# Patient Record
Sex: Female | Born: 1940 | Race: Asian | Hispanic: No | Smoking: Never smoker
Health system: Southern US, Community
[De-identification: ages and names within clinical notes are randomized; demographics above are authoritative.]

## PROBLEM LIST (undated history)

## (undated) DIAGNOSIS — C50919 Malignant neoplasm of unspecified site of unspecified female breast: Secondary | ICD-10-CM

## (undated) DIAGNOSIS — I10 Essential (primary) hypertension: Secondary | ICD-10-CM

## (undated) DIAGNOSIS — C801 Malignant (primary) neoplasm, unspecified: Secondary | ICD-10-CM

## (undated) DIAGNOSIS — E119 Type 2 diabetes mellitus without complications: Secondary | ICD-10-CM

## (undated) DIAGNOSIS — H269 Unspecified cataract: Secondary | ICD-10-CM

## (undated) DIAGNOSIS — E785 Hyperlipidemia, unspecified: Secondary | ICD-10-CM

## (undated) DIAGNOSIS — E079 Disorder of thyroid, unspecified: Secondary | ICD-10-CM

## (undated) HISTORY — PX: EYE SURGERY: SHX253

## (undated) HISTORY — DX: Type 2 diabetes mellitus without complications: E11.9

## (undated) HISTORY — DX: Disorder of thyroid, unspecified: E07.9

## (undated) HISTORY — PX: BREAST SURGERY: SHX581

## (undated) HISTORY — DX: Essential (primary) hypertension: I10

## (undated) HISTORY — DX: Malignant neoplasm of unspecified site of unspecified female breast: C50.919

## (undated) HISTORY — DX: Hyperlipidemia, unspecified: E78.5

## (undated) HISTORY — DX: Malignant (primary) neoplasm, unspecified: C80.1

## (undated) HISTORY — DX: Unspecified cataract: H26.9

---

## 2017-06-27 ENCOUNTER — Ambulatory Visit: Payer: Self-pay | Admitting: Adult Health Nurse Practitioner

## 2017-06-27 DIAGNOSIS — Z0000 Encounter for general adult medical examination without abnormal findings: Secondary | ICD-10-CM

## 2017-06-27 DIAGNOSIS — E1169 Type 2 diabetes mellitus with other specified complication: Secondary | ICD-10-CM | POA: Insufficient documentation

## 2017-06-27 DIAGNOSIS — I10 Essential (primary) hypertension: Secondary | ICD-10-CM

## 2017-06-27 DIAGNOSIS — E119 Type 2 diabetes mellitus without complications: Secondary | ICD-10-CM | POA: Insufficient documentation

## 2017-06-27 DIAGNOSIS — Z853 Personal history of malignant neoplasm of breast: Secondary | ICD-10-CM

## 2017-06-27 DIAGNOSIS — K219 Gastro-esophageal reflux disease without esophagitis: Secondary | ICD-10-CM

## 2017-06-27 DIAGNOSIS — I152 Hypertension secondary to endocrine disorders: Secondary | ICD-10-CM | POA: Insufficient documentation

## 2017-06-27 DIAGNOSIS — E785 Hyperlipidemia, unspecified: Secondary | ICD-10-CM

## 2017-06-27 DIAGNOSIS — Z515 Encounter for palliative care: Secondary | ICD-10-CM | POA: Insufficient documentation

## 2017-06-27 MED ORDER — METFORMIN HCL 1000 MG PO TABS: 500.0000 mg | ORAL_TABLET | Freq: Two times a day (BID) | ORAL | 3 refills | Status: DC

## 2017-06-27 MED ORDER — PROPRANOLOL HCL 20 MG PO TABS: 20.0000 mg | ORAL_TABLET | Freq: Two times a day (BID) | ORAL | 3 refills | Status: DC

## 2017-06-27 MED ORDER — ATORVASTATIN CALCIUM 20 MG PO TABS: 20.0000 mg | ORAL_TABLET | Freq: Every day | ORAL | 3 refills | Status: DC

## 2017-06-27 MED ORDER — AMLODIPINE BESYLATE 5 MG PO TABS: 5.0000 mg | ORAL_TABLET | Freq: Every day | ORAL | 3 refills | Status: DC

## 2017-06-27 MED ORDER — RANITIDINE HCL 150 MG PO TABS: 150.0000 mg | ORAL_TABLET | Freq: Every day | ORAL | 2 refills | Status: DC

## 2017-06-27 NOTE — Progress Notes (Signed)
  Patient: Fabiola Mudgett Female    DOB: [birth year]   76 y.o.   MRN: [id number] Visit Date: 06/27/2017  Today's Provider: Staci Acosta, NP   No chief complaint on file.  Subjective:    HPI  Here to establish care.  No current complaints.  Taking medications as directed,   Denies dizziness, HA, CP or N/V/D.     No Known Allergies This SmartLink is deprecated. Use AVSMEDLIST instead to display the medication list for a patient.  Review of Systems  All other systems reviewed and are negative.   Social History   Tobacco Use  . Smoking status: Never Smoker  . Smokeless tobacco: Never Used  Substance Use Topics  . Alcohol use: No    Frequency: Never   Objective:   BP 130/65 (BP Location: Left Arm, Patient Position: Sitting)   Pulse 79   Temp 98.1 F (36.7 C) (Oral)   Wt 105 lb 4.8 oz (47.8 kg)   Physical Exam  Constitutional: She appears well-developed and well-nourished.  HENT:  Head: Normocephalic and atraumatic.  Neck: Normal range of motion. Neck supple.  Cardiovascular: Normal rate, regular rhythm, normal heart sounds and intact distal pulses.  Pulmonary/Chest: Effort normal and breath sounds normal.  Abdominal: Soft. Bowel sounds are normal.  Skin: Skin is warm and dry.  Vitals reviewed.       Assessment & Plan:     DM:  A1c today.  Encourage diabetic diet and exercise.  Continue current medication regimen.   HTN:  Controlled. .  Goal BP <140/90.  Continue current medication regimen.  Encourage low salt diet and exercise.   HLD:  Lipid panel today.  Continue current regimen.  Encourage low cholesterol, low fat diet and exercise.   GERD:  Continue current regimen.  Avoid triggers.   FU in 4 weeks for lab review and to review medications changes.   Pt was on clinidipime 10mg  BID (per family report this is a CCB)- unknown medication, replaced with Norvasc 5 QD which is also a CCB.   Propanolol was being dosed at 40mg  daily-  switched to 20mg  BID.        Referral to [facility] for Zoloft.   Staci Acosta, NP   [facility]

## 2017-06-28 LAB — COMPREHENSIVE METABOLIC PANEL
A/G RATIO: 1.4 (ref 1.2–2.2)
ALK PHOS: 45 IU/L (ref 39–117)
ALT: 21 IU/L (ref 0–32)
AST: 23 IU/L (ref 0–40)
Albumin: 4.1 g/dL (ref 3.5–4.8)
BUN/Creatinine Ratio: 15 (ref 12–28)
BUN: 12 mg/dL (ref 8–27)
CHLORIDE: 99 mmol/L (ref 96–106)
CO2: 25 mmol/L (ref 20–29)
Calcium: 9.8 mg/dL (ref 8.7–10.3)
Creatinine, Ser: 0.79 mg/dL (ref 0.57–1.00)
GFR calc Af Amer: 84 mL/min/{1.73_m2} (ref >59)
GFR, EST NON AFRICAN AMERICAN: 72 mL/min/{1.73_m2} (ref >59)
GLOBULIN, TOTAL: 3 g/dL (ref 1.5–4.5)
Glucose: 112 mg/dL — ABNORMAL HIGH (ref 65–99)
POTASSIUM: 5.5 mmol/L — AB (ref 3.5–5.2)
SODIUM: 135 mmol/L (ref 134–144)
Total Protein: 7.1 g/dL (ref 6.0–8.5)

## 2017-06-28 LAB — CBC
HEMATOCRIT: 37.4 % (ref 34.0–46.6)
Hemoglobin: 12.1 g/dL (ref 11.1–15.9)
MCH: 28.5 pg (ref 26.6–33.0)
MCHC: 32.4 g/dL (ref 31.5–35.7)
MCV: 88 fL (ref 79–97)
Platelets: 318 10*3/uL (ref 150–379)
RBC: 4.24 x10E6/uL (ref 3.77–5.28)
RDW: 13.3 % (ref 12.3–15.4)
WBC: 10.4 10*3/uL (ref 3.4–10.8)

## 2017-06-28 LAB — LIPID PANEL
CHOL/HDL RATIO: 3 ratio (ref 0.0–4.4)
CHOLESTEROL TOTAL: 133 mg/dL (ref 100–199)
HDL: 45 mg/dL (ref >39)
LDL Calculated: 56 mg/dL (ref 0–99)
TRIGLYCERIDES: 158 mg/dL — AB (ref 0–149)
VLDL Cholesterol Cal: 32 mg/dL (ref 5–40)

## 2017-06-28 LAB — TSH: TSH: 5.09 u[IU]/mL — AB (ref 0.450–4.500)

## 2017-06-28 LAB — HEMOGLOBIN A1C
Est. average glucose Bld gHb Est-mCnc: 123 mg/dL
Hgb A1c MFr Bld: 5.9 % — ABNORMAL HIGH (ref 4.8–5.6)

## 2017-07-25 ENCOUNTER — Ambulatory Visit: Payer: Medicaid Other | Admitting: Family Medicine

## 2017-07-25 VITALS — BP 126/64 | HR 81 | Temp 98.2°F | Wt 104.2 lb

## 2017-07-25 DIAGNOSIS — E039 Hypothyroidism, unspecified: Secondary | ICD-10-CM

## 2017-07-25 DIAGNOSIS — E1169 Type 2 diabetes mellitus with other specified complication: Secondary | ICD-10-CM

## 2017-07-25 DIAGNOSIS — Z09 Encounter for follow-up examination after completed treatment for conditions other than malignant neoplasm: Secondary | ICD-10-CM

## 2017-07-25 DIAGNOSIS — I10 Essential (primary) hypertension: Secondary | ICD-10-CM

## 2017-07-25 DIAGNOSIS — E785 Hyperlipidemia, unspecified: Secondary | ICD-10-CM

## 2017-07-25 DIAGNOSIS — E119 Type 2 diabetes mellitus without complications: Secondary | ICD-10-CM

## 2017-07-25 MED ORDER — LEVOTHYROXINE SODIUM 25 MCG PO TABS: ORAL_TABLET | ORAL | 1 refills | Status: DC

## 2017-07-25 NOTE — Progress Notes (Signed)
  Patient: Erin Wagner Female    DOB: 1941-02-28   77 y.o.   MRN: [id number] Visit Date: 07/25/2017  Today's Provider: Bertha   Chief Complaint  Patient presents with  . Follow-up    blood test results   Subjective:    HPI   Lab results. Patient has no complaints at this time.     No Known Allergies Previous Medications   AMLODIPINE (NORVASC) 5 MG TABLET    Take 1 tablet (5 mg total) by mouth daily.   ATORVASTATIN (LIPITOR) 20 MG TABLET    Take 1 tablet (20 mg total) by mouth daily.   METFORMIN (GLUCOPHAGE) 1000 MG TABLET    Take 0.5 tablets (500 mg total) by mouth 2 (two) times daily with a meal.   PROPRANOLOL (INDERAL) 20 MG TABLET    Take 1 tablet (20 mg total) by mouth 2 (two) times daily.   RANITIDINE (ZANTAC) 150 MG TABLET    Take 1 tablet (150 mg total) by mouth at bedtime.   SERTRALINE (ZOLOFT) 25 MG TABLET    Take 1 tablet (25 mg total) by mouth daily.    Review of Systems  All other systems reviewed and are negative.   Social History   Tobacco Use  . Smoking status: Never Smoker  . Smokeless tobacco: Never Used  Substance Use Topics  . Alcohol use: No    Frequency: Never   Objective:   BP 126/64 (BP Location: Left Arm, Patient Position: Sitting, Cuff Size: Normal)   Pulse 81   Temp 98.2 F (36.8 C)   Wt 104 lb 3.2 oz (47.3 kg)   Physical Exam  Constitutional: She is oriented to person, place, and time. She appears well-developed and well-nourished.  HENT:  Head: Normocephalic and atraumatic.  Right Ear: External ear normal.  Left Ear: External ear normal.  Mouth/Throat: Oropharynx is clear and moist.  Eyes: Conjunctivae and EOM are normal. Pupils are equal, round, and reactive to light.  Neck: Normal range of motion. Neck supple.  Cardiovascular: Normal rate, regular rhythm, normal heart sounds and intact distal pulses.  Pulmonary/Chest: Effort normal and breath sounds normal.  Abdominal: Soft. Bowel sounds are normal.   Musculoskeletal: Normal range of motion.  Neurological: She is alert and oriented to person, place, and time. She has normal reflexes.  Skin: Skin is warm and dry.  Psychiatric: She has a normal mood and affect. Her behavior is normal. Judgment and thought content normal.       Assessment & Plan:   1. Essential hypertension BP is 126/64 today. Continue BP meds as directed.   2. Hypothyroidism, unspecified type Rx for Synthroid 12.4 mcg daily to pharmacy. Monitor in 1 month.  - TSH  3. Hyperlipidemia associated with type 2 diabetes mellitus (HCC) Stable. Continue Lipitor as directed. Monitor.   4. Type 2 diabetes mellitus without complication, without long-term current use of insulin (HCC) Continue Glucophage as directed.   5. Follow up Follow up in 3 months.     [facility]

## 2017-08-27 ENCOUNTER — Other Ambulatory Visit: Payer: Medicaid Other

## 2017-08-27 DIAGNOSIS — E119 Type 2 diabetes mellitus without complications: Secondary | ICD-10-CM

## 2017-08-28 LAB — TSH: TSH: 3.95 u[IU]/mL (ref 0.450–4.500)

## 2017-09-03 ENCOUNTER — Ambulatory Visit: Payer: Medicaid Other | Admitting: Urology

## 2017-09-03 ENCOUNTER — Ambulatory Visit: Payer: Medicaid Other | Admitting: Family Medicine

## 2017-09-03 VITALS — BP 126/62 | HR 79 | Temp 98.0°F | Wt 106.3 lb

## 2017-09-03 DIAGNOSIS — E119 Type 2 diabetes mellitus without complications: Secondary | ICD-10-CM

## 2017-09-03 LAB — GLUCOSE, POCT (MANUAL RESULT ENTRY): POC GLUCOSE: 94 mg/dL (ref 70–99)

## 2017-09-03 MED ORDER — PROPRANOLOL HCL 20 MG PO TABS: 20.0000 mg | ORAL_TABLET | Freq: Two times a day (BID) | ORAL | 3 refills | Status: DC

## 2017-09-03 MED ORDER — LEVOTHYROXINE SODIUM 25 MCG PO TABS: ORAL_TABLET | ORAL | 1 refills | Status: DC

## 2017-09-03 MED ORDER — ATORVASTATIN CALCIUM 20 MG PO TABS: 20.0000 mg | ORAL_TABLET | Freq: Every day | ORAL | 3 refills | Status: DC

## 2017-09-03 MED ORDER — AMLODIPINE BESYLATE 5 MG PO TABS: 5.0000 mg | ORAL_TABLET | Freq: Every day | ORAL | 3 refills | Status: DC

## 2017-09-03 MED ORDER — RANITIDINE HCL 150 MG PO TABS: 150.0000 mg | ORAL_TABLET | Freq: Every day | ORAL | 2 refills | Status: DC

## 2017-09-03 MED ORDER — METFORMIN HCL 1000 MG PO TABS: 500.0000 mg | ORAL_TABLET | Freq: Two times a day (BID) | ORAL | 3 refills | Status: DC

## 2017-09-03 MED ORDER — CETIRIZINE HCL 10 MG PO TABS: 10.0000 mg | ORAL_TABLET | Freq: Every day | ORAL | 1 refills | Status: DC

## 2017-09-03 NOTE — Progress Notes (Signed)
   Subjective:    Patient ID: Alanya Vukelich, female    DOB: 08/22/1940, 77 y.o.   MRN: [id number]  HPI   Carlyn Lemke is a 77 yo female here for 1 mo f/u for lab results. Pt is here with family member who is interpreting.  Pt presents with complaint of cold with cough. Denies fevers. Family member reports it's probably allergy related b/c her previous allergy med was from [place].  Pt reports reflux is fine.   Pt needs med refill.  Pt did not go to [facility] for depression symptoms in Dec 2018. Family member says Pt was depressed from being away from home but she is doing better and does not need Zoloft rx  Patient Active Problem List   Diagnosis Date Noted  . Health care maintenance 06/27/2017  . History of breast cancer 06/27/2017  . Diabetes mellitus without complication ([place]) 62/56/3893  . Hyperlipidemia associated with type 2 diabetes mellitus ([place]) 06/27/2017  . GERD (gastroesophageal reflux disease) 06/27/2017  . Hypertension 06/27/2017   Allergies as of 09/03/2017   No Known Allergies     Medication List        Accurate as of 09/03/17  7:30 PM. Always use your most recent med list.          amLODipine 5 MG tablet Commonly known as:  NORVASC Take 1 tablet (5 mg total) by mouth daily.   atorvastatin 20 MG tablet Commonly known as:  LIPITOR Take 1 tablet (20 mg total) by mouth daily.   levothyroxine 25 MCG tablet Commonly known as:  SYNTHROID, LEVOTHROID Take 1/2 tablet daily   metFORMIN 1000 MG tablet Commonly known as:  GLUCOPHAGE Take 0.5 tablets (500 mg total) by mouth 2 (two) times daily with a meal.   propranolol 20 MG tablet Commonly known as:  INDERAL Take 1 tablet (20 mg total) by mouth 2 (two) times daily.   ranitidine 150 MG tablet Commonly known as:  ZANTAC Take 1 tablet (150 mg total) by mouth at bedtime.   ZOLOFT 25 MG tablet Generic drug:  sertraline Take 1 tablet (25 mg total) by mouth daily.        Review of Systems Reviewed  labs. TSH is at target.  Pt is taking all meds as rx.  Diabetes is controlled.     Objective:   Physical Exam  Constitutional: She is oriented to person, place, and time. She appears well-developed and well-nourished.  Cardiovascular: Normal rate, regular rhythm and normal heart sounds.  Pulmonary/Chest: Effort normal and breath sounds normal.  Neurological: She is alert and oriented to person, place, and time.  Vitals reviewed.   BP 126/62   Pulse 79   Temp 98 F (36.7 C)   Wt 106 lb 4.8 oz (48.2 kg)   Lab Results  Component Value Date   POCGLU 94 09/03/2017        Assessment & Plan:   Refilled meds. Ordered Zyrtec 10mg .  F/u in 1 mo with labs: A1c,

## 2017-09-05 ENCOUNTER — Other Ambulatory Visit: Payer: Medicaid Other

## 2017-09-09 ENCOUNTER — Ambulatory Visit (INDEPENDENT_AMBULATORY_CARE_PROVIDER_SITE_OTHER): Payer: Medicaid Other | Admitting: Family Medicine

## 2017-09-09 ENCOUNTER — Encounter: Payer: Self-pay | Admitting: Family Medicine

## 2017-09-09 VITALS — BP 130/60 | HR 76 | Temp 97.9°F | Resp 16 | Ht <= 58 in | Wt 104.8 lb

## 2017-09-09 DIAGNOSIS — Z8669 Personal history of other diseases of the nervous system and sense organs: Secondary | ICD-10-CM

## 2017-09-09 DIAGNOSIS — M25422 Effusion, left elbow: Secondary | ICD-10-CM

## 2017-09-09 DIAGNOSIS — E119 Type 2 diabetes mellitus without complications: Secondary | ICD-10-CM

## 2017-09-09 DIAGNOSIS — E785 Hyperlipidemia, unspecified: Secondary | ICD-10-CM

## 2017-09-09 DIAGNOSIS — K219 Gastro-esophageal reflux disease without esophagitis: Secondary | ICD-10-CM | POA: Diagnosis not present

## 2017-09-09 DIAGNOSIS — Z853 Personal history of malignant neoplasm of breast: Secondary | ICD-10-CM | POA: Diagnosis not present

## 2017-09-09 DIAGNOSIS — I10 Essential (primary) hypertension: Secondary | ICD-10-CM | POA: Diagnosis not present

## 2017-09-09 DIAGNOSIS — F329 Major depressive disorder, single episode, unspecified: Secondary | ICD-10-CM

## 2017-09-09 DIAGNOSIS — E1169 Type 2 diabetes mellitus with other specified complication: Secondary | ICD-10-CM | POA: Diagnosis not present

## 2017-09-09 DIAGNOSIS — F32A Depression, unspecified: Secondary | ICD-10-CM | POA: Insufficient documentation

## 2017-09-09 NOTE — Assessment & Plan Note (Signed)
Well-controlled Continue current medications Recent CMP reviewed Follow-up in 3 months

## 2017-09-09 NOTE — Assessment & Plan Note (Signed)
Well-controlled Last lipid panel reviewed and LDL less than 70 Continue atorvastatin 20 mg daily

## 2017-09-09 NOTE — Assessment & Plan Note (Signed)
Does have some tenderness over medial could represent medial epicondylitis, but swelling is in an unusual pattern for this She does have a mobile mass in her medial anterior elbow. Check ultrasound to evaluate further Pending results, consider further imaging or referral Advised patient and her daughter that it is okay to take NSAIDs or Tylenol as needed for pain and ice regularly

## 2017-09-09 NOTE — Assessment & Plan Note (Signed)
Well-controlled, last A1c 5.9 Continue metformin Foot exam and urine microalbumin at next visit Referral to ophthalmology for eye exam We will update vaccines at next visit Follow-up in 3 months

## 2017-09-09 NOTE — Assessment & Plan Note (Signed)
Well-controlled currently Continue ranitidine

## 2017-09-09 NOTE — Assessment & Plan Note (Signed)
Doing well PHQ 9 score of 0 Continue low-dose Zoloft

## 2017-09-09 NOTE — Progress Notes (Signed)
Patient: Erin Wagner, Female    DOB: 25-Jun-1941, 77 y.o.   MRN: [id number] Visit Date: 09/09/2017  Today's Provider: Lavon Paganini, MD   I, Erin Wagner, CMA, am acting as scribe for Erin Paganini, MD.  Chief Complaint  Patient presents with  . Establish Care   Subjective:    Erin Wagner is a 77 y.o. female who presents today for health maintenance and to establish care. She feels fairly well. She is experiencing swelling of her left antecubital space. She reports exercising none. She reports she is sleeping fairly well.  Erin Wagner was previously seen by [facility] for her primary care. Her PMH includes type 2 DM, HTN, hyperlipidemia, GERD, hypothyroidism.  Reviewed recent labs from [facility], which reveals normal Cr, LFTs, CBC, TSH, cholesterol.  Her preferred language is [place]. She declines phone interpreter, and daughter is present for interpretation. Daughter helps provide some of the history. ----------------------------------------------------------------- Treated for breast cancer in [place]. 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  Graddaughter is a physician and sent in summary of treatment (see photo below) which included chemo, radiation, and mastectomy.  She is currently on Tamoxifen daily.      Moved from [place] one year ago.  Swelling in L medial elbow: Started 1 wk ago. Sometimes painful but not always.  Nothing makes it worse or better.  Doesn't restrict motion or strength. Hasnt tried medications/ice. No fevers, redness, other lesions, growth.  T2DM: Last A1c 5.9. Taking Metformin 500mg  BID. Denies symptoms of hypoglycemia, polyuria, polydipsia, numbness extremities, foot ulcers/trauma. Denies side effects of metformin.  Does not have an ophthalmologist.  Not checking CBGs at home.  HTN: - Medications: amlodipine 5mg  daily, propranolol 20mg  BID - Compliance:  good - Checking BP at home: no - Denies any SOB, CP, vision changes, LE edema, medication SEs, or symptoms of hypotension - Diet: low sodium  Hypothyroidism: Taking Synthroid 12.51mcg daily.  Last TSH wnl.  Denies hair loss, dry skin, constipaiton/diarrhea, heat/cold intolerance.  HLD: Taking Atorvastatin 20mg  daily without complications. Last LDL <70.  GERD: Reportedly well controlled on Ranitidine  Depression: Denies any anhedonia or depression. Taking Zoloft without issue. Has been widowed for many years.    Review of Systems  Constitutional: Negative.   HENT: Negative.   Eyes: Negative.   Respiratory: Negative.   Cardiovascular: Negative.   Gastrointestinal: Negative.   Endocrine: Negative.   Genitourinary: Negative.   Musculoskeletal: Negative.   Skin: Negative.   Allergic/Immunologic: Negative.   Neurological: Negative.   Hematological: Negative.   Psychiatric/Behavioral: Negative.     Social History      She  reports that  has never smoked. she has never used smokeless tobacco. She reports that she does not drink alcohol or use drugs.       Social History   Socioeconomic History  . Marital status: Widowed    Spouse name: None  . Number of children: 4  . Years of education: None  . Highest education level: None  Social Needs  . Financial resource strain: None  . Food insecurity - worry: None  . Food insecurity - inability: None  . Transportation needs - medical: None  . Transportation needs - non-medical: None  Occupational History    Employer: retired    Comment: homemaker  Tobacco Use  . Smoking status: Never Smoker  . Smokeless tobacco: Never Used  Substance and Sexual Activity  .  Alcohol use: No    Frequency: Never  . Drug use: No  . Sexual activity: None  Other Topics Concern  . None  Social History Narrative  . None    Past Medical History:  Diagnosis Date  . Cancer ([place])    breast  . Cataract   . Diabetes mellitus without complication  ([place])   . Hyperlipidemia   . Hypertension   . Thyroid disease      Patient Active Problem List   Diagnosis Date Noted  . Swelling of left elbow 09/09/2017  . History of cataract 09/09/2017  . Depression 09/09/2017  . Health care maintenance 06/27/2017  . History of breast cancer 06/27/2017  . Diabetes mellitus without complication ([place]) 40/98/1191  . Hyperlipidemia associated with type 2 diabetes mellitus ([place]) 06/27/2017  . GERD (gastroesophageal reflux disease) 06/27/2017  . Hypertension 06/27/2017    Past Surgical History:  Procedure Laterality Date  . BREAST SURGERY    . EYE SURGERY      Family History        Family Status  Relation Name Status  . Mother  Deceased  . Father  Deceased  . Sister  Alive  . Brother  Alive  . Daughter  Alive  . Son  Alive  . Sister  Alive  . Daughter  Alive  . Daughter  Alive        Her family history includes Healthy in her brother, daughter, daughter, daughter, sister, and sister.      No Known Allergies   Current Outpatient Medications:  .  amLODipine (NORVASC) 5 MG tablet, Take 1 tablet (5 mg total) by mouth daily., Disp: 30 tablet, Rfl: 3 .  atorvastatin (LIPITOR) 20 MG tablet, Take 1 tablet (20 mg total) by mouth daily., Disp: 30 tablet, Rfl: 3 .  levothyroxine (SYNTHROID, LEVOTHROID) 25 MCG tablet, Take 1/2 tablet daily, Disp: 15 tablet, Rfl: 1 .  metFORMIN (GLUCOPHAGE) 1000 MG tablet, Take 0.5 tablets (500 mg total) by mouth 2 (two) times daily with a meal., Disp: 60 tablet, Rfl: 3 .  propranolol (INDERAL) 20 MG tablet, Take 1 tablet (20 mg total) by mouth 2 (two) times daily., Disp: 30 tablet, Rfl: 3 .  ranitidine (ZANTAC) 150 MG tablet, Take 1 tablet (150 mg total) by mouth at bedtime., Disp: 30 tablet, Rfl: 2 .  sertraline (ZOLOFT) 25 MG tablet, Take 25 mg by mouth daily., Disp: , Rfl:  .  tamoxifen (NOLVADEX) 20 MG tablet, Take 20 mg by mouth daily., Disp: , Rfl:  .  cetirizine (ZYRTEC) 10 MG tablet, Take 1 tablet (10  mg total) by mouth daily. (Patient not taking: Reported on 09/09/2017), Disp: 30 tablet, Rfl: 1   Patient Care Team: Virginia Crews, MD as PCP - General (Family Medicine)      Objective:   Vitals: BP 130/60 (BP Location: Left Arm, Patient Position: Sitting, Cuff Size: Normal)   Pulse 76   Temp 97.9 F (36.6 C) (Oral)   Resp 16   Ht 4\' 6"  (1.372 m)   Wt 104 lb 12.8 oz (47.5 kg)   SpO2 97%   BMI 25.27 kg/m    Vitals:   09/09/17 0907  BP: 130/60  Pulse: 76  Resp: 16  Temp: 97.9 F (36.6 C)  TempSrc: Oral  SpO2: 97%  Weight: 104 lb 12.8 oz (47.5 kg)  Height: 4\' 6"  (1.372 m)     Physical Exam  Constitutional: She is oriented to person, place, and time. She appears  well-developed and well-nourished. No distress.  HENT:  Head: Normocephalic and atraumatic.  Right Ear: External ear normal.  Left Ear: External ear normal.  Nose: Nose normal.  Mouth/Throat: Oropharynx is clear and moist.  Eyes: Conjunctivae and EOM are normal. Pupils are equal, round, and reactive to light. No scleral icterus.  Neck: Neck supple. No thyromegaly present.  Cardiovascular: Normal rate, regular rhythm, normal heart sounds and intact distal pulses.  No murmur heard. Pulmonary/Chest: Breath sounds normal. No respiratory distress. She has no wheezes. She has no rales.  Abdominal: Soft. Bowel sounds are normal. She exhibits no distension. There is no tenderness. There is no rebound and no guarding.  Musculoskeletal: She exhibits no edema.  Left elbow: Range of motion and strength intact.  Sensation intact to light touch.  3 cm round raised, mobile lesion over medial anterior left elbow.  Some tenderness to palpation over medial epicondyles, but minimal.  No tenderness to palpation over mass.  Lymphadenopathy:    She has no cervical adenopathy.  Neurological: She is alert and oriented to person, place, and time.  Skin: Skin is warm and dry. No rash noted.  Psychiatric: She has a normal mood and  affect. Her behavior is normal.  Vitals reviewed.    Depression Screen PHQ 2/9 Scores 09/09/2017  PHQ - 2 Score 0      Assessment & Plan:    Problem List Items Addressed This Visit      Cardiovascular and Mediastinum   Hypertension - Primary    Well-controlled Continue current medications Recent CMP reviewed Follow-up in 3 months        Digestive   GERD (gastroesophageal reflux disease)    Well-controlled currently Continue ranitidine        Endocrine   Diabetes mellitus without complication ([place])    Well-controlled, last A1c 5.9 Continue metformin Foot exam and urine microalbumin at next visit Referral to ophthalmology for eye exam We will update vaccines at next visit Follow-up in 3 months      Relevant Orders   Ambulatory referral to Ophthalmology   Hyperlipidemia associated with type 2 diabetes mellitus ([place])    Well-controlled Last lipid panel reviewed and LDL less than 70 Continue atorvastatin 20 mg daily        Other   History of breast cancer    From family's information, it seems that patient was treated in [place] with bilateral mastectomies and chemo and radiation She is on tamoxifen and presumably in remission, but has not seen an oncologist since she moved to the [place] about 1 year ago We will refer to oncology for further management and follow-up As she is on tamoxifen, she likely needs a bone density scan      Relevant Orders   Ambulatory referral to Oncology   Swelling of left elbow    Does have some tenderness over medial could represent medial epicondylitis, but swelling is in an unusual pattern for this She does have a mobile mass in her medial anterior elbow. Check ultrasound to evaluate further Pending results, consider further imaging or referral Advised patient and her daughter that it is okay to take NSAIDs or Tylenol as needed for pain and ice regularly      Relevant Orders   US SOFT TISSUE UPPER EXTREMITY LIMITED LEFT  (NON-VASCULAR)   History of cataract   Relevant Orders   Ambulatory referral to Ophthalmology   Depression    Doing well PHQ 9 score of 0 Continue low-dose Zoloft  Relevant Medications   sertraline (ZOLOFT) 25 MG tablet       Return in about 3 months (around 12/10/2017) for diabetes, HTN, HLD f/u.   The entirety of the information documented in the History of Present Illness, Review of Systems and Physical Exam were personally obtained by me. Portions of this information were initially documented by Raquel Sarna Ratchford, CMA and reviewed by me for thoroughness and accuracy.    Virginia Crews, MD, MPH [facility] 09/09/2017 10:48 AM

## 2017-09-09 NOTE — Assessment & Plan Note (Signed)
From family's information, it seems that patient was treated in [place] with bilateral mastectomies and chemo and radiation She is on tamoxifen and presumably in remission, but has not seen an oncologist since she moved to the [place] about 1 year ago We will refer to oncology for further management and follow-up As she is on tamoxifen, she likely needs a bone density scan

## 2017-09-12 ENCOUNTER — Other Ambulatory Visit: Payer: Medicaid Other

## 2017-09-12 ENCOUNTER — Ambulatory Visit
Admission: RE | Admit: 2017-09-12 | Discharge: 2017-09-12 | Disposition: A | Discharge status: Home / Self Care | Payer: Medicaid Other | Source: Ambulatory Visit | Attending: Family Medicine | Admitting: Family Medicine

## 2017-09-12 DIAGNOSIS — M25422 Effusion, left elbow: Secondary | ICD-10-CM | POA: Diagnosis present

## 2017-09-12 DIAGNOSIS — M7989 Other specified soft tissue disorders: Secondary | ICD-10-CM | POA: Insufficient documentation

## 2017-09-12 DIAGNOSIS — Z9013 Acquired absence of bilateral breasts and nipples: Secondary | ICD-10-CM | POA: Diagnosis not present

## 2017-09-19 ENCOUNTER — Ambulatory Visit: Payer: Medicaid Other

## 2017-09-20 ENCOUNTER — Ambulatory Visit: Payer: Self-pay | Admitting: Oncology

## 2017-09-24 ENCOUNTER — Inpatient Hospital Stay: Payer: Medicaid Other | Attending: Oncology | Admitting: Oncology

## 2017-09-24 ENCOUNTER — Encounter: Payer: Self-pay | Admitting: Oncology

## 2017-09-24 VITALS — BP 122/67 | HR 79 | Temp 97.3°F | Resp 18 | Ht <= 58 in | Wt 105.9 lb

## 2017-09-24 DIAGNOSIS — E785 Hyperlipidemia, unspecified: Secondary | ICD-10-CM | POA: Diagnosis not present

## 2017-09-24 DIAGNOSIS — K219 Gastro-esophageal reflux disease without esophagitis: Secondary | ICD-10-CM

## 2017-09-24 DIAGNOSIS — Z780 Asymptomatic menopausal state: Secondary | ICD-10-CM

## 2017-09-24 DIAGNOSIS — E119 Type 2 diabetes mellitus without complications: Secondary | ICD-10-CM

## 2017-09-24 DIAGNOSIS — Z79899 Other long term (current) drug therapy: Secondary | ICD-10-CM | POA: Diagnosis not present

## 2017-09-24 DIAGNOSIS — Z9012 Acquired absence of left breast and nipple: Secondary | ICD-10-CM | POA: Insufficient documentation

## 2017-09-24 DIAGNOSIS — Z853 Personal history of malignant neoplasm of breast: Secondary | ICD-10-CM | POA: Diagnosis present

## 2017-09-24 DIAGNOSIS — Z7984 Long term (current) use of oral hypoglycemic drugs: Secondary | ICD-10-CM | POA: Insufficient documentation

## 2017-09-24 DIAGNOSIS — R609 Edema, unspecified: Secondary | ICD-10-CM | POA: Diagnosis not present

## 2017-09-24 DIAGNOSIS — C50911 Malignant neoplasm of unspecified site of right female breast: Secondary | ICD-10-CM

## 2017-09-24 DIAGNOSIS — I10 Essential (primary) hypertension: Secondary | ICD-10-CM | POA: Diagnosis not present

## 2017-09-24 DIAGNOSIS — F329 Major depressive disorder, single episode, unspecified: Secondary | ICD-10-CM | POA: Diagnosis not present

## 2017-09-24 DIAGNOSIS — Z9221 Personal history of antineoplastic chemotherapy: Secondary | ICD-10-CM | POA: Insufficient documentation

## 2017-09-24 NOTE — Progress Notes (Signed)
Hematology/Oncology Consult note [facility] Telephone:([phone] [phone]  Patient Care Team: Virginia Crews, MD as PCP - General (Family Medicine)   Name of the patient: Erin Wagner  [id number]  1941-04-09    Reason for referral- h/o b/l breast cancer treated in [place]   Referring physician- Dr. Brita Romp  Date of visit: 09/24/17   History of presenting illness- Patient is a 77 year old [place]  speaking female from [place] with a past medical history significant for hypertension hyperlipidemia, diabetes and bilateral breast cancer.  She is here with son and daughter in law. She has refused formal language interpretor and would like her family to interpret for her. Her family is comfortable speaking in Hindi with me and translating for her.  Her oncology history is as follows:  1.  She was diagnosed with bilateral breast cancer in July 2017 in [place].  She initially received neoadjuvant chemotherapy with 3 cycles of epirubicin and cyclophosphamide.  This was followed by bilateral modified radical mastectomy.  I do not have the actual pathology report from [place].  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  I am unable to decipher the written handwriting well.  Appears to have 1 out of 13 lymph nodes positive with PNE positive, no LV I or perineural invasion.  Left breast mass showed 6 cm mixed infiltrating ductal and lobular histology.  Again handwritten notes mention 3 out of 16 lymph nodes positive.  In some other page on the outside report, the right breast cancer has been mentioned as T2 N0 in the left breast cancer has been mentioned as T4N0.  2.  Following surgery she had 1 cycle of epirubicin and cyclophosphamide and 2 cycles of doxorubicin.  She then had IMRT to her bilateral breasts over 5 weeks.  She was then asked to take  tamoxifen for 6 months only.  There is no mention about ER PR and HER-2 status anywhere in the outside reports. Patient was asked to take tamoxifen for 6 months following radiation and then stop it. She is currently not taking any hormonal therapy  3. 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 Currently patients has relocated to [place] and plans to stay in [place]. She reports doing well. She has occasional pain in her left axilla. She denies other complaints. Her appetite good. No unintentional weight loss. She is independent of her ADL's.   ECOG PS- 1  Pain scale- 0   Review of systems- Review of Systems  Constitutional: Negative for chills, fever, malaise/fatigue and weight loss.  HENT: Negative for congestion, ear discharge and nosebleeds.   Eyes: Negative for blurred vision.  Respiratory: Negative for cough, hemoptysis, sputum production, shortness of breath and wheezing.   Cardiovascular: Negative for chest pain, palpitations, orthopnea and claudication.  Gastrointestinal: Negative for abdominal pain, blood in stool, constipation, diarrhea, heartburn, melena, nausea and vomiting.  Genitourinary: Negative for dysuria, flank pain, frequency, hematuria and urgency.  Musculoskeletal: Negative for back pain, joint pain and myalgias.  Skin: Negative for rash.  Neurological: Negative for dizziness, tingling, focal weakness, seizures, weakness and headaches.  Endo/Heme/Allergies: Does not bruise/bleed easily.  Psychiatric/Behavioral: Negative for depression and suicidal ideas. The patient does not have insomnia.     No Known Allergies  Patient  Active Problem List   Diagnosis Date Noted  . Swelling of left elbow 09/09/2017  . History of cataract 09/09/2017  . Depression 09/09/2017  . Health care maintenance 06/27/2017  . History of breast cancer  06/27/2017  . Diabetes mellitus without complication ([place]) 18/84/1660  . Hyperlipidemia associated with type 2 diabetes mellitus ([place]) 06/27/2017  . GERD (gastroesophageal reflux disease) 06/27/2017  . Hypertension 06/27/2017     Past Medical History:  Diagnosis Date  . Cancer ([place])    breast  . Cataract   . Diabetes mellitus without complication ([place])   . Hyperlipidemia   . Hypertension   . Thyroid disease      Past Surgical History:  Procedure Laterality Date  . BREAST SURGERY    . EYE SURGERY      Social History   Socioeconomic History  . Marital status: Widowed    Spouse name: Not on file  . Number of children: 4  . Years of education: Not on file  . Highest education level: Not on file  Social Needs  . Financial resource strain: Not on file  . Food insecurity - worry: Not on file  . Food insecurity - inability: Not on file  . Transportation needs - medical: Not on file  . Transportation needs - non-medical: Not on file  Occupational History    Employer: retired    Comment: homemaker  Tobacco Use  . Smoking status: Never Smoker  . Smokeless tobacco: Never Used  Substance and Sexual Activity  . Alcohol use: No    Frequency: Never  . Drug use: No  . Sexual activity: Not on file  Other Topics Concern  . Not on file  Social History Narrative  . Not on file     Family History  Problem Relation Age of Onset  . Healthy Sister   . Healthy Brother   . Healthy Daughter   . Healthy Sister   . Healthy Daughter   . Healthy Daughter      Current Outpatient Medications:  .  amLODipine (NORVASC) 5 MG tablet, Take 1 tablet (5 mg total) by mouth daily., Disp: 30 tablet, Rfl: 3 .  atorvastatin (LIPITOR) 20 MG tablet, Take 1 tablet (20 mg total) by mouth daily., Disp: 30 tablet, Rfl: 3 .  cetirizine (ZYRTEC) 10 MG tablet, Take 1 tablet (10 mg total) by mouth daily., Disp: 30 tablet, Rfl: 1 .  levothyroxine (SYNTHROID, LEVOTHROID) 25 MCG tablet, Take 1/2 tablet  daily, Disp: 15 tablet, Rfl: 1 .  metFORMIN (GLUCOPHAGE) 1000 MG tablet, Take 0.5 tablets (500 mg total) by mouth 2 (two) times daily with a meal., Disp: 60 tablet, Rfl: 3 .  propranolol (INDERAL) 20 MG tablet, Take 1 tablet (20 mg total) by mouth 2 (two) times daily., Disp: 30 tablet, Rfl: 3 .  ranitidine (ZANTAC) 150 MG tablet, Take 1 tablet (150 mg total) by mouth at bedtime. (Patient not taking: Reported on 09/24/2017), Disp: 30 tablet, Rfl: 2 .  sertraline (ZOLOFT) 25 MG tablet, Take 25 mg by mouth daily., Disp: , Rfl:  .  tamoxifen (NOLVADEX) 20 MG tablet, Take 20 mg by mouth daily., Disp: , Rfl:    Physical exam:  Vitals:   09/24/17 1123  BP: 122/67  Pulse: 79  Resp: 18  Temp: (!) 97.3 F (36.3 C)  TempSrc: Tympanic  Weight: 105 lb 14.4 oz (48 kg)  Height: _0  (1.372 m)   Physical Exam  Constitutional: She is oriented to person, place, and time  and well-developed, well-nourished, and in no distress.  HENT:  Head: Normocephalic and atraumatic.  Eyes: EOM are normal. Pupils are equal, round, and reactive to light.  Neck: Normal range of motion.  Cardiovascular: Normal rate, regular rhythm and normal heart sounds.  Pulmonary/Chest: Effort normal and breath sounds normal.  Abdominal: Soft. Bowel sounds are normal.  Neurological: She is alert and oriented to person, place, and time.  Skin: Skin is warm and dry.   Breast exam is performed in seated and lying down position. Patient is status post bilateral mastectomy without reconstruction. No evidence of any chest wall recurrence. No evidence of bilateral axillary adenopathy      CMP Latest Ref Rng & Units 06/27/2017  Glucose 65 - 99 mg/dL 112(H)  BUN 8 - 27 mg/dL 12  Creatinine 0.57 - 1.00 mg/dL 0.79  Sodium 134 - 144 mmol/L 135  Potassium 3.5 - 5.2 mmol/L 5.5(H)  Chloride 96 - 106 mmol/L 99  CO2 20 - 29 mmol/L 25  Calcium 8.7 - 10.3 mg/dL 9.8  Total Protein 6.0 - 8.5 g/dL 7.1  Total Bilirubin 0.0 - 1.2 mg/dL <0.2    Alkaline Phos 39 - 117 IU/L 45  AST 0 - 40 IU/L 23  ALT 0 - 32 IU/L 21   CBC Latest Ref Rng & Units 06/27/2017  WBC 3.4 - 10.8 x10E3/uL 10.4  Hemoglobin 11.1 - 15.9 g/dL 12.1  Hematocrit 34.0 - 46.6 % 37.4  Platelets 150 - 379 x10E3/uL 318    No images are attached to the encounter.  US Soft Tissue Upper Extremity Limited Left (non-vascular)  Result Date: 09/12/2017 CLINICAL DATA:  Medial swelling adjacent to the elbow x1 month. History of double mastectomy within the past year with lymph node dissection. EXAM: ULTRASOUND LEFT UPPER EXTREMITY LIMITED TECHNIQUE: Ultrasound examination of the upper extremity soft tissues was performed in the area of clinical concern. COMPARISON:  None FINDINGS: Imaging over the affected area along the medial aspect of the left elbow was performed. No discrete mass or fluid collections are identified however there does appear to be mild interstitial edema noted in the area in question. IMPRESSION: Mild soft tissue edema query mild lymphedema given history of bilateral mastectomy and lymph node dissection. No discrete soft tissue mass or focal fluid collections are identified. Electronically Signed   By: Ashley Royalty M.D.   On: 09/12/2017 17:31    Assessment and plan- Patient is a 77 y.o. female with bilateral breast cancer (stage is unclear and ER PR and her 2 neu status is also unclear) s/p neoadjuvant chemotherapy followed bilateral mastectomy without reconstruction, adjuvant chemotherapy, RT and 6 months of tamoxifen  I have asked the family to get in touch with the hospital in [place] and get me the actual pathology report which mentions TN staging at the time of surgery. Also I need to see ER PR and her 2 neu status.  If they are unable to get the pathology report, given the mention of lobular carcinoma in the right breast, possible mixed histology in the left breast and lack of her 2 based therapy- I will need to assume that this is ER PR positive her 2  negative given that she received tamoxifen for 6 months. She had bilateral breast cancer with possible LN involvement. She is therefore at a high risk of recurrence without continued hormone therapy for 5-10 years if she is truly hormone positive. The benefits of hormone therapy outweigh the risks of not continuing it in the face  of unknown ER PR status  Given that she is postmenopausal I would favor 5 -10 years of adjuvant hormone therapy with aromatase inhibitor. I discussed the risks and benefits of Arimidex including all but not limited to fatigue, hypercholesterolemia, hot flashes, arthralgias and worsening bone health.  Patient will also need to be on calcium 1200 mg along with vitamin D 800 international units.  Also discussed risks and benefits of tamoxifen including all but not limited to risk of endometrial cancer, DVT and hot flashes  I will obtain a baseline bone density scan and based on her bone health, I will decide if she should go on tamoxifen or arimidex.  No role for mammogram after b/l mastectomy  I will see her back in 4 months with cbc/ cmp  but sooner in 6 weeks if I start endocrine therapy.   Total face to face encounter time for this patient visit was 45 min. >50% of the time was  spent in counseling and coordination of care.      Thank you for this kind referral and the opportunity to participate in the care of this patient   Visit Diagnosis 1. Malignant neoplasm of right female breast, unspecified estrogen receptor status, unspecified site of breast ([place])   2. Post-menopausal     Dr. Randa Evens, MD, MPH [place] [facility] Pager- [phone] 09/24/2017

## 2017-09-25 ENCOUNTER — Telehealth: Payer: Self-pay | Admitting: Oncology

## 2017-09-25 NOTE — Telephone Encounter (Signed)
Spoke with patient's son regarding Imaging study to be done on the next availale date which is in [place]. He is ok with it being scheduled there. Time/date for 10/02/17 @ 2:20 p.m. With an arrival time of 2 pm, conf with patient and son. Address given to patient and son also.

## 2017-09-26 ENCOUNTER — Encounter: Payer: Self-pay | Admitting: Family Medicine

## 2017-09-26 ENCOUNTER — Encounter: Payer: Self-pay | Admitting: Oncology

## 2017-10-02 ENCOUNTER — Inpatient Hospital Stay: Admission: RE | Admit: 2017-10-02 | Payer: Medicaid Other | Source: Ambulatory Visit

## 2017-10-09 LAB — HM DIABETES EYE EXAM

## 2017-10-10 ENCOUNTER — Encounter: Payer: Self-pay | Admitting: Family Medicine

## 2017-10-11 MED ORDER — LEVOTHYROXINE SODIUM 25 MCG PO TABS
ORAL_TABLET | ORAL | 3 refills | Status: DC
Start: 2017-10-11 — End: 2017-12-13

## 2017-10-11 MED ORDER — RANITIDINE HCL 150 MG PO TABS: 150.0000 mg | ORAL_TABLET | Freq: Every day | ORAL | 3 refills | Status: DC

## 2017-10-11 MED ORDER — SERTRALINE HCL 25 MG PO TABS: 25.0000 mg | ORAL_TABLET | Freq: Every day | ORAL | 3 refills | Status: DC

## 2017-10-11 MED ORDER — ATORVASTATIN CALCIUM 20 MG PO TABS: 20.0000 mg | ORAL_TABLET | Freq: Every day | ORAL | 3 refills | Status: DC

## 2017-10-11 MED ORDER — PROPRANOLOL HCL 20 MG PO TABS: 20.0000 mg | ORAL_TABLET | Freq: Two times a day (BID) | ORAL | 3 refills | Status: DC

## 2017-10-11 MED ORDER — AMLODIPINE BESYLATE 5 MG PO TABS: 5.0000 mg | ORAL_TABLET | Freq: Every day | ORAL | 3 refills | Status: DC

## 2017-10-11 MED ORDER — METFORMIN HCL 1000 MG PO TABS: 500.0000 mg | ORAL_TABLET | Freq: Two times a day (BID) | ORAL | 3 refills | Status: DC

## 2017-10-11 MED ORDER — CETIRIZINE HCL 10 MG PO TABS: 10.0000 mg | ORAL_TABLET | Freq: Every day | ORAL | 3 refills | Status: DC

## 2017-10-16 ENCOUNTER — Encounter: Payer: Self-pay | Admitting: Family Medicine

## 2017-10-17 MED ORDER — ATORVASTATIN CALCIUM 20 MG PO TABS: 20.0000 mg | ORAL_TABLET | Freq: Every day | ORAL | 3 refills | Status: DC

## 2017-10-17 MED ORDER — PROPRANOLOL HCL 20 MG PO TABS: 20.0000 mg | ORAL_TABLET | Freq: Two times a day (BID) | ORAL | 3 refills | Status: DC

## 2017-10-17 MED ORDER — METFORMIN HCL 1000 MG PO TABS: 500.0000 mg | ORAL_TABLET | Freq: Two times a day (BID) | ORAL | 3 refills | Status: DC

## 2017-10-17 MED ORDER — AMLODIPINE BESYLATE 5 MG PO TABS: 5.0000 mg | ORAL_TABLET | Freq: Every day | ORAL | 3 refills | Status: DC

## 2017-10-31 ENCOUNTER — Ambulatory Visit
Admission: RE | Admit: 2017-10-31 | Discharge: 2017-10-31 | Disposition: A | Discharge status: Home / Self Care | Payer: Medicaid Other | Source: Ambulatory Visit | Attending: Oncology | Admitting: Oncology

## 2017-10-31 DIAGNOSIS — C50911 Malignant neoplasm of unspecified site of right female breast: Secondary | ICD-10-CM | POA: Diagnosis present

## 2017-10-31 DIAGNOSIS — Z780 Asymptomatic menopausal state: Secondary | ICD-10-CM | POA: Insufficient documentation

## 2017-10-31 DIAGNOSIS — M81 Age-related 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 fracture: Secondary | ICD-10-CM | POA: Insufficient documentation

## 2017-12-11 ENCOUNTER — Ambulatory Visit: Payer: Self-pay | Admitting: Family Medicine

## 2017-12-13 ENCOUNTER — Encounter: Payer: Self-pay | Admitting: Family Medicine

## 2017-12-13 ENCOUNTER — Ambulatory Visit (INDEPENDENT_AMBULATORY_CARE_PROVIDER_SITE_OTHER): Payer: Medicaid Other | Admitting: Family Medicine

## 2017-12-13 VITALS — BP 122/50 | HR 81 | Temp 97.7°F | Resp 16 | Wt 105.0 lb

## 2017-12-13 DIAGNOSIS — E119 Type 2 diabetes mellitus without complications: Secondary | ICD-10-CM | POA: Diagnosis not present

## 2017-12-13 DIAGNOSIS — I890 Lymphedema, not elsewhere classified: Secondary | ICD-10-CM | POA: Insufficient documentation

## 2017-12-13 DIAGNOSIS — I10 Essential (primary) hypertension: Secondary | ICD-10-CM

## 2017-12-13 LAB — POCT GLYCOSYLATED HEMOGLOBIN (HGB A1C)
Est. average glucose Bld gHb Est-mCnc: 120
HEMOGLOBIN A1C: 5.8 % — AB (ref 4.0–5.6)

## 2017-12-13 LAB — POCT UA - MICROALBUMIN: Microalbumin Ur, POC: 0 mg/L

## 2017-12-13 MED ORDER — METFORMIN HCL 1000 MG PO TABS: 500.0000 mg | ORAL_TABLET | Freq: Two times a day (BID) | ORAL | 3 refills | Status: DC

## 2017-12-13 MED ORDER — RANITIDINE HCL 150 MG PO TABS: 150.0000 mg | ORAL_TABLET | Freq: Every day | ORAL | 3 refills | Status: DC

## 2017-12-13 MED ORDER — AMLODIPINE BESYLATE 5 MG PO TABS: 5.0000 mg | ORAL_TABLET | Freq: Every day | ORAL | 3 refills | Status: DC

## 2017-12-13 MED ORDER — LEVOTHYROXINE SODIUM 25 MCG PO TABS: ORAL_TABLET | ORAL | 3 refills | Status: DC

## 2017-12-13 MED ORDER — ATORVASTATIN CALCIUM 20 MG PO TABS: 20.0000 mg | ORAL_TABLET | Freq: Every day | ORAL | 3 refills | Status: DC

## 2017-12-13 MED ORDER — PROPRANOLOL HCL 20 MG PO TABS: 20.0000 mg | ORAL_TABLET | Freq: Two times a day (BID) | ORAL | 3 refills | Status: DC

## 2017-12-13 MED ORDER — SERTRALINE HCL 25 MG PO TABS: 25.0000 mg | ORAL_TABLET | Freq: Every day | ORAL | 3 refills | Status: DC

## 2017-12-13 NOTE — Progress Notes (Signed)
Patient: Erin Wagner Female    DOB: 08-Aug-1940   77 y.o.   MRN: [id number] Visit Date: 12/13/2017  Today's Provider: Lavon Paganini, MD   I, Martha Clan, CMA, am acting as scribe for Lavon Paganini, MD.  Chief Complaint  Patient presents with  . Diabetes  . Hypertension   Subjective:    HPI      Diabetes Mellitus Type II, Follow-up:   Lab Results  Component Value Date   HGBA1C 5.9 (H) 06/27/2017    Last seen for diabetes 6 months ago.  Management since then includes none. She reports excellent compliance with treatment. She is not having side effects. Current symptoms include none and have been stable. Home blood sugar records: unknown  Episodes of hypoglycemia? no   Current Insulin Regimen: no - on no medications Most Recent Eye Exam: 10/09/17 Weight trend: stable Prior visit with dietician: no Current diet: in general, a "healthy" diet   Current exercise: no regular exercise  Pertinent Labs:    Component Value Date/Time   CHOL 133 06/27/2017 1859   TRIG 158 (H) 06/27/2017 1859   HDL 45 06/27/2017 1859   LDLCALC 56 06/27/2017 1859   CREATININE 0.79 06/27/2017 1859    Wt Readings from Last 3 Encounters:  09/24/17 105 lb 14.4 oz (48 kg)  09/09/17 104 lb 12.8 oz (47.5 kg)  09/03/17 106 lb 4.8 oz (48.2 kg)    ------------------------------------------------------------------------  Hypertension, follow-up:  BP Readings from Last 3 Encounters:  09/24/17 122/67  09/09/17 130/60  09/03/17 126/62    She was last seen for hypertension 3 months ago.  BP at that visit was 130/60. Management since that visit includes continuing medications. She reports excellent compliance with treatment. She is not having side effects. She is adherent to low salt diet.   Outside blood pressures are unknwon. She is experiencing none.  Patient denies chest pain, chest pressure/discomfort, claudication, dyspnea, exertional chest pressure/discomfort,  fatigue, irregular heart beat, lower extremity edema, near-syncope, orthopnea, palpitations, paroxysmal nocturnal dyspnea, syncope and tachypnea.   Cardiovascular risk factors include advanced age (older than 47 for men, 33 for women), diabetes mellitus, dyslipidemia and hypertension.  Use of agents associated with hypertension: thyroid hormones.    ------------------------------------------------------------------------ Patient also shows me swelling of L arm at end of visit.  She is s/p bilateral mastectomies.  She states that this arm has been swelling since that time.  We did get an upper extremity ultrasound of this area at her last visit about 3 months ago.  It has not changed since that time.  She has never used a compression sleeve.  No Known Allergies   Current Outpatient Medications:  .  amLODipine (NORVASC) 5 MG tablet, Take 1 tablet (5 mg total) by mouth daily., Disp: 90 tablet, Rfl: 3 .  atorvastatin (LIPITOR) 20 MG tablet, Take 1 tablet (20 mg total) by mouth daily., Disp: 90 tablet, Rfl: 3 .  cetirizine (ZYRTEC) 10 MG tablet, Take 1 tablet (10 mg total) by mouth daily., Disp: 90 tablet, Rfl: 3 .  levothyroxine (SYNTHROID, LEVOTHROID) 25 MCG tablet, Take 1/2 tablet daily, Disp: 45 tablet, Rfl: 3 .  metFORMIN (GLUCOPHAGE) 1000 MG tablet, Take 0.5 tablets (500 mg total) by mouth 2 (two) times daily with a meal., Disp: 90 tablet, Rfl: 3 .  propranolol (INDERAL) 20 MG tablet, Take 1 tablet (20 mg total) by mouth 2 (two) times daily., Disp: 180 tablet, Rfl: 3 .  ranitidine (ZANTAC) 150 MG tablet, Take  1 tablet (150 mg total) by mouth at bedtime., Disp: 90 tablet, Rfl: 3 .  sertraline (ZOLOFT) 25 MG tablet, Take 1 tablet (25 mg total) by mouth daily., Disp: 90 tablet, Rfl: 3 .  tamoxifen (NOLVADEX) 20 MG tablet, Take 20 mg by mouth daily., Disp: , Rfl:   Review of Systems  Constitutional: Negative.   HENT: Negative.   Eyes: Negative.   Respiratory: Negative.   Cardiovascular:  Negative.   Gastrointestinal: Negative.   Genitourinary: Negative.   Musculoskeletal: Negative.   Neurological: Negative.   Psychiatric/Behavioral: Negative.     Social History   Tobacco Use  . Smoking status: Never Smoker  . Smokeless tobacco: Never Used  Substance Use Topics  . Alcohol use: No    Frequency: Never   Objective:   There were no vitals taken for this visit. There were no vitals filed for this visit.   Physical Exam  Constitutional: She is oriented to person, place, and time. She appears well-developed and well-nourished. No distress.  HENT:  Head: Normocephalic and atraumatic.  Eyes: Conjunctivae are normal. Right eye exhibits no discharge. Left eye exhibits no discharge. No scleral icterus.  Neck: Neck supple. No thyromegaly present.  Cardiovascular: Normal rate, regular rhythm, normal heart sounds and intact distal pulses.  No murmur heard. Pulmonary/Chest: Effort normal and breath sounds normal. No respiratory distress. She has no wheezes. She has no rales.  Musculoskeletal:  Edema of L UE  Lymphadenopathy:    She has no cervical adenopathy.  Neurological: She is alert and oriented to person, place, and time.  Skin: Skin is warm and dry. Capillary refill takes less than 2 seconds. No rash noted.  Psychiatric: She has a normal mood and affect. Her behavior is normal.  Vitals reviewed.   Diabetic Foot Exam - Simple   Simple Foot Form Diabetic Foot exam was performed with the following findings:  Yes 12/13/2017  4:40 PM  Visual Inspection No deformities, no ulcerations, no other skin breakdown bilaterally:  Yes Sensation Testing Intact to touch and monofilament testing bilaterally:  Yes Pulse Check Posterior Tibialis and Dorsalis pulse intact bilaterally:  Yes Comments         Results for orders placed or performed in visit on 12/13/17  POCT UA - Microalbumin  Result Value Ref Range   Microalbumin Ur, POC 0 mg/L  POCT glycosylated hemoglobin (Hb  A1C)  Result Value Ref Range   Hemoglobin A1C 5.8 (A) 4.0 - 5.6 %   Est. average glucose Bld gHb Est-mCnc 120     Assessment & Plan:     Problem List Items Addressed This Visit      Cardiovascular and Mediastinum   Hypertension    Well-controlled Continue current medications Recent CMP reviewed Patient is expected to get lab work for oncology next month and suspect they will do a CMP, so I will review that at that time Follow-up in 6 months      Relevant Medications   propranolol (INDERAL) 20 MG tablet   atorvastatin (LIPITOR) 20 MG tablet   amLODipine (NORVASC) 5 MG tablet     Endocrine   Diabetes mellitus without complication (HCC) - Primary    Well-controlled A1c improved from 5.9-5.8 Continue metformin at current dose Foot exam completed today Urine microalbumin negative-repeat in 1 year Up-to-date on eye exam Patient declines vaccinations Follow-up in 6 months      Relevant Medications   metFORMIN (GLUCOPHAGE) 1000 MG tablet   atorvastatin (LIPITOR) 20 MG tablet  Other Relevant Orders   POCT UA - Microalbumin (Completed)   POCT glycosylated hemoglobin (Hb A1C) (Completed)     Other   Lymphedema    Suspect that patient's left upper extremity swelling is lymphedema related to previous mastectomies Given prescription for upper extremity compression sleeve that she can take to medical supply store to be fitted for Previous upper extremity ultrasound without significant findings Return precautions discussed          Return in about 6 months (around 06/14/2018) for chronic disease f/u.   The entirety of the information documented in the History of Present Illness, Review of Systems and Physical Exam were personally obtained by me. Portions of this information were initially documented by Raquel Sarna Ratchford, CMA and reviewed by me for thoroughness and accuracy.    Virginia Crews, MD, MPH [facility] 12/13/2017 4:43 PM

## 2017-12-13 NOTE — Assessment & Plan Note (Signed)
Suspect that patient's left upper extremity swelling is lymphedema related to previous mastectomies Given prescription for upper extremity compression sleeve that she can take to medical supply store to be fitted for Previous upper extremity ultrasound without significant findings Return precautions discussed

## 2017-12-13 NOTE — Assessment & Plan Note (Signed)
Well-controlled A1c improved from 5.9-5.8 Continue metformin at current dose Foot exam completed today Urine microalbumin negative-repeat in 1 year Up-to-date on eye exam Patient declines vaccinations Follow-up in 6 months

## 2017-12-13 NOTE — Assessment & Plan Note (Signed)
Well-controlled Continue current medications Recent CMP reviewed Patient is expected to get lab work for oncology next month and suspect they will do a CMP, so I will review that at that time Follow-up in 6 months

## 2017-12-13 NOTE — Patient Instructions (Signed)
You can get the compression sleeve at [facility] in [place]  Lymphedema Lymphedema is swelling that is caused by the abnormal collection of lymph under the skin. Lymph is fluid from the tissues in your body that travels in the lymphatic system. This system is part of the immune system and includes lymph nodes and lymph vessels. The lymph vessels collect and carry the excess fluid, fats, proteins, and wastes from the tissues of the body to the bloodstream. This system also works to clean and remove bacteria and waste products from the body. Lymphedema occurs when the lymphatic system is blocked. When the lymph vessels or lymph nodes are blocked or damaged, lymph does not drain properly, causing an abnormal buildup of lymph. This leads to swelling in the arms or legs. Lymphedema cannot be cured by medicines, but various methods can be used to help reduce the swelling. What are the causes? There are two types of lymphedema. Primary lymphedema is caused by the absence or abnormality of the lymph vessel at birth. Secondary lymphedema is more common. It occurs when the lymph vessel is damaged or blocked. Common causes of lymph vessel blockage include:  Skin infection, such as cellulitis.  Infection by parasites (filariasis).  Injury.  Cancer.  Radiation therapy.  Formation of scar tissue.  Surgery.  What are the signs or symptoms? Symptoms of this condition include:  Swelling of the arm or leg.  A heavy or tight feeling in the arm or leg.  Swelling of the feet, toes, or fingers. Shoes or rings may fit more tightly than before.  Redness of the skin over the affected area.  Limited movement of the affected limb.  Sensitivity to touch or discomfort in the affected limb.  How is this diagnosed? This condition may be diagnosed with:  A physical exam.  Medical history.  Bioimpedance spectroscopy. In this test, painless electrical currents are used to measure fluid  levels in your body.  Imaging tests, such as: ? Lymphoscintigraphy. In this test, a low dose of a radioactive substance is injected to trace the flow of lymph through the lymph vessels. ? MRI. ? CT scan. ? Duplex ultrasound. This test uses sound waves to produce images of the vessels and the blood flow on a screen. ? Lymphangiography. In this test, a contrast dye is injected into the lymph vessel to help show blockages.  How is this treated? Treatment for this condition may depend on the cause. Treatment may include:  Exercise. Certain exercises can help fluid move out of the affected limb.  Massage. Gentle massage of the affected limb can help move the fluid out of the area.  Compression. Various methods may be used to apply pressure to the affected limb in order to reduce the swelling. ? Wearing compression stockings or sleeves on the affected limb. ? Bandaging the affected limb. ? Using an external pump that is attached to a sleeve that alternates between applying pressure and releasing pressure.  Surgery. This is usually only done for severe cases. For example, surgery may be done if you have trouble moving the limb or if the swelling does not get better with other treatments.  If an underlying condition is causing the lymphedema, treatment for that condition is needed. For example, antibiotic medicines may be used to treat an infection. Follow these instructions at home: Activities  Exercise regularly as directed by your health care provider.  Do not sit with your legs crossed.  When possible, keep the affected limb raised (  elevated) above the level of your heart.  Avoid carrying things with an arm that is affected by lymphedema.  Remember that the affected area is more likely to become injured or infected.  Take these steps to help prevent infection: ? Keep the affected area clean and dry. ? Protect your skin from cuts. For example, you should use gloves while cooking or  gardening. Do not walk barefoot. If you shave the affected area, use an Copy. General instructions  Take medicines only as directed by your health care provider.  Eat a healthy diet that includes a lot of fruits and vegetables.  Do not wear tight clothes, shoes, or jewelry.  Do not use heating pads over the affected area.  Avoid having blood pressure checked on the affected limb.  Keep all follow-up visits as directed by your health care provider. This is important. Contact a health care provider if:  You continue to have swelling in your limb.  You have a fever.  You have a cut that does not heal.  You have redness or pain in the affected area.  You have new swelling in your limb that comes on suddenly.  You develop purplish spots or sores (lesions) on your limb. Get help right away if:  You have a skin rash.  You have chills or sweats.  You have shortness of breath. This information is not intended to replace advice given to you by your health care provider. Make sure you discuss any questions you have with your health care provider. Document Released: 04/22/2007 Document Revised: 03/01/2016 Document Reviewed: 06/02/2014 Elsevier Interactive Patient Education  [employer].

## 2018-01-23 ENCOUNTER — Other Ambulatory Visit: Payer: Self-pay

## 2018-01-23 DIAGNOSIS — C50911 Malignant neoplasm of unspecified site of right female breast: Secondary | ICD-10-CM

## 2018-01-24 ENCOUNTER — Encounter: Payer: Self-pay | Admitting: Oncology

## 2018-01-24 ENCOUNTER — Inpatient Hospital Stay: Payer: Medicaid Other | Attending: Oncology | Admitting: Oncology

## 2018-01-24 ENCOUNTER — Inpatient Hospital Stay: Payer: Medicaid Other

## 2018-01-24 VITALS — BP 106/69 | HR 80 | Temp 97.1°F | Resp 16 | Ht <= 58 in | Wt 106.1 lb

## 2018-01-24 DIAGNOSIS — Z79810 Long term (current) use of selective estrogen receptor modulators (SERMs): Secondary | ICD-10-CM | POA: Insufficient documentation

## 2018-01-24 DIAGNOSIS — E079 Disorder of thyroid, unspecified: Secondary | ICD-10-CM | POA: Insufficient documentation

## 2018-01-24 DIAGNOSIS — E119 Type 2 diabetes mellitus without complications: Secondary | ICD-10-CM | POA: Diagnosis not present

## 2018-01-24 DIAGNOSIS — C50912 Malignant neoplasm of unspecified site of left female breast: Secondary | ICD-10-CM | POA: Insufficient documentation

## 2018-01-24 DIAGNOSIS — Z9221 Personal history of antineoplastic chemotherapy: Secondary | ICD-10-CM | POA: Insufficient documentation

## 2018-01-24 DIAGNOSIS — I10 Essential (primary) hypertension: Secondary | ICD-10-CM | POA: Diagnosis not present

## 2018-01-24 DIAGNOSIS — Z79899 Other long term (current) drug therapy: Secondary | ICD-10-CM

## 2018-01-24 DIAGNOSIS — E785 Hyperlipidemia, unspecified: Secondary | ICD-10-CM | POA: Diagnosis not present

## 2018-01-24 DIAGNOSIS — M81 Age-related 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 fracture: Secondary | ICD-10-CM | POA: Diagnosis not present

## 2018-01-24 DIAGNOSIS — Z170 Estrogen receptor positive status [ER+]: Secondary | ICD-10-CM | POA: Diagnosis not present

## 2018-01-24 DIAGNOSIS — Z9013 Acquired absence of bilateral breasts and nipples: Secondary | ICD-10-CM | POA: Insufficient documentation

## 2018-01-24 DIAGNOSIS — C50911 Malignant neoplasm of unspecified site of right female breast: Secondary | ICD-10-CM

## 2018-01-24 LAB — CBC WITH DIFFERENTIAL/PLATELET
Basophils Absolute: 0.1 10*3/uL (ref 0–0.1)
Basophils Relative: 1 %
EOS ABS: 0.6 10*3/uL (ref 0–0.7)
EOS PCT: 6 %
HCT: 34.5 % — ABNORMAL LOW (ref 35.0–47.0)
HEMOGLOBIN: 11.8 g/dL — AB (ref 12.0–16.0)
LYMPHS ABS: 2 10*3/uL (ref 1.0–3.6)
Lymphocytes Relative: 20 %
MCH: 29.5 pg (ref 26.0–34.0)
MCHC: 34.3 g/dL (ref 32.0–36.0)
MCV: 85.9 fL (ref 80.0–100.0)
MONO ABS: 1 10*3/uL — AB (ref 0.2–0.9)
MONOS PCT: 10 %
Neutro Abs: 6.3 10*3/uL (ref 1.4–6.5)
Neutrophils Relative %: 63 %
PLATELETS: 296 10*3/uL (ref 150–440)
RBC: 4.02 MIL/uL (ref 3.80–5.20)
RDW: 13.8 % (ref 11.5–14.5)
WBC: 9.9 10*3/uL (ref 3.6–11.0)

## 2018-01-24 NOTE — Progress Notes (Signed)
Left arm swollen and family says this is not new.

## 2018-01-27 ENCOUNTER — Encounter: Payer: Self-pay | Admitting: Oncology

## 2018-01-27 NOTE — Progress Notes (Signed)
Hematology/Oncology Consult note [facility]  Telephone:([phone] [phone]  Patient Care Team: Virginia Crews, MD as PCP - General (Family Medicine)   Name of the patient: Erin Wagner  [id number]  [birth year]   Date of visit: 01/27/18  Diagnosis- h/o b/l breast cancer treated in [place]  Chief complaint/ Reason for visit- routine f/u of breast cancer  Heme/Onc history: Patient is a 77 year old [place] speaking female from [place] with a past medical history significant for hypertension hyperlipidemia, diabetes and bilateral breast cancer.  She is here with son and daughter in law. She has refused formal language interpretor and would like her family to interpret for her. Her family is comfortable speaking in Hindi with me and translating for her.  Her oncology history is as follows:  1.  She was diagnosed with bilateral breast cancer in July 2017 in [place].  She initially received neoadjuvant chemotherapy with 3 cycles of epirubicin and cyclophosphamide.  This was followed by bilateral modified radical mastectomy.  I do not have the actual pathology report from [place].  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  I am unable to decipher the written handwriting well.  Appears to have 1 out of 13 lymph nodes positive with PNE positive, no LV I or perineural invasion.  Left breast mass showed 6 cm mixed infiltrating ductal and lobular histology.  Again handwritten notes mention 3 out of 16 lymph nodes positive.  In some other page on the outside report, the right breast cancer has been mentioned as T2 N0 in the left breast cancer has been mentioned as T4N0.  2.  Following surgery she had 1 cycle of epirubicin and cyclophosphamide and 2 cycles of doxorubicin.  She then had IMRT to her bilateral breasts over 5 weeks.  She was then asked to  take tamoxifen for 6 months only.  There is no mention about ER PR and HER-2 status anywhere in the outside reports. Patient was asked to take tamoxifen for 6 months following radiation and then stop it. She is currently not taking any hormonal therapy  3. 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 Currently patients has relocated to [place] and plans to stay in [place]. She reports doing well. She has occasional pain in her left axilla. She denies other complaints. Her appetite good. No unintentional weight loss. She is independent of her ADL's.   Interval history- Patient is here with her family. She speaks limited hindi which I can converse as well. Overall she is doing well. She is independent of her ADL's and iADL's. She does not report any pain or swelling in her extremities. Appetite is good. She denies any unintentional weight loss  ECOG PS- 1 Pain scale- 0 Opioid associated constipation- no  Review of systems- Review of Systems  Constitutional: Negative for chills, fever, malaise/fatigue and weight loss.  HENT: Negative for congestion, ear discharge and nosebleeds.   Eyes: Negative for blurred vision.  Respiratory: Negative for cough, hemoptysis, sputum production, shortness of breath and wheezing.   Cardiovascular: Negative for chest pain, palpitations, orthopnea and claudication.  Gastrointestinal: Negative for abdominal pain, blood in stool, constipation, diarrhea, heartburn, melena, nausea and vomiting.  Genitourinary: Negative for dysuria, flank pain, frequency, hematuria and urgency.  Musculoskeletal: Negative for back pain, joint pain and myalgias.  Skin: Negative for rash.  Neurological: Negative for dizziness, tingling, focal weakness, seizures, weakness and headaches.  Endo/Heme/Allergies: Does not bruise/bleed easily.    Psychiatric/Behavioral: Negative for depression and suicidal ideas. The patient does not have insomnia.       No Known Allergies   Past Medical History:  Diagnosis Date  . Cancer ([place])    breast  . Cataract   . Diabetes mellitus without complication ([place])   . Hyperlipidemia   . Hypertension   . Thyroid disease      Past Surgical History:  Procedure Laterality Date  . BREAST SURGERY    . EYE SURGERY      Social History   Socioeconomic History  . Marital status: Widowed    Spouse name: Not on file  . Number of children: 4  . Years of education: Not on file  . Highest education level: Not on file  Occupational History    Employer: retired    Comment: homemaker  Social Needs  . Financial resource strain: Not on file  . Food insecurity:    Worry: Not on file    Inability: Not on file  . Transportation needs:    Medical: Not on file    Non-medical: Not on file  Tobacco Use  . Smoking status: Never Smoker  . Smokeless tobacco: Never Used  Substance and Sexual Activity  . Alcohol use: No    Frequency: Never  . Drug use: No  . Sexual activity: Not on file  Lifestyle  . Physical activity:    Days per week: Not on file    Minutes per session: Not on file  . Stress: Not on file  Relationships  . Social connections:    Talks on phone: Not on file    Gets together: Not on file    Attends religious service: Not on file    Active member of club or organization: Not on file    Attends meetings of clubs or organizations: Not on file    Relationship status: Not on file  . Intimate partner violence:    Fear of current or ex partner: Not on file    Emotionally abused: Not on file    Physically abused: Not on file    Forced sexual activity: Not on file  Other Topics Concern  . Not on file  Social History Narrative  . Not on file    Family History  Problem Relation Age of Onset  . Healthy Sister   . Healthy Brother   . Healthy Daughter   . Healthy Sister    . Healthy Daughter   . Healthy Daughter      Current Outpatient Medications:  .  amLODipine (NORVASC) 5 MG tablet, Take 1 tablet (5 mg total) by mouth daily., Disp: 90 tablet, Rfl: 3 .  atorvastatin (LIPITOR) 20 MG tablet, Take 1 tablet (20 mg total) by mouth daily., Disp: 90 tablet, Rfl: 3 .  Calcium Carbonate-Vitamin D (CALCIUM 600+D) 600-400 MG-UNIT tablet, Take 1 tablet by mouth 2 (two) times daily., Disp: , Rfl:  .  levothyroxine (SYNTHROID, LEVOTHROID) 25 MCG tablet, Take 1/2 tablet daily, Disp: 45 tablet, Rfl: 3 .  metFORMIN (GLUCOPHAGE) 1000 MG tablet, Take 0.5 tablets (500 mg total) by mouth 2 (two) times daily with a meal., Disp: 90 tablet, Rfl: 3 .  propranolol (INDERAL) 20 MG tablet, Take 1 tablet (20 mg total) by mouth 2 (two) times daily., Disp:  180 tablet, Rfl: 3 .  ranitidine (ZANTAC) 150 MG tablet, Take 1 tablet (150 mg total) by mouth at bedtime., Disp: 90 tablet, Rfl: 3 .  sertraline (ZOLOFT) 25 MG tablet, Take 1 tablet (25 mg total) by mouth daily., Disp: 90 tablet, Rfl: 3  Physical exam:  Vitals:   01/24/18 1455  BP: 106/69  Pulse: 80  Resp: 16  Temp: (!) 97.1 F (36.2 C)  TempSrc: Oral  Weight: 106 lb 1.6 oz (48.1 kg)  Height: '4\' 6"'$  (1.372 m)   Physical Exam  Constitutional: She is oriented to person, place, and time.  Thin elderly female in no acute distress  HENT:  Head: Normocephalic and atraumatic.  Eyes: Pupils are equal, round, and reactive to light. EOM are normal.  Neck: Normal range of motion.  Cardiovascular: Normal rate, regular rhythm and normal heart sounds.  Pulmonary/Chest: Effort normal and breath sounds normal.  Abdominal: Soft. Bowel sounds are normal.  Neurological: She is alert and oriented to person, place, and time.  Skin: Skin is warm and dry.  She is status post bilateral mastectomy without reconstruction.  No evidence of chest wall recurrence.  No palpable bilateral axillary adenopathy  CMP Latest Ref Rng & Units 06/27/2017   Glucose 65 - 99 mg/dL 112(H)  BUN 8 - 27 mg/dL 12  Creatinine 0.57 - 1.00 mg/dL 0.79  Sodium 134 - 144 mmol/L 135  Potassium 3.5 - 5.2 mmol/L 5.5(H)  Chloride 96 - 106 mmol/L 99  CO2 20 - 29 mmol/L 25  Calcium 8.7 - 10.3 mg/dL 9.8  Total Protein 6.0 - 8.5 g/dL 7.1  Total Bilirubin 0.0 - 1.2 mg/dL <0.2  Alkaline Phos 39 - 117 IU/L 45  AST 0 - 40 IU/L 23  ALT 0 - 32 IU/L 21   CBC Latest Ref Rng & Units 01/24/2018  WBC 3.6 - 11.0 K/uL 9.9  Hemoglobin 12.0 - 16.0 g/dL 11.8(L)  Hematocrit 35.0 - 47.0 % 34.5(L)  Platelets 150 - 440 K/uL 296     Assessment and plan- Patient is a 77 y.o. female with bilateral breast cancer (stage is unclear and ER PR and her 2 neu status is also unclear) s/p neoadjuvant chemotherapy followed bilateral mastectomy without reconstruction, adjuvant chemotherapy, RT and 6 months of tamoxifen  At this time patient's family is unable to obtain ER PR and HER-2 status of her pathology report from [place].  Given that she got tamoxifen for 6 months, I am presuming that this was ER PR positive.  Clinically she is doing well and there is no evidence of recurrence on today's exam.  At this time I would give her the benefit of doubt and start her on tamoxifen 20 mg daily which she will continue to take for 5 to 10 years.  Discussed risks and benefits of tamoxifen with patient and her family including all but not limited to hot flashes, mood swings, risk of DVT and cataracts.  They understand and are willing to start the patient on tamoxifen.  Also discussed the results of bone density scan which revealed significant osteoporosis.  I am therefore in favor of using tamoxifen over AI.  I would recommend Fosamax 70 mg weekly for her osteoporosis at this time.  She will also take calcium 1200 mg along with vitamin D 800 international units daily   Typically staging surveillance scans are not recommended in breast cancer.  However, I would like to get some baseline scans at this time  since all her work-up has  been done in [place].  I will see her back in 6 weeks time to see how she is tolerating her tamoxifen as well as to discuss the results of her CT chest abdomen and pelvis and bone scan    Visit Diagnosis 1. Malignant neoplasm of right female breast, unspecified estrogen receptor status, unspecified site of breast ([place])   2. Malignant neoplasm of left female breast, unspecified estrogen receptor status, unspecified site of breast ([place])      Dr. Randa Evens, MD, MPH [facility] [phone] 01/27/2018 9:49 AM

## 2018-01-28 ENCOUNTER — Other Ambulatory Visit: Payer: Self-pay | Admitting: *Deleted

## 2018-01-28 MED ORDER — TAMOXIFEN CITRATE 20 MG PO TABS: 20.0000 mg | ORAL_TABLET | Freq: Every day | ORAL | 3 refills | Status: DC

## 2018-02-05 ENCOUNTER — Ambulatory Visit: Payer: Medicaid Other

## 2018-02-06 ENCOUNTER — Ambulatory Visit
Admission: RE | Admit: 2018-02-06 | Discharge: 2018-02-06 | Disposition: A | Discharge status: Home / Self Care | Payer: Medicaid Other | Source: Ambulatory Visit | Attending: Oncology | Admitting: Oncology

## 2018-02-06 DIAGNOSIS — Y842 Radiological procedure and radiotherapy as the cause of abnormal reaction of the patient, or of later complication, without mention of misadventure at the time of the procedure: Secondary | ICD-10-CM | POA: Insufficient documentation

## 2018-02-06 DIAGNOSIS — Z9013 Acquired absence of bilateral breasts and nipples: Secondary | ICD-10-CM | POA: Diagnosis not present

## 2018-02-06 DIAGNOSIS — C50912 Malignant neoplasm of unspecified site of left female breast: Secondary | ICD-10-CM | POA: Insufficient documentation

## 2018-02-06 DIAGNOSIS — C50911 Malignant neoplasm of unspecified site of right female breast: Secondary | ICD-10-CM | POA: Diagnosis present

## 2018-02-06 LAB — POCT I-STAT CREATININE: CREATININE: 0.8 mg/dL (ref 0.44–1.00)

## 2018-02-06 MED ORDER — IOPAMIDOL (ISOVUE-300) INJECTION 61%: 75.0000 mL | Freq: Once | INTRAVENOUS | Status: DC | PRN

## 2018-02-06 MED ORDER — IOHEXOL 300 MG/ML  SOLN
75.0000 mL | Freq: Once | INTRAMUSCULAR | Status: AC | PRN
  Administered 2018-02-06: 75 mL via INTRAVENOUS

## 2018-02-07 ENCOUNTER — Other Ambulatory Visit: Payer: Medicaid Other

## 2018-02-13 ENCOUNTER — Encounter: Payer: Self-pay | Admitting: Family Medicine

## 2018-02-14 MED ORDER — BENZONATATE 100 MG PO CAPS: 100.0000 mg | ORAL_CAPSULE | Freq: Two times a day (BID) | ORAL | 0 refills | Status: DC | PRN

## 2018-02-17 ENCOUNTER — Encounter
Admission: RE | Admit: 2018-02-17 | Discharge: 2018-02-17 | Disposition: A | Discharge status: Home / Self Care | Payer: Medicaid Other | Source: Ambulatory Visit | Attending: Oncology | Admitting: Oncology

## 2018-02-17 DIAGNOSIS — C50911 Malignant neoplasm of unspecified site of right female breast: Secondary | ICD-10-CM | POA: Diagnosis not present

## 2018-02-17 DIAGNOSIS — C50912 Malignant neoplasm of unspecified site of left female breast: Secondary | ICD-10-CM | POA: Insufficient documentation

## 2018-02-17 MED ORDER — TECHNETIUM TC 99M MEDRONATE IV KIT
21.0000 | PACK | Freq: Once | INTRAVENOUS | Status: AC | PRN
  Administered 2018-02-17: 21 via INTRAVENOUS

## 2018-03-07 ENCOUNTER — Inpatient Hospital Stay: Payer: Medicaid Other | Attending: Oncology | Admitting: Oncology

## 2018-03-07 ENCOUNTER — Inpatient Hospital Stay: Payer: Medicaid Other

## 2018-03-07 ENCOUNTER — Other Ambulatory Visit: Payer: Self-pay

## 2018-03-07 ENCOUNTER — Encounter: Payer: Self-pay | Admitting: Oncology

## 2018-03-07 VITALS — BP 126/71 | HR 71 | Temp 97.7°F | Resp 18 | Ht <= 58 in | Wt 106.8 lb

## 2018-03-07 DIAGNOSIS — Z9013 Acquired absence of bilateral breasts and nipples: Secondary | ICD-10-CM | POA: Insufficient documentation

## 2018-03-07 DIAGNOSIS — Z170 Estrogen receptor positive status [ER+]: Secondary | ICD-10-CM

## 2018-03-07 DIAGNOSIS — C50911 Malignant neoplasm of unspecified site of right female breast: Secondary | ICD-10-CM

## 2018-03-07 DIAGNOSIS — C50912 Malignant neoplasm of unspecified site of left female breast: Secondary | ICD-10-CM

## 2018-03-07 DIAGNOSIS — N281 Cyst of kidney, acquired: Secondary | ICD-10-CM

## 2018-03-07 DIAGNOSIS — Z9221 Personal history of antineoplastic chemotherapy: Secondary | ICD-10-CM | POA: Diagnosis not present

## 2018-03-07 DIAGNOSIS — Z79899 Other long term (current) drug therapy: Secondary | ICD-10-CM | POA: Diagnosis not present

## 2018-03-07 DIAGNOSIS — M4317 Spondylolisthesis, lumbosacral region: Secondary | ICD-10-CM | POA: Insufficient documentation

## 2018-03-07 DIAGNOSIS — I10 Essential (primary) hypertension: Secondary | ICD-10-CM | POA: Diagnosis not present

## 2018-03-07 DIAGNOSIS — M81 Age-related 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 fracture: Secondary | ICD-10-CM | POA: Diagnosis not present

## 2018-03-07 DIAGNOSIS — C060 Malignant neoplasm of cheek mucosa: Secondary | ICD-10-CM | POA: Insufficient documentation

## 2018-03-07 DIAGNOSIS — Z7984 Long term (current) use of oral hypoglycemic drugs: Secondary | ICD-10-CM | POA: Insufficient documentation

## 2018-03-07 DIAGNOSIS — E785 Hyperlipidemia, unspecified: Secondary | ICD-10-CM | POA: Insufficient documentation

## 2018-03-07 DIAGNOSIS — Z08 Encounter for follow-up examination after completed treatment for malignant neoplasm: Secondary | ICD-10-CM

## 2018-03-07 DIAGNOSIS — E119 Type 2 diabetes mellitus without complications: Secondary | ICD-10-CM | POA: Diagnosis not present

## 2018-03-07 DIAGNOSIS — Z853 Personal history of malignant neoplasm of breast: Secondary | ICD-10-CM

## 2018-03-07 DIAGNOSIS — E079 Disorder of thyroid, unspecified: Secondary | ICD-10-CM | POA: Diagnosis not present

## 2018-03-07 DIAGNOSIS — I890 Lymphedema, not elsewhere classified: Secondary | ICD-10-CM | POA: Diagnosis not present

## 2018-03-07 DIAGNOSIS — Z79810 Long term (current) use of selective estrogen receptor modulators (SERMs): Secondary | ICD-10-CM | POA: Insufficient documentation

## 2018-03-07 LAB — COMPREHENSIVE METABOLIC PANEL
ALT: 17 U/L (ref 0–44)
ANION GAP: 9 (ref 5–15)
AST: 24 U/L (ref 15–41)
Albumin: 4.1 g/dL (ref 3.5–5.0)
Alkaline Phosphatase: 42 U/L (ref 38–126)
BILIRUBIN TOTAL: 0.4 mg/dL (ref 0.3–1.2)
BUN: 15 mg/dL (ref 8–23)
CALCIUM: 9.6 mg/dL (ref 8.9–10.3)
CO2: 24 mmol/L (ref 22–32)
Chloride: 98 mmol/L (ref 98–111)
Creatinine, Ser: 0.85 mg/dL (ref 0.44–1.00)
GLUCOSE: 116 mg/dL — AB (ref 70–99)
Potassium: 4.1 mmol/L (ref 3.5–5.1)
Sodium: 131 mmol/L — ABNORMAL LOW (ref 135–145)
TOTAL PROTEIN: 7.5 g/dL (ref 6.5–8.1)

## 2018-03-07 MED ORDER — TAMOXIFEN CITRATE 20 MG PO TABS: 20.0000 mg | ORAL_TABLET | Freq: Every day | ORAL | 3 refills | Status: DC

## 2018-03-07 NOTE — Progress Notes (Signed)
Hematology/Oncology Consult note [facility]  Telephone:([phone] [phone]  Patient Care Team: Virginia Crews, MD as PCP - General (Family Medicine)   Name of the patient: Erin Wagner  [id number]  [birth year]   Date of visit: 03/07/18  Diagnosis- h/o b/l breast cancer  treated in [place] currently on tamoxifen  Chief complaint/ Reason for visit- assess tolerance to tamoxifen  Heme/Onc history: Patient is a 77 year old [place] speaking female from [place] with a past medical history significant for hypertension hyperlipidemia, diabetes and bilateral breast cancer.She is here with son and daughter in law. She has refused formal language interpretor and would like her family to interpret for her. Her family is comfortable speaking in Hindi with me and translating for her.  Her oncology history is as follows:  1.She was diagnosed with bilateral breast cancer in July 2017 in [place]. She initially received neoadjuvant chemotherapy with 3 cycles of epirubicin and cyclophosphamide. This was followed by bilateral modified radical mastectomy. I do not have the actual pathology report from [place]. 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 I am unable to decipher the written handwriting well. Appears to have 1 out of 13 lymph nodes positive with PNE positive, no LV I or perineural invasion. Left breast mass showed 6 cm mixed infiltrating ductal and lobular histology. Again handwritten notes mention 3 out of 16 lymph nodes positive. In some other page on theoutside report, the right breast cancer has been mentioned as T2 N0 in the left breast cancer has been mentioned as T4N0.  2.Following surgery she had 1 cycle of epirubicin and cyclophosphamide and 2 cycles of doxorubicin. She then had IMRT to her bilateral breasts over 5 weeks.  She was then asked to take tamoxifen for 6 months only. There is no mention about ER PR and HER-2 status anywhere in the outside reports. Patient was asked to take tamoxifen for 6 months following radiation and then stop it.   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  Patient had a bone density scan in July 2019 which showed significant osteoporosis.  Since definitive evidence of ER status was not obtainable, given history of bilateral node positive breast cancer and was to proceed with tamoxifen assuming that it was ER PR positive.  Tamoxifen 20 mg started July 2019.  Fosamax weekly started for osteoporosis.  She also had CT chest abdomen and pelvis as well as bone scan which did not reveal any evidence of malignancy.  Focally increased activity in the left second rib to the presence of subacute fracture.  Interval history- she is tolerating tamoxifen well and reports no significant side effects. She has occasional pain her RUE from lymphedema.   ECOG PS- 1 Pain scale- 0 Opioid associated constipation- no  Review of systems- Review of Systems  Constitutional: Negative for chills, fever, malaise/fatigue and weight loss.  HENT: Negative for congestion, ear discharge and nosebleeds.   Eyes: Negative for blurred vision.  Respiratory: Negative for cough, hemoptysis, sputum production, shortness of breath and wheezing.   Cardiovascular: Negative for chest pain, palpitations, orthopnea and claudication.  Gastrointestinal: Negative for abdominal pain, blood in stool, constipation, diarrhea, heartburn, melena, nausea and vomiting.  Genitourinary: Negative for dysuria, flank pain, frequency, hematuria and urgency.  Musculoskeletal: Negative for back pain, joint pain and myalgias.  Skin: Negative for rash.  Neurological: Negative for dizziness, tingling,  focal weakness, seizures, weakness and headaches.  Endo/Heme/Allergies: Does not bruise/bleed easily.  Psychiatric/Behavioral: Negative for depression and suicidal ideas. The patient does not have insomnia.        No Known Allergies   Past Medical History:  Diagnosis Date  . Cancer ([place])    breast  . Cataract   . Diabetes mellitus without complication ([place])   . Hyperlipidemia   . Hypertension   . Thyroid disease      Past Surgical History:  Procedure Laterality Date  . BREAST SURGERY    . EYE SURGERY      Social History   Socioeconomic History  . Marital status: Widowed    Spouse name: Not on file  . Number of children: 4  . Years of education: Not on file  . Highest education level: Not on file  Occupational History    Employer: retired    Comment: homemaker  Social Needs  . Financial resource strain: Not on file  . Food insecurity:    Worry: Not on file    Inability: Not on file  . Transportation needs:    Medical: Not on file    Non-medical: Not on file  Tobacco Use  . Smoking status: Never Smoker  . Smokeless tobacco: Never Used  Substance and Sexual Activity  . Alcohol use: No    Frequency: Never  . Drug use: No  . Sexual activity: Not on file  Lifestyle  . Physical activity:    Days per week: Not on file    Minutes per session: Not on file  . Stress: Not on file  Relationships  . Social connections:    Talks on phone: Not on file    Gets together: Not on file    Attends religious service: Not on file    Active member of club or organization: Not on file    Attends meetings of clubs or organizations: Not on file    Relationship status: Not on file  . Intimate partner violence:    Fear of current or ex partner: Not on file    Emotionally abused: Not on file    Physically abused: Not on file    Forced sexual activity: Not on file  Other Topics Concern  . Not on file  Social History Narrative  . Not on file    Family History  Problem  Relation Age of Onset  . Healthy Sister   . Healthy Brother   . Healthy Daughter   . Healthy Sister   . Healthy Daughter   . Healthy Daughter      Current Outpatient Medications:  .  amLODipine (NORVASC) 5 MG tablet, Take 1 tablet (5 mg total) by mouth daily., Disp: 90 tablet, Rfl: 3 .  atorvastatin (LIPITOR) 20 MG tablet, Take 1 tablet (20 mg total) by mouth daily., Disp: 90 tablet, Rfl: 3 .  benzonatate (TESSALON) 100 MG capsule, Take 1 capsule (100 mg total) by mouth 2 (two) times daily as needed for cough., Disp: 30 capsule, Rfl: 0 .  Calcium Carbonate-Vitamin D (CALCIUM 600+D) 600-400 MG-UNIT tablet, Take 1 tablet by mouth 2 (two) times daily., Disp: , Rfl:  .  levothyroxine (SYNTHROID, LEVOTHROID) 25 MCG tablet, Take 1/2 tablet daily, Disp: 45 tablet, Rfl: 3 .  metFORMIN (GLUCOPHAGE) 1000 MG tablet, Take 0.5 tablets (500 mg total) by mouth 2 (two) times daily  with a meal., Disp: 90 tablet, Rfl: 3 .  propranolol (INDERAL) 20 MG tablet, Take 1 tablet (20 mg total) by mouth 2 (two) times daily., Disp: 180 tablet, Rfl: 3 .  ranitidine (ZANTAC) 150 MG tablet, Take 1 tablet (150 mg total) by mouth at bedtime., Disp: 90 tablet, Rfl: 3 .  sertraline (ZOLOFT) 25 MG tablet, Take 1 tablet (25 mg total) by mouth daily., Disp: 90 tablet, Rfl: 3 .  tamoxifen (NOLVADEX) 20 MG tablet, Take 1 tablet (20 mg total) by mouth daily., Disp: 30 tablet, Rfl: 3  Physical exam:  Vitals:   03/07/18 1457  BP: 126/71  Pulse: 71  Resp: 18  Temp: 97.7 F (36.5 C)  TempSrc: Tympanic  SpO2: 98%  Weight: 106 lb 12.8 oz (48.4 kg)  Height: '4\' 6"'  (1.372 m)   Physical Exam  Constitutional: She is oriented to person, place, and time.  HENT:  Head: Normocephalic and atraumatic.  Eyes: Pupils are equal, round, and reactive to light. EOM are normal.  Neck: Normal range of motion.  Cardiovascular: Normal rate, regular rhythm and normal heart sounds.  Pulmonary/Chest: Effort normal and breath sounds normal.    Abdominal: Soft. Bowel sounds are normal.  Neurological: She is alert and oriented to person, place, and time.  Skin: Skin is warm and dry.     CMP Latest Ref Rng & Units 02/06/2018  Glucose 65 - 99 mg/dL -  BUN 8 - 27 mg/dL -  Creatinine 0.44 - 1.00 mg/dL 0.80  Sodium 134 - 144 mmol/L -  Potassium 3.5 - 5.2 mmol/L -  Chloride 96 - 106 mmol/L -  CO2 20 - 29 mmol/L -  Calcium 8.7 - 10.3 mg/dL -  Total Protein 6.0 - 8.5 g/dL -  Total Bilirubin 0.0 - 1.2 mg/dL -  Alkaline Phos 39 - 117 IU/L -  AST 0 - 40 IU/L -  ALT 0 - 32 IU/L -   CBC Latest Ref Rng & Units 01/24/2018  WBC 3.6 - 11.0 K/uL 9.9  Hemoglobin 12.0 - 16.0 g/dL 11.8(L)  Hematocrit 35.0 - 47.0 % 34.5(L)  Platelets 150 - 440 K/uL 296    No images are attached to the encounter.  Ct Chest W Contrast  Result Date: 02/06/2018 CLINICAL DATA:  Bilateral breast cancer, status post neoadjuvant chemotherapy and bilateral mastectomy EXAM: CT CHEST, ABDOMEN, AND PELVIS WITH CONTRAST TECHNIQUE: Multidetector CT imaging of the chest, abdomen and pelvis was performed following the standard protocol during bolus administration of intravenous contrast. CONTRAST:  11m OMNIPAQUE IOHEXOL 300 MG/ML  SOLN COMPARISON:  None. FINDINGS: CT CHEST FINDINGS Cardiovascular: Heart is normal in size.  No pericardial effusion. No evidence of thoracic aortic aneurysm. Atherosclerotic calcifications of the aortic arch. Mediastinum/Nodes: Small mediastinal lymph nodes, including a 7 mm short axis subcarinal node. No suspicious hilar or axillary lymphadenopathy. Visualized thyroid is unremarkable. Lungs/Pleura: Radiation changes in the anterior upper lobes and lateral right middle/lower lobe. Mild centrilobular and paraseptal emphysematous changes. No focal consolidation. No suspicious pulmonary nodules. No pleural effusion or pneumothorax. Musculoskeletal: Mild degenerative changes of the thoracic spine. Status post bilateral mastectomy. CT ABDOMEN PELVIS FINDINGS  Hepatobiliary: Liver is within normal limits. Gallbladder is unremarkable. No intrahepatic or extrahepatic ductal dilatation. Pancreas: Within normal limits. Spleen: Within normal limits. Adrenals/Urinary Tract: Adrenal glands are within normal limits. 2.4 cm right upper pole renal cyst (series 2/image 48). Left kidney is within normal limits. No hydronephrosis. Bladder is within normal limits. Stomach/Bowel: Stomach is within normal limits. No  evidence of bowel obstruction. Normal appendix (series 2/image 81). Vascular/Lymphatic: No evidence of abdominal aortic aneurysm. Atherosclerotic calcifications of the abdominal aorta and branch vessels. No suspicious abdominopelvic lymphadenopathy. Reproductive: Uterus is within normal limits. Bilateral ovaries are within normal limits. Other: No abdominopelvic ascites. Musculoskeletal: Grade 2 spondylolisthesis at L5-S1. Mild degenerative changes of the lumbar spine. IMPRESSION: Status post bilateral mastectomy. Radiation changes in the lungs bilaterally, upper lobe predominant. Small mediastinal lymph nodes, including a 7 mm short axis subcarinal node, within normal limits. No findings specific for recurrent or metastatic disease. Electronically Signed   By: Julian Hy M.D.   On: 02/06/2018 18:48   Nm Bone Scan Whole Body  Result Date: 02/17/2018 CLINICAL DATA:  77 year old female for staging of breast cancer. EXAM: NUCLEAR MEDICINE WHOLE BODY BONE SCAN TECHNIQUE: Whole body anterior and posterior images were obtained approximately 3 hours after intravenous injection of radiopharmaceutical. RADIOPHARMACEUTICALS:  21.0 mCi Technetium-16mMDP IV COMPARISON:  02/06/2018 chest abdomen and pelvis CT FINDINGS: Focus of increased activity within the anterior LEFT 2nd rib appears to represent a very subtle subacute fracture on 02/06/2018 CT. No other focal abnormalities noted suggestive of osseous metastatic disease. Degenerative changes in the knees and ankles  bilaterally noted. IMPRESSION: 1. Focal increased activity within the anterior LEFT 2nd rib appears to represent a very subtle subacute fracture on recent CT. Attention S area on future scans recommended. 2. No evidence of osseous metastatic disease otherwise. 3. Degenerative changes as described. Electronically Signed   By: JMargarette CanadaM.D.   On: 02/17/2018 14:22   Ct Abdomen Pelvis W Contrast  Result Date: 02/06/2018 CLINICAL DATA:  Bilateral breast cancer, status post neoadjuvant chemotherapy and bilateral mastectomy EXAM: CT CHEST, ABDOMEN, AND PELVIS WITH CONTRAST TECHNIQUE: Multidetector CT imaging of the chest, abdomen and pelvis was performed following the standard protocol during bolus administration of intravenous contrast. CONTRAST:  72mOMNIPAQUE IOHEXOL 300 MG/ML  SOLN COMPARISON:  None. FINDINGS: CT CHEST FINDINGS Cardiovascular: Heart is normal in size.  No pericardial effusion. No evidence of thoracic aortic aneurysm. Atherosclerotic calcifications of the aortic arch. Mediastinum/Nodes: Small mediastinal lymph nodes, including a 7 mm short axis subcarinal node. No suspicious hilar or axillary lymphadenopathy. Visualized thyroid is unremarkable. Lungs/Pleura: Radiation changes in the anterior upper lobes and lateral right middle/lower lobe. Mild centrilobular and paraseptal emphysematous changes. No focal consolidation. No suspicious pulmonary nodules. No pleural effusion or pneumothorax. Musculoskeletal: Mild degenerative changes of the thoracic spine. Status post bilateral mastectomy. CT ABDOMEN PELVIS FINDINGS Hepatobiliary: Liver is within normal limits. Gallbladder is unremarkable. No intrahepatic or extrahepatic ductal dilatation. Pancreas: Within normal limits. Spleen: Within normal limits. Adrenals/Urinary Tract: Adrenal glands are within normal limits. 2.4 cm right upper pole renal cyst (series 2/image 48). Left kidney is within normal limits. No hydronephrosis. Bladder is within normal  limits. Stomach/Bowel: Stomach is within normal limits. No evidence of bowel obstruction. Normal appendix (series 2/image 81). Vascular/Lymphatic: No evidence of abdominal aortic aneurysm. Atherosclerotic calcifications of the abdominal aorta and branch vessels. No suspicious abdominopelvic lymphadenopathy. Reproductive: Uterus is within normal limits. Bilateral ovaries are within normal limits. Other: No abdominopelvic ascites. Musculoskeletal: Grade 2 spondylolisthesis at L5-S1. Mild degenerative changes of the lumbar spine. IMPRESSION: Status post bilateral mastectomy. Radiation changes in the lungs bilaterally, upper lobe predominant. Small mediastinal lymph nodes, including a 7 mm short axis subcarinal node, within normal limits. No findings specific for recurrent or metastatic disease. Electronically Signed   By: SrJulian Hy.D.   On: 02/06/2018 18:48  Assessment and plan- Patient is a 77 y.o. female with bilateral breast cancer (stage is unclear and ER PR and her 2 neu status is also unclear) s/p neoadjuvant chemotherapy followed bilateral mastectomy without reconstruction, adjuvant chemotherapy, RT and 6 months of tamoxifen.  Tamoxifen restarted in July 2019  I have reviewed CT chest abdomen and pelvis images independently and discussed findings with the patient.  Currently there is no evidence of metastatic breast cancer seen  Patient will continue tamoxifen for 10 years.  I have discussed with the patient and her son who both understand him the that if she has any symptoms of leg swelling or redness she should let us know immediately as tamoxifen can increase the risk of DVT.  She will also need annual eye exams as tamoxifen can increase the risk of cataracts.  She should also let us know if she has any vaginal bleeding given the endometrial cancer risks associated with tamoxifen.  She will also continue with calcium and vitamin D at this time.    Osteoporosis: I am unable to prescribe  Fosamax for her as her creatinine clearance is between 33-35 remain the Fosamax is contraindicated.  I will look into other renally safe alternatives for her.   Visit Diagnosis 1. Encounter for follow-up surveillance of breast cancer      Dr. Randa Evens, MD, MPH [facility] [phone] 03/07/2018 3:31 PM

## 2018-03-07 NOTE — Progress Notes (Signed)
Little coughing at night

## 2018-05-19 ENCOUNTER — Encounter: Payer: Self-pay | Admitting: Oncology

## 2018-05-20 ENCOUNTER — Other Ambulatory Visit: Payer: Self-pay | Admitting: *Deleted

## 2018-05-20 ENCOUNTER — Encounter: Payer: Self-pay | Admitting: *Deleted

## 2018-05-26 ENCOUNTER — Ambulatory Visit: Payer: Self-pay | Admitting: Pharmacy Technician

## 2018-05-26 DIAGNOSIS — Z79899 Other long term (current) drug therapy: Secondary | ICD-10-CM

## 2018-05-27 NOTE — Progress Notes (Signed)
Completed [facility] application and contract.  Patient agreed to all terms of the [facility] contract.    Patient approved to receive medication assistance at [facility] as long as eligibility criteria continues to be met.   Provided patient with community resource material based on her particular needs.    Referred patient for MTM.  [facility]

## 2018-06-03 ENCOUNTER — Ambulatory Visit: Payer: Self-pay | Admitting: Oncology

## 2018-06-06 ENCOUNTER — Ambulatory Visit: Payer: Medicaid Other | Admitting: Oncology

## 2018-06-10 ENCOUNTER — Inpatient Hospital Stay: Payer: Self-pay | Attending: Oncology | Admitting: Oncology

## 2018-06-10 ENCOUNTER — Other Ambulatory Visit: Payer: Self-pay

## 2018-06-10 VITALS — BP 112/67 | HR 88 | Temp 96.6°F | Resp 18 | Wt 108.5 lb

## 2018-06-10 DIAGNOSIS — Z853 Personal history of malignant neoplasm of breast: Secondary | ICD-10-CM

## 2018-06-10 DIAGNOSIS — C778 Secondary and unspecified malignant neoplasm of lymph nodes of multiple regions: Secondary | ICD-10-CM | POA: Insufficient documentation

## 2018-06-10 DIAGNOSIS — M81 Age-related 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 fracture: Secondary | ICD-10-CM | POA: Insufficient documentation

## 2018-06-10 DIAGNOSIS — Z9221 Personal history of antineoplastic chemotherapy: Secondary | ICD-10-CM | POA: Insufficient documentation

## 2018-06-10 DIAGNOSIS — Z9013 Acquired absence of bilateral breasts and nipples: Secondary | ICD-10-CM | POA: Insufficient documentation

## 2018-06-10 DIAGNOSIS — C50911 Malignant neoplasm of unspecified site of right female breast: Secondary | ICD-10-CM | POA: Insufficient documentation

## 2018-06-10 DIAGNOSIS — E119 Type 2 diabetes mellitus without complications: Secondary | ICD-10-CM | POA: Insufficient documentation

## 2018-06-10 DIAGNOSIS — Z7984 Long term (current) use of oral hypoglycemic drugs: Secondary | ICD-10-CM | POA: Insufficient documentation

## 2018-06-10 DIAGNOSIS — E785 Hyperlipidemia, unspecified: Secondary | ICD-10-CM | POA: Insufficient documentation

## 2018-06-10 DIAGNOSIS — I10 Essential (primary) hypertension: Secondary | ICD-10-CM | POA: Insufficient documentation

## 2018-06-10 DIAGNOSIS — Z79810 Long term (current) use of selective estrogen receptor modulators (SERMs): Secondary | ICD-10-CM | POA: Insufficient documentation

## 2018-06-10 DIAGNOSIS — Z79899 Other long term (current) drug therapy: Secondary | ICD-10-CM | POA: Insufficient documentation

## 2018-06-10 DIAGNOSIS — C50912 Malignant neoplasm of unspecified site of left female breast: Secondary | ICD-10-CM | POA: Insufficient documentation

## 2018-06-10 DIAGNOSIS — Z08 Encounter for follow-up examination after completed treatment for malignant neoplasm: Secondary | ICD-10-CM

## 2018-06-10 DIAGNOSIS — Z170 Estrogen receptor positive status [ER+]: Secondary | ICD-10-CM | POA: Insufficient documentation

## 2018-06-10 DIAGNOSIS — E079 Disorder of thyroid, unspecified: Secondary | ICD-10-CM | POA: Insufficient documentation

## 2018-06-10 MED ORDER — TAMOXIFEN CITRATE 20 MG PO TABS: 20.0000 mg | ORAL_TABLET | Freq: Every day | ORAL | 3 refills | Status: DC

## 2018-06-10 NOTE — Progress Notes (Signed)
Here for follow up. Overall all is going good per pt and son.

## 2018-06-11 ENCOUNTER — Ambulatory Visit: Payer: Self-pay | Admitting: Pharmacist

## 2018-06-11 ENCOUNTER — Encounter: Payer: Self-pay | Admitting: Pharmacist

## 2018-06-11 VITALS — BP 118/62 | Ht <= 58 in | Wt 109.0 lb

## 2018-06-11 DIAGNOSIS — Z79899 Other long term (current) drug therapy: Secondary | ICD-10-CM

## 2018-06-11 NOTE — Progress Notes (Addendum)
[facility] Visit Note  Patient: Erin Wagner MRN: [id number] Date of Birth: [birth year] PCP: Virginia Crews, MD   [name] 77 y.o. female presents for an initial MTM visit today. An interpretor Radiographer, therapeutic [phone]) from [place] was used ([place] language). Patient was also accompanied by son who speaks [place]. Pt reported no signs/symptoms of being sick and no contact with anyone that has been sick. Pt has not traveled outside of the country in the past month.  BP 118/62 (BP Location: Left Arm, Patient Position: Sitting, Cuff Size: Small)   Ht 4\' 6"  (1.372 m)   Wt 109 lb (49.4 kg)   BMI 26.28 kg/m   Patient Information   Past Medical History:  Diagnosis Date  . Cancer ([place])    breast  . Cataract   . Diabetes mellitus without complication ([place])   . Hyperlipidemia   . Hypertension   . Thyroid disease       Past Surgical History:  Procedure Laterality Date  . BREAST SURGERY    . EYE SURGERY       Family History  Problem Relation Age of Onset  . Healthy Sister   . Healthy Brother   . Healthy Daughter   . Healthy Sister   . Healthy Daughter   . Healthy Daughter     New Diagnoses (since last visit):   Family Support: Good; pt's son manages medications/health conditions  Lifestyle Diet: diet was not discussed due to time limit Breakfast: Lunch: Dinner: Drinks:  Exercise: Pt reports doing stretches and walking at least 3 days a week, sometimes more.           Social History   Substance and Sexual Activity  Alcohol Use No  . Frequency: Never      Social History   Tobacco Use  Smoking Status Never Smoker  Smokeless Tobacco Never Used      Health Maintenance  Topic Date Due  . TETANUS/TDAP  06/06/1959  . PNA vac Low Risk Adult (1 of 2 - PCV13) 06/05/2005  . INFLUENZA VACCINE  02/06/2018  . HEMOGLOBIN A1C  06/14/2018  . OPHTHALMOLOGY EXAM  10/10/2018  . FOOT EXAM  12/14/2018  .  URINE MICROALBUMIN  12/14/2018  . DEXA SCAN  Completed   Outpatient Encounter Medications as of 06/11/2018  Medication Sig  . amLODipine (NORVASC) 5 MG tablet Take 1 tablet (5 mg total) by mouth daily.  Marland Kitchen atorvastatin (LIPITOR) 20 MG tablet Take 1 tablet (20 mg total) by mouth daily.  . Calcium Carbonate-Vitamin D (CALCIUM 600+D) 600-400 MG-UNIT tablet Take 1 tablet by mouth daily.   Marland Kitchen levothyroxine (SYNTHROID, LEVOTHROID) 25 MCG tablet Take 1/2 tablet daily  . metFORMIN (GLUCOPHAGE) 1000 MG tablet Take 0.5 tablets (500 mg total) by mouth 2 (two) times daily with a meal.  . Multiple Vitamin (MULTIVITAMIN) tablet Take 1 tablet by mouth daily.  . propranolol (INDERAL) 20 MG tablet Take 1 tablet (20 mg total) by mouth 2 (two) times daily. (Patient taking differently: Take 20 mg by mouth 2 (two) times daily. Pt taking once daily)  . ranitidine (ZANTAC) 150 MG tablet Take 1 tablet (150 mg total) by mouth at bedtime.  . sertraline (ZOLOFT) 25 MG tablet Take 1 tablet (25 mg total) by mouth daily.  . tamoxifen (NOLVADEX) 20 MG tablet Take 1 tablet (20 mg total) by mouth daily.  . benzonatate (TESSALON) 100 MG capsule Take 1 capsule (100 mg total) by mouth 2 (two) times daily as  needed for cough. (Patient not taking: Reported on 06/11/2018)   No facility-administered encounter medications on file as of 06/11/2018.    Health Maintenance/Date Completed  Last ED visit: never visited ED Last Visit to PCP: July 2019 Next Visit to PCP: 06-16-18 Specialist Visit: Oncologist: 06-10-18 Dental Exam: last year Eye Exam: 8 month ago Prostate Exam: na Pelvic/PAP Exam: no Mammogram: 2 months ago DEXA: na Colonoscopy: no  Flu Vaccine: no Pneumonia Vaccine: 2 years ago   Assessment and Plan: Adherence/Compliance: Pt reports not missing any doses. Pt's son handles medications. A pill organizer was given to further assist with this.   DM: metformin 500mg  Pt does not check BG at home, only when she goes to  clinic. Appears well managed based on recent labs. BG on 03-07-18: 116 A1c on 12-13-17: 5.8%  HTN: amlodipine 5mg , propranolol 20mg  Well managed. BP today was 118/62, on 06-10-18 112/67.  Depression: sertraline 25mg  Pt reports feeling well managed.   GERD: ranitidine 150mg  Pt reports feeling well managed.  Hyperlipidemia: atorvastatin 20mg  Pt well managed based on recent labs.  08-27-17: TC 133, TG 158, HDL 45, LDL 56  Hypothyroidism: levothyroxine 35mcg Well managed based on recent labs. TSH 08-27-17: 3.95  History of Breast Cancer: tamoxifen 20mg  Pt saw oncologist yesterday who manages this.  Pt and son were very friendly and understanding with using interpretor. Return to clinic in 1 year.  Hinton Dyer, PharmD [place] [facility]  Cosigned: Cleopatra Cedar. Dicky Doe, PharmD [facility] [place] Operations Coordinator [phone]

## 2018-06-12 ENCOUNTER — Encounter: Payer: Self-pay | Admitting: Oncology

## 2018-06-12 NOTE — Progress Notes (Signed)
Hematology/Oncology Consult note [facility]  Telephone:([phone] [phone]  Patient Care Team: Virginia Crews, MD as PCP - General (Family Medicine)   Name of the patient: Erin Wagner  [id number]  [birth year]   Date of visit: 06/12/18  Diagnosis- h/o b/l breast cancer  treated in [place] currently on tamoxifen  Chief complaint/ Reason for visit- routine f/u of breast cancer on tamoxifen  Heme/Onc history:  Patient is a 77 year old [place] speaking female from [place] with a past medical history significant for hypertension hyperlipidemia, diabetes and bilateral breast cancer.She is here with son and daughter in law. She has refused formal language interpretor and would like her family to interpret for her. Her family is comfortable speaking in Hindi with me and translating for her.  Her oncology history is as follows:  1.She was diagnosed with bilateral breast cancer in July 2017 in [place]. She initially received neoadjuvant chemotherapy with 3 cycles of epirubicin and cyclophosphamide. This was followed by bilateral modified radical mastectomy. I do not have the actual pathology report from [place]. 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 I am unable to decipher the written handwriting well. Appears to have 1 out of 13 lymph nodes positive with PNE positive, no LV I or perineural invasion. Left breast mass showed 6 cm mixed infiltrating ductal and lobular histology. Again handwritten notes mention 3 out of 16 lymph nodes positive. In some other page on theoutside report, the right breast cancer has been mentioned as T2 N0 in the left breast cancer has been mentioned as T4N0.  2.Following surgery she had 1 cycle of epirubicin and cyclophosphamide and 2 cycles of doxorubicin. She then had IMRT to her bilateral breasts  over 5 weeks. She was then asked to take tamoxifen for 6 months only. There is no mention about ER PR and HER-2 status anywhere in the outside reports. Patient was asked to take tamoxifen for 6 months following radiation and then stop it.   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  Patient had a bone density scan in July 2019 which showed significant osteoporosis.  Since definitive evidence of ER status was not obtainable, given history of bilateral node positive breast cancer and was to proceed with tamoxifen assuming that it was ER PR positive.  Tamoxifen 20 mg started July 2019.  Fosamax weekly started for osteoporosis.  She also had CT chest abdomen and pelvis as well as bone scan which did not reveal any evidence of malignancy.  Focally increased activity in the left second rib to the presence of subacute fracture.   Interval history- she is here with her son. She speaks limited hindi which I can speak and understand. She reports doing well. She is toleratign tamoxifen without any side effects. She still has occasional pain in her left arm from prior surgery. Denies other complaints  ECOG PS- 1 Pain scale- 0 Opioid associated constipation- np  Review of systems- Review of Systems  Constitutional: Negative for chills, fever, malaise/fatigue and weight loss.  HENT: Negative for congestion, ear discharge and nosebleeds.   Eyes: Negative for blurred vision.  Respiratory: Negative for cough, hemoptysis, sputum production, shortness of breath and wheezing.   Cardiovascular: Negative for chest pain, palpitations, orthopnea and  claudication.  Gastrointestinal: Negative for abdominal pain, blood in stool, constipation, diarrhea, heartburn, melena, nausea and vomiting.  Genitourinary: Negative for dysuria, flank pain, frequency, hematuria and urgency.    Musculoskeletal: Negative for back pain, joint pain and myalgias.  Skin: Negative for rash.  Neurological: Negative for dizziness, tingling, focal weakness, seizures, weakness and headaches.  Endo/Heme/Allergies: Does not bruise/bleed easily.  Psychiatric/Behavioral: Negative for depression and suicidal ideas. The patient does not have insomnia.       No Known Allergies   Past Medical History:  Diagnosis Date  . Cancer ([place])    breast  . Cataract   . Diabetes mellitus without complication ([place])   . Hyperlipidemia   . Hypertension   . Thyroid disease      Past Surgical History:  Procedure Laterality Date  . BREAST SURGERY    . EYE SURGERY      Social History   Socioeconomic History  . Marital status: Widowed    Spouse name: Not on file  . Number of children: 4  . Years of education: Not on file  . Highest education level: Not on file  Occupational History    Employer: retired    Comment: homemaker  Social Needs  . Financial resource strain: Not on file  . Food insecurity:    Worry: Not on file    Inability: Not on file  . Transportation needs:    Medical: Not on file    Non-medical: Not on file  Tobacco Use  . Smoking status: Never Smoker  . Smokeless tobacco: Never Used  Substance and Sexual Activity  . Alcohol use: No    Frequency: Never  . Drug use: No  . Sexual activity: Not on file  Lifestyle  . Physical activity:    Days per week: 3 days    Minutes per session: 20 min  . Stress: Not on file  Relationships  . Social connections:    Talks on phone: Not on file    Gets together: Not on file    Attends religious service: Not on file    Active member of club or organization: Not on file    Attends meetings of clubs or organizations: Not on file    Relationship status: Not on file  . Intimate partner violence:    Fear of current or ex partner: Not on file    Emotionally abused: Not on file    Physically abused: Not on file    Forced sexual  activity: Not on file  Other Topics Concern  . Not on file  Social History Narrative  . Not on file    Family History  Problem Relation Age of Onset  . Healthy Sister   . Healthy Brother   . Healthy Daughter   . Healthy Sister   . Healthy Daughter   . Healthy Daughter      Current Outpatient Medications:  .  amLODipine (NORVASC) 5 MG tablet, Take 1 tablet (5 mg total) by mouth daily., Disp: 90 tablet, Rfl: 3 .  atorvastatin (LIPITOR) 20 MG tablet, Take 1 tablet (20 mg total) by mouth daily., Disp: 90 tablet, Rfl: 3 .  benzonatate (TESSALON) 100 MG capsule, Take 1 capsule (100 mg total) by mouth 2 (two) times daily as needed for cough. (Patient not taking: Reported on 06/11/2018), Disp: 30 capsule, Rfl: 0 .  Calcium Carbonate-Vitamin D (CALCIUM 600+D) 600-400 MG-UNIT tablet, Take 1 tablet by mouth daily. , Disp: , Rfl:  .  levothyroxine (SYNTHROID, LEVOTHROID)  25 MCG tablet, Take 1/2 tablet daily, Disp: 45 tablet, Rfl: 3 .  metFORMIN (GLUCOPHAGE) 1000 MG tablet, Take 0.5 tablets (500 mg total) by mouth 2 (two) times daily with a meal., Disp: 90 tablet, Rfl: 3 .  propranolol (INDERAL) 20 MG tablet, Take 1 tablet (20 mg total) by mouth 2 (two) times daily. (Patient taking differently: Take 20 mg by mouth 2 (two) times daily. Pt taking once daily), Disp: 180 tablet, Rfl: 3 .  ranitidine (ZANTAC) 150 MG tablet, Take 1 tablet (150 mg total) by mouth at bedtime., Disp: 90 tablet, Rfl: 3 .  sertraline (ZOLOFT) 25 MG tablet, Take 1 tablet (25 mg total) by mouth daily., Disp: 90 tablet, Rfl: 3 .  tamoxifen (NOLVADEX) 20 MG tablet, Take 1 tablet (20 mg total) by mouth daily., Disp: 90 tablet, Rfl: 3 .  Multiple Vitamin (MULTIVITAMIN) tablet, Take 1 tablet by mouth daily., Disp: , Rfl:   Physical exam:  Vitals:   06/10/18 1443  BP: 112/67  Pulse: 88  Resp: 18  Temp: (!) 96.6 F (35.9 C)  TempSrc: Tympanic  Weight: 108 lb 8 oz (49.2 kg)   Physical Exam  Constitutional: She is oriented to  person, place, and time. She appears well-developed and well-nourished.  HENT:  Head: Normocephalic and atraumatic.  Eyes: Pupils are equal, round, and reactive to light. EOM are normal.  Neck: Normal range of motion.  Cardiovascular: Normal rate, regular rhythm and normal heart sounds.  Pulmonary/Chest: Effort normal and breath sounds normal.  Abdominal: Soft. Bowel sounds are normal.  Neurological: She is alert and oriented to person, place, and time.  Skin: Skin is warm and dry.  Patient is status post bilateral mastectomy without reconstruction.  No evidence of palpable bilateral axillary adenopathy or chest wall recurrence  CMP Latest Ref Rng & Units 03/07/2018  Glucose 70 - 99 mg/dL 116(H)  BUN 8 - 23 mg/dL 15  Creatinine 0.44 - 1.00 mg/dL 0.85  Sodium 135 - 145 mmol/L 131(L)  Potassium 3.5 - 5.1 mmol/L 4.1  Chloride 98 - 111 mmol/L 98  CO2 22 - 32 mmol/L 24  Calcium 8.9 - 10.3 mg/dL 9.6  Total Protein 6.5 - 8.1 g/dL 7.5  Total Bilirubin 0.3 - 1.2 mg/dL 0.4  Alkaline Phos 38 - 126 U/L 42  AST 15 - 41 U/L 24  ALT 0 - 44 U/L 17   CBC Latest Ref Rng & Units 01/24/2018  WBC 3.6 - 11.0 K/uL 9.9  Hemoglobin 12.0 - 16.0 g/dL 11.8(L)  Hematocrit 35.0 - 47.0 % 34.5(L)  Platelets 150 - 440 K/uL 296     Assessment and plan- Patient is a 77 y.o. female with bilateral breast cancer (stage is unclear and ER PR and her 2 neu status is also unclear) s/p neoadjuvant chemotherapy followed bilateral mastectomy without reconstruction, adjuvant chemotherapy, RT and 6 months of tamoxifen in 2017.  Tamoxifen restarted in July 2019  Clinically she is doing well and there is no evidence of recurrence on today's exam.  Her appetite is good and she denies any unintentional weight loss.  She is tolerating tamoxifen well without any side effects.  Tamoxifen was chosen over AI due to significant osteoporosis.  She is unable to take oral bisphosphonates as her creatinine clearance was between 33-35.  I  will plan on getting a repeat bone density scan next year.  I will see her back in 6 months no labs   Visit Diagnosis 1. Encounter for follow-up surveillance of breast cancer  Dr. Randa Evens, MD, MPH [facility] [phone] 06/12/2018 8:37 AM

## 2018-06-16 ENCOUNTER — Ambulatory Visit (INDEPENDENT_AMBULATORY_CARE_PROVIDER_SITE_OTHER): Payer: Self-pay | Admitting: Family Medicine

## 2018-06-16 ENCOUNTER — Encounter: Payer: Self-pay | Admitting: Family Medicine

## 2018-06-16 VITALS — BP 123/70 | HR 77 | Temp 97.9°F | Wt 108.8 lb

## 2018-06-16 DIAGNOSIS — E119 Type 2 diabetes mellitus without complications: Secondary | ICD-10-CM

## 2018-06-16 DIAGNOSIS — E1169 Type 2 diabetes mellitus with other specified complication: Secondary | ICD-10-CM

## 2018-06-16 DIAGNOSIS — F329 Major depressive disorder, single episode, unspecified: Secondary | ICD-10-CM

## 2018-06-16 DIAGNOSIS — F32A Depression, unspecified: Secondary | ICD-10-CM

## 2018-06-16 DIAGNOSIS — E039 Hypothyroidism, unspecified: Secondary | ICD-10-CM

## 2018-06-16 DIAGNOSIS — K219 Gastro-esophageal reflux disease without esophagitis: Secondary | ICD-10-CM

## 2018-06-16 DIAGNOSIS — I10 Essential (primary) hypertension: Secondary | ICD-10-CM

## 2018-06-16 DIAGNOSIS — E785 Hyperlipidemia, unspecified: Secondary | ICD-10-CM

## 2018-06-16 LAB — POCT GLYCOSYLATED HEMOGLOBIN (HGB A1C): Hemoglobin A1C: 6.1 % — AB (ref 4.0–5.6)

## 2018-06-16 MED ORDER — LEVOTHYROXINE SODIUM 25 MCG PO TABS: ORAL_TABLET | ORAL | 3 refills | Status: DC

## 2018-06-16 MED ORDER — ATORVASTATIN CALCIUM 20 MG PO TABS: 20.0000 mg | ORAL_TABLET | Freq: Every day | ORAL | 3 refills | Status: DC

## 2018-06-16 MED ORDER — METFORMIN HCL 1000 MG PO TABS: 500.0000 mg | ORAL_TABLET | Freq: Two times a day (BID) | ORAL | 3 refills | Status: DC

## 2018-06-16 MED ORDER — FAMOTIDINE 20 MG PO TABS: 20.0000 mg | ORAL_TABLET | Freq: Two times a day (BID) | ORAL | 3 refills | Status: DC

## 2018-06-16 MED ORDER — AMLODIPINE BESYLATE 5 MG PO TABS: 5.0000 mg | ORAL_TABLET | Freq: Every day | ORAL | 3 refills | Status: DC

## 2018-06-16 MED ORDER — PROPRANOLOL HCL 20 MG PO TABS: 20.0000 mg | ORAL_TABLET | Freq: Two times a day (BID) | ORAL | 3 refills | Status: DC

## 2018-06-16 MED ORDER — SERTRALINE HCL 25 MG PO TABS: 25.0000 mg | ORAL_TABLET | Freq: Every day | ORAL | 3 refills | Status: DC

## 2018-06-16 NOTE — Progress Notes (Signed)
Patient: Erin Wagner Female    DOB: 10-23-1940   77 y.o.   MRN: [id number] Visit Date: 06/16/2018  Today's Provider: Lavon Paganini, MD   Chief Complaint  Patient presents with  . Diabetes  . Hypertension   Subjective:    I, Tiburcio Pea, CMA, am acting as a Education administrator for Lavon Paganini, MD.   History is provided by the patient's son, who also interprets for her.  She is [place] speaking.  They declined interpreter services  HPI  Diabetes Mellitus Type II, Follow-up:    Lab Results  Component Value Date   HGBA1C 6.1 (A) 06/16/2018   Last seen for diabetes 6 months ago.  Management since then includes continue Metformin. She reports excellent compliance with treatment. She is not having side effects. Current symptoms include none Home blood sugar records: not being checked at home.  Episodes of hypoglycemia? no              Current Insulin Regimen: None Most Recent Eye Exam: 10/09/17 Weight trend: stable Prior visit with dietician: no Current diet: in general, a "healthy" diet   Current exercise: no regular exercise, just walking some  Pertinent Labs: Lab Results  Component Value Date   CHOL 133 06/27/2017   HDL 45 06/27/2017   LDLCALC 56 06/27/2017   TRIG 158 (H) 06/27/2017   CHOLHDL 3.0 06/27/2017       Wt Readings from Last 3 Encounters:  09/24/17 105 lb 14.4 oz (48 kg)  09/09/17 104 lb 12.8 oz (47.5 kg)  09/03/17 106 lb 4.8 oz (48.2 kg)    ------------------------------------------------------------------------  Hypertension, follow-up:  BP Readings from Last 3 Encounters:  06/16/18 123/70  06/11/18 118/62  06/10/18 112/67    She was last seen for hypertension 6 months ago.  BP at that visit was 122/50. Management since that visit includes continuing medications. She reports excellent compliance with treatment. She is not having side effects. She is adherent to low salt diet.   Outside blood pressures are not being  checked. She is experiencing none.  Patient denies chest pain, chest pressure/discomfort, claudication, dyspnea, exertional chest pressure/discomfort, fatigue, irregular heart beat, lower extremity edema, near-syncope, orthopnea, palpitations, paroxysmal nocturnal dyspnea, syncope and tachypnea.   Cardiovascular risk factors include advanced age (older than 13 for men, 78 for women), diabetes mellitus, dyslipidemia and hypertension.  Use of agents associated with hypertension: thyroid hormones.                Hypothyrodiism: Patient is taking half of a 25 mcg Synthroid tablet daily with good compliance.  Denies any skin/hair changes, weight loss/weight gain, constipation, diarrhea, heat/cold intolerance  Depression: Feels like this is well controlled on her small dose of Zoloft.  She is tolerating this well without any side effects  GERD: Patient has been taking ranitidine with good compliance.  They did not know about the recall.  They are willing to switch to Pepcid ------------------------------------------------------------------------  No Known Allergies   Current Outpatient Medications:  .  amLODipine (NORVASC) 5 MG tablet, Take 1 tablet (5 mg total) by mouth daily., Disp: 90 tablet, Rfl: 3 .  atorvastatin (LIPITOR) 20 MG tablet, Take 1 tablet (20 mg total) by mouth daily., Disp: 90 tablet, Rfl: 3 .  Calcium Carbonate-Vitamin D (CALCIUM 600+D) 600-400 MG-UNIT tablet, Take 1 tablet by mouth daily. , Disp: , Rfl:  .  levothyroxine (SYNTHROID, LEVOTHROID) 25 MCG tablet, Take 1/2 tablet daily, Disp: 45 tablet, Rfl: 3 .  metFORMIN (  GLUCOPHAGE) 1000 MG tablet, Take 0.5 tablets (500 mg total) by mouth 2 (two) times daily with a meal., Disp: 90 tablet, Rfl: 3 .  Multiple Vitamin (MULTIVITAMIN) tablet, Take 1 tablet by mouth daily., Disp: , Rfl:  .  propranolol (INDERAL) 20 MG tablet, Take 1 tablet (20 mg total) by mouth 2 (two) times daily. (Patient taking differently: Take 20 mg by mouth 2  (two) times daily. Pt taking once daily), Disp: 180 tablet, Rfl: 3 .  ranitidine (ZANTAC) 150 MG tablet, Take 1 tablet (150 mg total) by mouth at bedtime., Disp: 90 tablet, Rfl: 3 .  sertraline (ZOLOFT) 25 MG tablet, Take 1 tablet (25 mg total) by mouth daily., Disp: 90 tablet, Rfl: 3 .  tamoxifen (NOLVADEX) 20 MG tablet, Take 1 tablet (20 mg total) by mouth daily., Disp: 90 tablet, Rfl: 3  Review of Systems  Constitutional: Negative.   Respiratory: Negative.   Cardiovascular: Negative.   Endocrine: Negative.   Musculoskeletal: Negative.     Social History   Tobacco Use  . Smoking status: Never Smoker  . Smokeless tobacco: Never Used  Substance Use Topics  . Alcohol use: No    Frequency: Never   Objective:   BP 123/70 (BP Location: Left Arm, Patient Position: Sitting, Cuff Size: Normal)   Pulse 77   Temp 97.9 F (36.6 C) (Oral)   Wt 108 lb 12.8 oz (49.4 kg)   SpO2 99%   BMI 26.23 kg/m  Vitals:   06/16/18 1548  BP: 123/70  Pulse: 77  Temp: 97.9 F (36.6 C)  TempSrc: Oral  SpO2: 99%  Weight: 108 lb 12.8 oz (49.4 kg)     Physical Exam  Constitutional: She is oriented to person, place, and time. She appears well-developed and well-nourished. No distress.  HENT:  Head: Normocephalic and atraumatic.  Mouth/Throat: Oropharynx is clear and moist.  Eyes: Conjunctivae are normal. No scleral icterus.  Neck: Neck supple. No thyromegaly present.  Cardiovascular: Normal rate, regular rhythm, normal heart sounds and intact distal pulses.  No murmur heard. Pulmonary/Chest: Effort normal and breath sounds normal. No respiratory distress. She has no wheezes.  Abdominal: Soft. She exhibits no distension. There is no tenderness.  Musculoskeletal: She exhibits no edema.  Lymphadenopathy:    She has no cervical adenopathy.  Neurological: She is alert and oriented to person, place, and time.  Skin: Skin is warm and dry. Capillary refill takes less than 2 seconds. No rash noted.    Psychiatric: She has a normal mood and affect. Her behavior is normal.  Vitals reviewed.   Results for orders placed or performed in visit on 06/16/18  POCT HgB A1C  Result Value Ref Range   Hemoglobin A1C 6.1 (A) 4.0 - 5.6 %   HbA1c POC (<> result, manual entry)     HbA1c, POC (prediabetic range)     HbA1c, POC (controlled diabetic range)         Assessment & Plan:   Problem List Items Addressed This Visit      Cardiovascular and Mediastinum   Hypertension    Well-controlled Continue current medications Recheck metabolic panel-they will wait to get lab work until there are charity care is available as she is currently uninsured Follow-up in 6 months      Relevant Medications   amLODipine (NORVASC) 5 MG tablet   atorvastatin (LIPITOR) 20 MG tablet   propranolol (INDERAL) 20 MG tablet   Other Relevant Orders   Comprehensive metabolic panel  Digestive   GERD (gastroesophageal reflux disease)    Well-controlled currently Due to recall, we will switch from ranitidine to famotidine      Relevant Medications   famotidine (PEPCID) 20 MG tablet     Endocrine   Diabetes mellitus without complication ([employer]) - Primary    Well-controlled with A1c of 6.1 Denies any hypoglycemia Continue metformin at current dose Up-to-date on screenings and declines vaccinations Follow-up in 6 months      Relevant Medications   atorvastatin (LIPITOR) 20 MG tablet   metFORMIN (GLUCOPHAGE) 1000 MG tablet   Other Relevant Orders   POCT HgB A1C (Completed)   Hyperlipidemia associated with type 2 diabetes mellitus ([facility])    Previously well controlled Discussed goal LDL less than 70 Continue atorvastatin 20 mg daily Recheck fasting lipid panel      Relevant Medications   atorvastatin (LIPITOR) 20 MG tablet   metFORMIN (GLUCOPHAGE) 1000 MG tablet   Other Relevant Orders   Lipid panel   Comprehensive metabolic panel     Other   Depression    Well-controlled without any  symptoms Continue low-dose Zoloft She is doing well, could consider stopping this in the future      Relevant Medications   sertraline (ZOLOFT) 25 MG tablet    Other Visit Diagnoses    Hypothyroidism, unspecified type       Relevant Medications   levothyroxine (SYNTHROID, LEVOTHROID) 25 MCG tablet   propranolol (INDERAL) 20 MG tablet   Other Relevant Orders   TSH       Return in about 6 months (around 12/16/2018) for chronic disease f/u.   The entirety of the information documented in the History of Present Illness, Review of Systems and Physical Exam were personally obtained by me. Portions of this information were initially documented by Tiburcio Pea, CMA and reviewed by me for thoroughness and accuracy.    Burech Mcfarland, Dionne Bucy, MD, MPH [facility]

## 2018-06-18 NOTE — Assessment & Plan Note (Signed)
Well-controlled without any symptoms Continue low-dose Zoloft She is doing well, could consider stopping this in the future

## 2018-06-18 NOTE — Assessment & Plan Note (Signed)
Well-controlled Continue current medications Recheck metabolic panel-they will wait to get lab work until there are charity care is available as she is currently uninsured Follow-up in 6 months

## 2018-06-18 NOTE — Assessment & Plan Note (Signed)
Well-controlled with A1c of 6.1 Denies any hypoglycemia Continue metformin at current dose Up-to-date on screenings and declines vaccinations Follow-up in 6 months

## 2018-06-18 NOTE — Assessment & Plan Note (Signed)
Well-controlled currently Due to recall, we will switch from ranitidine to famotidine

## 2018-06-18 NOTE — Assessment & Plan Note (Signed)
Previously well controlled Discussed goal LDL less than 70 Continue atorvastatin 20 mg daily Recheck fasting lipid panel

## 2018-07-10 ENCOUNTER — Encounter: Payer: Self-pay | Admitting: Family Medicine

## 2018-07-14 MED ORDER — FAMOTIDINE 20 MG PO TABS: 20.0000 mg | ORAL_TABLET | Freq: Two times a day (BID) | ORAL | 3 refills | Status: DC

## 2018-07-15 ENCOUNTER — Telehealth: Payer: Self-pay

## 2018-07-15 DIAGNOSIS — E875 Hyperkalemia: Secondary | ICD-10-CM

## 2018-07-15 LAB — COMPREHENSIVE METABOLIC PANEL
ALT: 15 IU/L (ref 0–32)
AST: 22 IU/L (ref 0–40)
Albumin/Globulin Ratio: 1.4 (ref 1.2–2.2)
Albumin: 4 g/dL (ref 3.5–4.8)
Alkaline Phosphatase: 38 IU/L — ABNORMAL LOW (ref 39–117)
BUN/Creatinine Ratio: 12 (ref 12–28)
BUN: 12 mg/dL (ref 8–27)
Bilirubin Total: 0.3 mg/dL (ref 0.0–1.2)
CO2: 22 mmol/L (ref 20–29)
Calcium: 9.9 mg/dL (ref 8.7–10.3)
Chloride: 97 mmol/L (ref 96–106)
Creatinine, Ser: 0.98 mg/dL (ref 0.57–1.00)
GFR calc Af Amer: 64 mL/min/{1.73_m2} (ref >59)
GFR calc non Af Amer: 55 mL/min/{1.73_m2} — ABNORMAL LOW (ref >59)
Globulin, Total: 2.8 g/dL (ref 1.5–4.5)
Glucose: 119 mg/dL — ABNORMAL HIGH (ref 65–99)
Potassium: 5.4 mmol/L — ABNORMAL HIGH (ref 3.5–5.2)
Sodium: 134 mmol/L (ref 134–144)
Total Protein: 6.8 g/dL (ref 6.0–8.5)

## 2018-07-15 LAB — LIPID PANEL
CHOL/HDL RATIO: 2.9 ratio (ref 0.0–4.4)
Cholesterol, Total: 133 mg/dL (ref 100–199)
HDL: 46 mg/dL (ref >39)
LDL Calculated: 47 mg/dL (ref 0–99)
Triglycerides: 200 mg/dL — ABNORMAL HIGH (ref 0–149)
VLDL Cholesterol Cal: 40 mg/dL (ref 5–40)

## 2018-07-15 LAB — TSH: TSH: 3.52 u[IU]/mL (ref 0.450–4.500)

## 2018-07-15 NOTE — Telephone Encounter (Signed)
-----   Message from Virginia Crews, MD sent at 07/15/2018  8:35 AM EST ----- Normal labs, except slightly elevated blood sugar, slightly elevated potassium.  Ensure that patient is not taking any potassium supplements and see if she can come back in ~1 wk to recheck

## 2018-07-15 NOTE — Telephone Encounter (Signed)
LMTCB

## 2018-07-16 NOTE — Telephone Encounter (Signed)
Pt's son called back needing results from labs for mother.  Please call son - he speaks [place].  Thanks, [employer]

## 2018-07-17 NOTE — Telephone Encounter (Signed)
LMTCB

## 2018-07-17 NOTE — Telephone Encounter (Signed)
Pt's son returned missed call. Please call back.  Thanks, [employer]

## 2018-07-18 NOTE — Telephone Encounter (Signed)
Patients son advised as below. sd

## 2018-08-22 IMAGING — CT CT ABD-PELV W/ CM
2 of 5 series · 14 of 46 positions shown, 16 images · IV contrast (omnipaque)
Comparison: None.

CLINICAL DATA: Bilateral breast cancer, status post neoadjuvant
chemotherapy and bilateral mastectomy

EXAM:
CT CHEST, ABDOMEN, AND PELVIS WITH CONTRAST
TECHNIQUE: Multidetector CT imaging of the chest, abdomen and pelvis was
performed following the standard protocol during bolus
administration of intravenous contrast.
CONTRAST:  75mL OMNIPAQUE IOHEXOL 300 MG/ML  SOLN

[Series 2: cap with · axial · 0.67mm/px · z∈[-942,-502]mm · 11 of 106 slices shown, 13 images]
[im 9/106  soft-tissue]
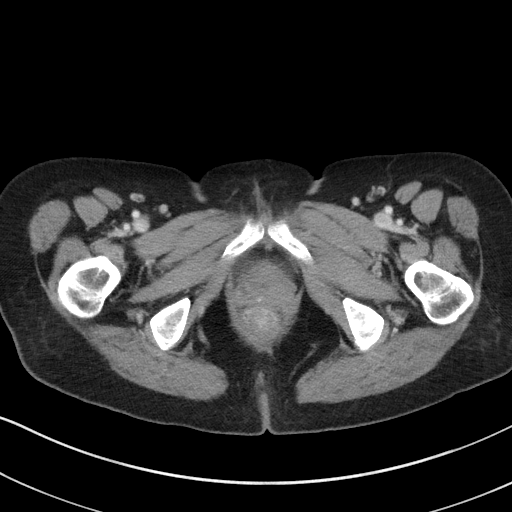
[im 9/106  bone]
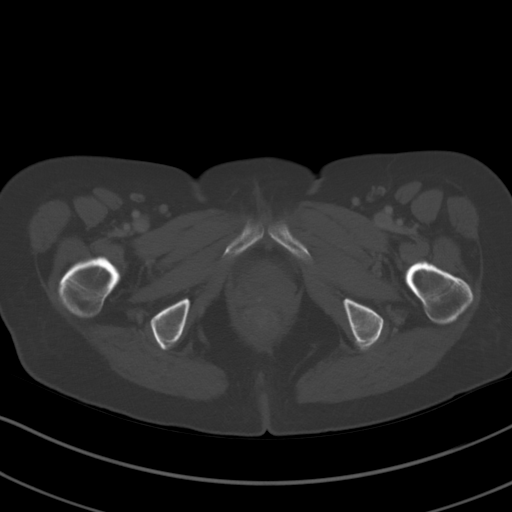
[im 18/106  soft-tissue]
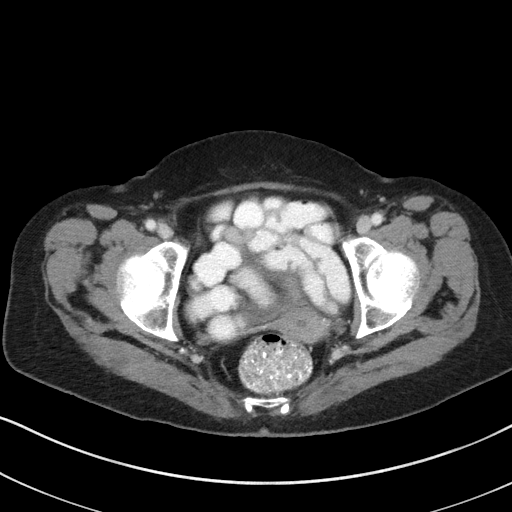
[im 27/106  soft-tissue]
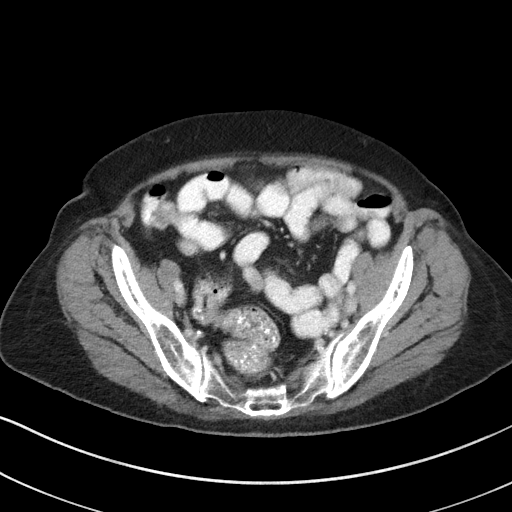
[im 36/106  soft-tissue]
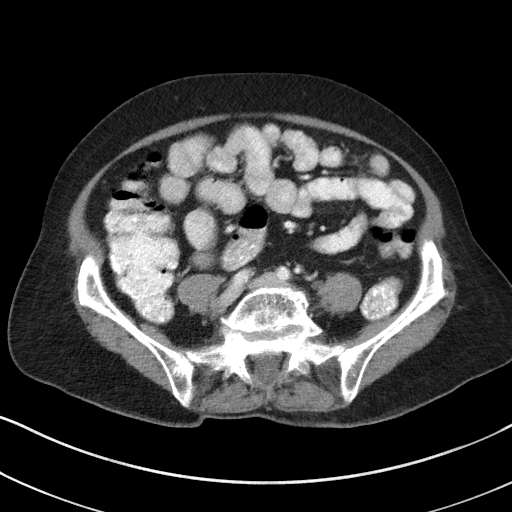
[im 44/106  soft-tissue]
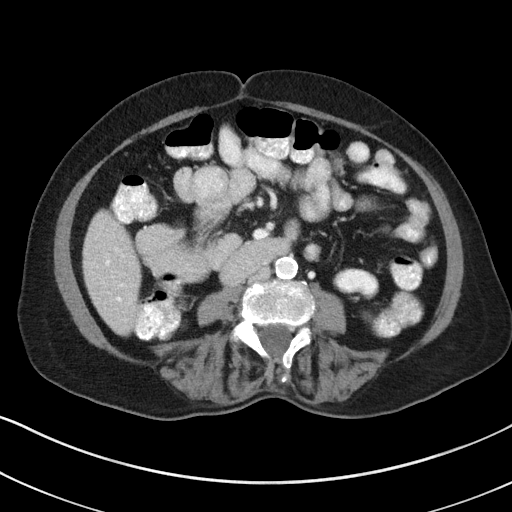
[im 53/106  soft-tissue]
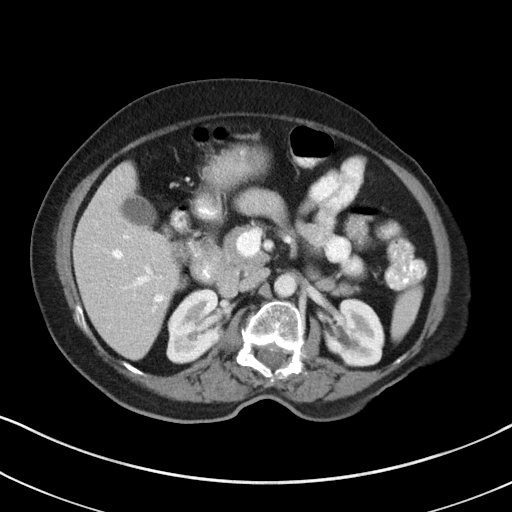
[im 62/106  soft-tissue]
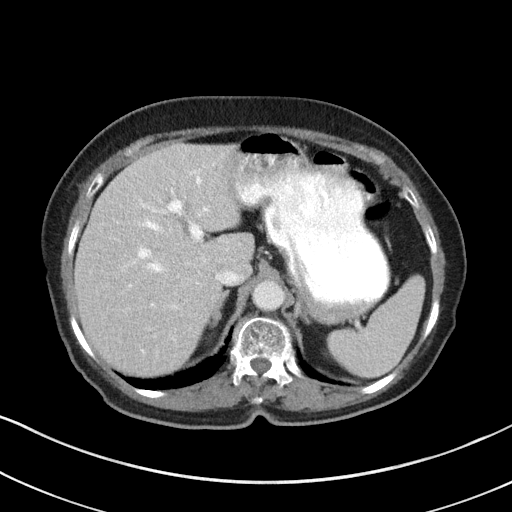
[im 71/106  soft-tissue]
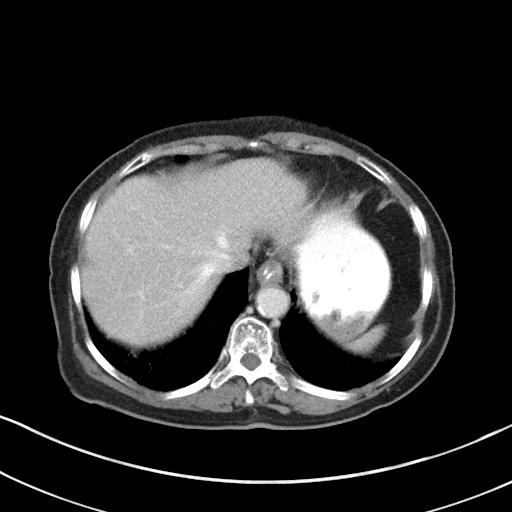
[im 79/106  soft-tissue]
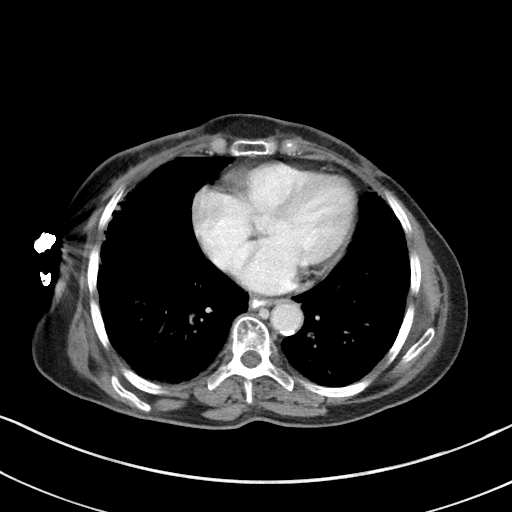
[im 79/106  bone]
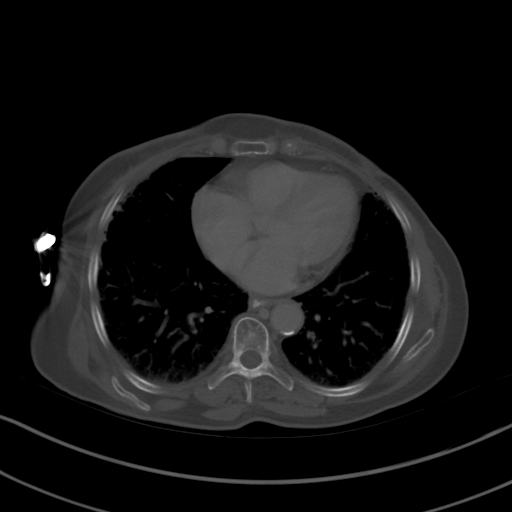
[im 88/106  soft-tissue]
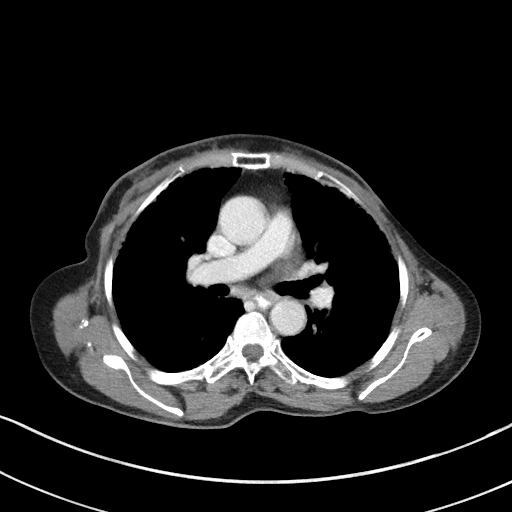
[im 97/106  soft-tissue]
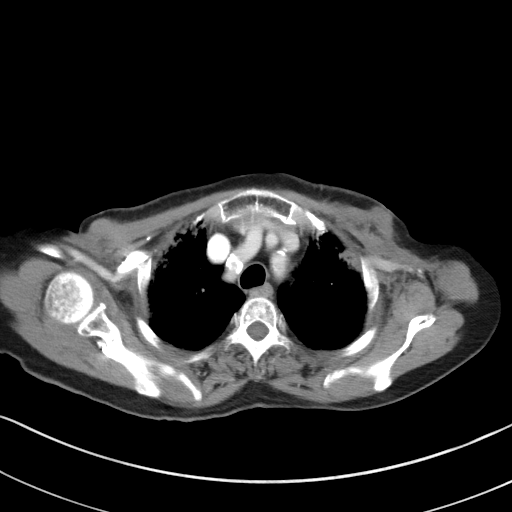

[Series 5: coronals · coronal · 0.71mm/px · 3 of 107 slices shown]
[im 36/107  soft-tissue]
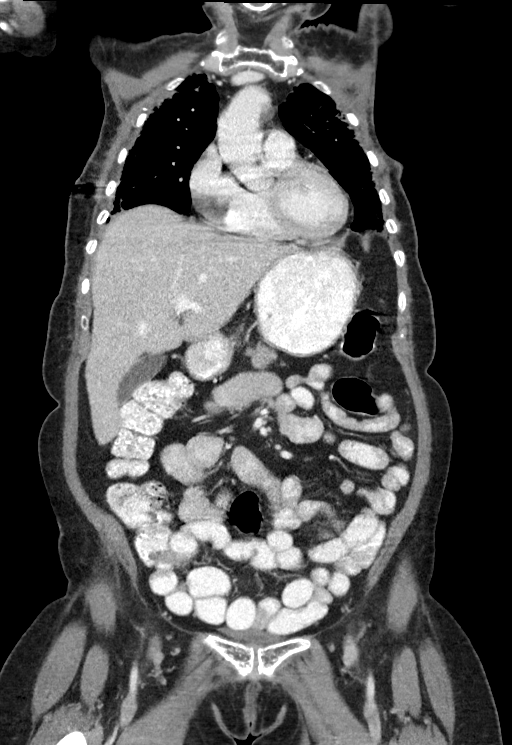
[im 48/107  soft-tissue]
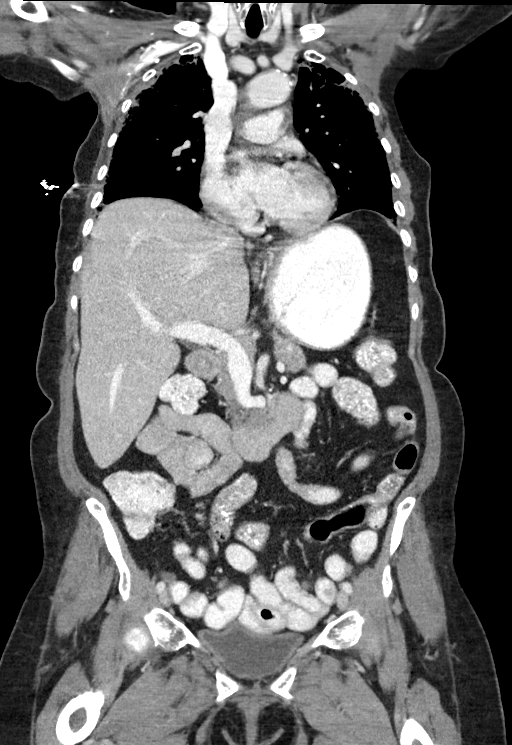
[im 59/107  soft-tissue]
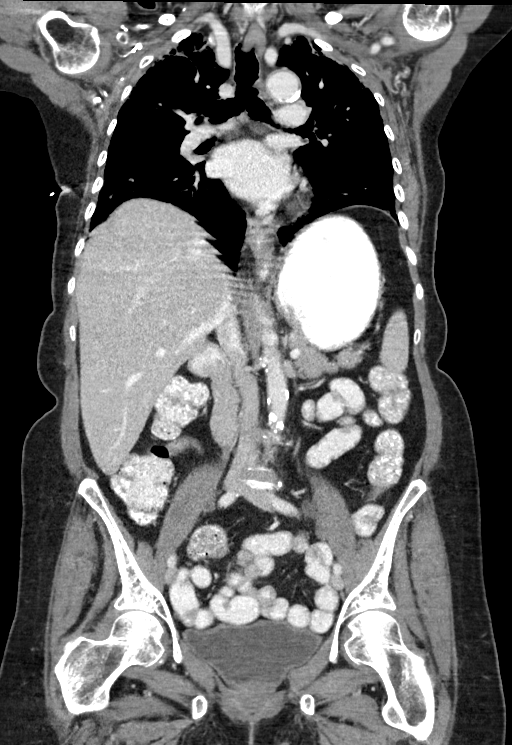

[14 of 46 positions shown; findings below may reference images not displayed]

FINDINGS: CT CHEST FINDINGS

Cardiovascular: Heart is normal in size.  No pericardial effusion.

No evidence of thoracic aortic aneurysm. Atherosclerotic
calcifications of the aortic arch.

Mediastinum/Nodes: Small mediastinal lymph nodes, including a 7 mm
short axis subcarinal node. No suspicious hilar or axillary
lymphadenopathy.

Visualized thyroid is unremarkable.

Lungs/Pleura: Radiation changes in the anterior upper lobes and
lateral right middle/lower lobe.

Mild centrilobular and paraseptal emphysematous changes.

No focal consolidation.

No suspicious pulmonary nodules.

No pleural effusion or pneumothorax.

Musculoskeletal: Mild degenerative changes of the thoracic spine.

Status post bilateral mastectomy.

CT ABDOMEN PELVIS FINDINGS

Hepatobiliary: Liver is within normal limits.

Gallbladder is unremarkable. No intrahepatic or extrahepatic ductal
dilatation.

Pancreas: Within normal limits.

Spleen: Within normal limits.

Adrenals/Urinary Tract: Adrenal glands are within normal limits.

2.4 cm right upper pole renal cyst (series 2/image 48). Left kidney
is within normal limits. No hydronephrosis.

Bladder is within normal limits.

Stomach/Bowel: Stomach is within normal limits.

No evidence of bowel obstruction.

Normal appendix (series 2/image 81).

Vascular/Lymphatic: No evidence of abdominal aortic aneurysm.

Atherosclerotic calcifications of the abdominal aorta and branch
vessels.

No suspicious abdominopelvic lymphadenopathy.

Reproductive: Uterus is within normal limits.

Bilateral ovaries are within normal limits.

Other: No abdominopelvic ascites.

Musculoskeletal: Grade 2 spondylolisthesis at L5-S1.

Mild degenerative changes of the lumbar spine.
IMPRESSION: Status post bilateral mastectomy.

Radiation changes in the lungs bilaterally, upper lobe predominant.

Small mediastinal lymph nodes, including a 7 mm short axis
subcarinal node, within normal limits.

No findings specific for recurrent or metastatic disease.

## 2018-11-10 ENCOUNTER — Ambulatory Visit (INDEPENDENT_AMBULATORY_CARE_PROVIDER_SITE_OTHER): Payer: Self-pay | Admitting: Family Medicine

## 2018-11-10 ENCOUNTER — Encounter: Payer: Self-pay | Admitting: Family Medicine

## 2018-11-10 VITALS — BP 120/63 | HR 91

## 2018-11-10 DIAGNOSIS — E785 Hyperlipidemia, unspecified: Secondary | ICD-10-CM

## 2018-11-10 DIAGNOSIS — I10 Essential (primary) hypertension: Secondary | ICD-10-CM

## 2018-11-10 DIAGNOSIS — E1169 Type 2 diabetes mellitus with other specified complication: Secondary | ICD-10-CM

## 2018-11-10 LAB — POCT GLYCOSYLATED HEMOGLOBIN (HGB A1C): Hemoglobin A1C: 6.5 % — AB (ref 4.0–5.6)

## 2018-11-10 MED ORDER — AMLODIPINE BESYLATE 2.5 MG PO TABS: 2.5000 mg | ORAL_TABLET | Freq: Every day | ORAL | 1 refills | Status: DC

## 2018-11-10 NOTE — Assessment & Plan Note (Signed)
Previously well controlled Continue atorvastatin at current dose Recheck at f/u in 6 months

## 2018-11-10 NOTE — Patient Instructions (Signed)
Decrease amlodipine to 2.5mg  daily (1/2 of the 5mg  pill) - new prescription for 2.5mg  pill set to the pharmacy also

## 2018-11-10 NOTE — Assessment & Plan Note (Signed)
BP at goal, but with symptoms of orthostatic hypotension and low normal BP Will decrease amlodipine to 2.5mg  daily from 5mg  Continue propranolol at current dose F/u in 6 months or sooner if continues to have issues

## 2018-11-10 NOTE — Progress Notes (Signed)
Patient: Erin Wagner Female    DOB: [birth year]   78 y.o.   MRN: [id number] Visit Date: 11/10/2018  Today's Provider: Lavon Paganini, MD   Chief Complaint  Patient presents with  . Diabetes  . Hypertension   Subjective:    Virtual Visit via Video Note  I connected with Erin Wagner on 11/10/18 at  2:40 PM EDT by a video enabled telemedicine application and verified that I am speaking with the correct person using two identifiers.   Patient location: home Provider location: home office  Persons involved in the visit: patient, provider, patient's granddaughter who also interprets for [place]    I discussed the limitations of evaluation and management by telemedicine and the availability of in person appointments. The patient expressed understanding and agreed to proceed.    HPI  Diabetes Mellitus Type II, Follow-up:              Lab Results  Component Value Date   HGBA1C 6.1 (A) 06/16/2018   Last seen for diabetes34monthsago.  Management since then includescontinue Metformin. Shereports excellentcompliance with treatment. Sheis nothaving side effects. Current symptoms includenone Home blood sugar records:not being checked at home.  Episodes of hypoglycemia?no  Current Insulin Regimen:None Most Recent Eye Exam:10/09/17 Weight trend:stable Prior visit with dietician:no Current diet:in general, a "healthy" diet Current exercise:no regular exercise, just walking some  Pertinent Labs: Lab Results  Component Value Date   CHOL 133 07/14/2018   HDL 46 07/14/2018   LDLCALC 47 07/14/2018   TRIG 200 (H) 07/14/2018   CHOLHDL 2.9 07/14/2018            Wt Readings from Last 3 Encounters:  09/24/17 105 lb 14.4 oz (48 kg)  09/09/17 104 lb 12.8 oz (47.5 kg)  09/03/17 106 lb 4.8 oz (48.2 kg)    ------------------------------------------------------------------------  Hypertension, follow-up:     BP Readings from  Last 3 Encounters:  06/16/18 123/70  06/11/18 118/62  06/10/18 112/67    Shewas last seen for hypertension 5 monthsago.  BP at that visit was123/70. Management since that visit includescontinuing medications. Shereports excellentcompliance with treatment. Sheis nothaving side effects. Sheisadherent to low salt diet.  Outside blood pressures arebeing checked. Sheis experiencing none.  Patient denieschest pain, chest pressure/discomfort, claudication, dyspnea, exertional chest pressure/discomfort, fatigue, irregular heart beat, lower extremity edema, near-syncope, orthopnea, palpitations, paroxysmal nocturnal dyspnea, syncope and tachypnea.  Cardiovascular risk factors includeadvanced age (older than 98 for men, 66 for women), diabetes mellitus, dyslipidemia and hypertension.  Use of agents associated with hypertension:thyroid hormones  She does occasionally have episodes of lightheadedness when standing up from sitting  No Known Allergies   Current Outpatient Medications:  .  amLODipine (NORVASC) 5 MG tablet, Take 1 tablet (5 mg total) by mouth daily., Disp: 90 tablet, Rfl: 3 .  atorvastatin (LIPITOR) 20 MG tablet, Take 1 tablet (20 mg total) by mouth daily., Disp: 90 tablet, Rfl: 3 .  Calcium Carbonate-Vitamin D (CALCIUM 600+D) 600-400 MG-UNIT tablet, Take 1 tablet by mouth daily. , Disp: , Rfl:  .  famotidine (PEPCID) 20 MG tablet, Take 1 tablet (20 mg total) by mouth 2 (two) times daily., Disp: 180 tablet, Rfl: 3 .  levothyroxine (SYNTHROID, LEVOTHROID) 25 MCG tablet, Take 1/2 tablet daily, Disp: 45 tablet, Rfl: 3 .  metFORMIN (GLUCOPHAGE) 1000 MG tablet, Take 0.5 tablets (500 mg total) by mouth 2 (two) times daily with a meal., Disp: 90 tablet, Rfl: 3 .  Multiple Vitamin (MULTIVITAMIN) tablet, Take 1  tablet by mouth daily., Disp: , Rfl:  .  propranolol (INDERAL) 20 MG tablet, Take 1 tablet (20 mg total) by mouth 2 (two) times daily., Disp: 180 tablet, Rfl: 3 .   sertraline (ZOLOFT) 25 MG tablet, Take 1 tablet (25 mg total) by mouth daily., Disp: 90 tablet, Rfl: 3 .  tamoxifen (NOLVADEX) 20 MG tablet, Take 1 tablet (20 mg total) by mouth daily., Disp: 90 tablet, Rfl: 3  Review of Systems  Constitutional: Negative.   Respiratory: Negative.   Cardiovascular: Negative.   Genitourinary: Negative.   Neurological: Positive for light-headedness and headaches. Negative for syncope.  Psychiatric/Behavioral: Negative.     Social History   Tobacco Use  . Smoking status: Never Smoker  . Smokeless tobacco: Never Used  Substance Use Topics  . Alcohol use: No    Frequency: Never      Objective:   BP 120/63 (BP Location: Right Arm, Patient Position: Sitting, Cuff Size: Normal)   Pulse 91  Vitals:   11/10/18 1347  BP: 120/63  Pulse: 91     Physical Exam Constitutional:      Appearance: Normal appearance.  Pulmonary:     Effort: Pulmonary effort is normal. No respiratory distress.  Neurological:     Mental Status: She is alert and oriented to person, place, and time. Mental status is at baseline.  Psychiatric:        Mood and Affect: Mood normal.        Behavior: Behavior normal.     Results for orders placed or performed in visit on 11/10/18  POCT HgB A1C  Result Value Ref Range   Hemoglobin A1C 6.5 (A) 4.0 - 5.6 %   HbA1c POC (<> result, manual entry)     HbA1c, POC (prediabetic range)     HbA1c, POC (controlled diabetic range)         Assessment & Plan    I discussed the assessment and treatment plan with the patient. The patient was provided an opportunity to ask questions and all were answered. The patient agreed with the plan and demonstrated an understanding of the instructions.   The patient was advised to call back or seek an in-person evaluation if the symptoms worsen or if the condition fails to improve as anticipated.  Problem List Items Addressed This Visit      Cardiovascular and Mediastinum   Hypertension    BP at  goal, but with symptoms of orthostatic hypotension and low normal BP Will decrease amlodipine to 2.5mg  daily from 5mg  Continue propranolol at current dose F/u in 6 months or sooner if continues to have issues      Relevant Medications   amLODipine (NORVASC) 2.5 MG tablet     Endocrine   Diabetes mellitus without complication (HCC) - Primary    Stable, chronic, well controlled Discuss goal of A1c <7 Continue metformin 500mg  BID F/u in 66m Declines vaccines      Hyperlipidemia associated with type 2 diabetes mellitus ([place])    Previously well controlled Continue atorvastatin at current dose Recheck at f/u in 6 months          Return in about 6 months (around 05/13/2019) for chronic disease f/u.   The entirety of the information documented in the History of Present Illness, Review of Systems and Physical Exam were personally obtained by me. Portions of this information were initially documented by Tiburcio Pea, CMA and reviewed by me for thoroughness and accuracy.    Virginia Crews, MD,  [facility] 11/10/2018 5:02 PM

## 2018-11-10 NOTE — Assessment & Plan Note (Signed)
Stable, chronic, well controlled Discuss goal of A1c <7 Continue metformin 500mg  BID F/u in 25m Declines vaccines

## 2018-11-12 ENCOUNTER — Telehealth: Payer: Self-pay

## 2018-11-12 NOTE — Telephone Encounter (Signed)
LMTCB about PHQ screening

## 2018-12-09 ENCOUNTER — Telehealth: Payer: Self-pay | Admitting: Pharmacy Technician

## 2018-12-09 NOTE — Telephone Encounter (Signed)
Received 2020 proof of income.  Patient eligible to receive medication assistance at [facility] as long as eligibility requirements continue to be met.  [facility]

## 2018-12-12 ENCOUNTER — Ambulatory Visit: Payer: Self-pay | Admitting: Oncology

## 2018-12-18 ENCOUNTER — Ambulatory Visit: Payer: Self-pay | Admitting: Family Medicine

## 2018-12-25 ENCOUNTER — Ambulatory Visit: Payer: Self-pay | Admitting: Oncology

## 2019-01-05 ENCOUNTER — Ambulatory Visit: Payer: Self-pay | Admitting: Oncology

## 2019-01-19 ENCOUNTER — Inpatient Hospital Stay: Payer: Self-pay | Attending: Oncology | Admitting: Oncology

## 2019-01-19 ENCOUNTER — Other Ambulatory Visit: Payer: Self-pay

## 2019-01-19 ENCOUNTER — Other Ambulatory Visit: Payer: Self-pay | Admitting: *Deleted

## 2019-01-19 ENCOUNTER — Encounter: Payer: Self-pay | Admitting: Oncology

## 2019-01-19 ENCOUNTER — Ambulatory Visit: Payer: Self-pay | Admitting: Oncology

## 2019-01-19 VITALS — BP 134/82 | HR 81 | Temp 98.0°F | Resp 18 | Wt 114.6 lb

## 2019-01-19 DIAGNOSIS — Z5181 Encounter for therapeutic drug level monitoring: Secondary | ICD-10-CM

## 2019-01-19 DIAGNOSIS — C779 Secondary and unspecified malignant neoplasm of lymph node, unspecified: Secondary | ICD-10-CM

## 2019-01-19 DIAGNOSIS — E119 Type 2 diabetes mellitus without complications: Secondary | ICD-10-CM

## 2019-01-19 DIAGNOSIS — I10 Essential (primary) hypertension: Secondary | ICD-10-CM

## 2019-01-19 DIAGNOSIS — I890 Lymphedema, not elsewhere classified: Secondary | ICD-10-CM

## 2019-01-19 DIAGNOSIS — C50912 Malignant neoplasm of unspecified site of left female breast: Secondary | ICD-10-CM

## 2019-01-19 DIAGNOSIS — C50911 Malignant neoplasm of unspecified site of right female breast: Secondary | ICD-10-CM

## 2019-01-19 DIAGNOSIS — Z08 Encounter for follow-up examination after completed treatment for malignant neoplasm: Secondary | ICD-10-CM

## 2019-01-19 DIAGNOSIS — E079 Disorder of thyroid, unspecified: Secondary | ICD-10-CM

## 2019-01-19 DIAGNOSIS — E785 Hyperlipidemia, unspecified: Secondary | ICD-10-CM

## 2019-01-19 DIAGNOSIS — Z79899 Other long term (current) drug therapy: Secondary | ICD-10-CM

## 2019-01-19 DIAGNOSIS — M81 Age-related 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 fracture: Secondary | ICD-10-CM

## 2019-01-19 DIAGNOSIS — Z85819 Personal history of malignant neoplasm of unspecified site of lip, oral cavity, and pharynx: Secondary | ICD-10-CM

## 2019-01-19 DIAGNOSIS — Z79810 Long term (current) use of selective estrogen receptor modulators (SERMs): Secondary | ICD-10-CM

## 2019-01-20 NOTE — Progress Notes (Signed)
Hematology/Oncology Consult note [facility]  [phone] [phone]  Patient Care Team: Virginia Crews, MD as PCP - General (Family Medicine)   Name of the patient: Erin Wagner  [id number]  [birth year]   Date of visit: 01/20/19  Diagnosis- /o b/l breast cancer treated in [place] currently on tamoxifen  Chief complaint/ Reason for visit-routine follow-up of breast cancer on tamoxifen  Heme/Onc history: Patient is a 78 year old [place] speaking female from [place] with a past medical history significant for hypertension hyperlipidemia, diabetes and bilateral breast cancer.She is here with son and daughter in law. She has refused formal language interpretor and would like her family to interpret for her. Her family is comfortable speaking in Hindi with me and translating for her.  Her oncology history is as follows:  1.She was diagnosed with bilateral breast cancer in July 2017 in [place]. She initially received neoadjuvant chemotherapy with 3 cycles of epirubicin and cyclophosphamide. This was followed by bilateral modified radical mastectomy. I do not have the actual pathology report from [place]. 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 I am unable to decipher the written handwriting well. Appears to have 1 out of 13 lymph nodes positive with PNE positive, no LV I or perineural invasion. Left breast mass showed 6 cm mixed infiltrating ductal and lobular histology. Again handwritten notes mention 3 out of 16 lymph nodes positive. In some other page on theoutside report, the right breast cancer has been mentioned as T2 N0 in the left breast cancer has been mentioned as T4N0.  2.Following surgery she had 1 cycle of epirubicin and cyclophosphamide and 2 cycles of doxorubicin. She then had IMRT to her bilateral  breasts over 5 weeks. She was then asked to take tamoxifen for 6 months only. There is no mention about ER PR and HER-2 status anywhere in the outside reports. Patient was asked to take tamoxifen for 6 months following radiation and then stop it.   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 Since definitive evidence of ER status was not obtainable,given history of bilateral node positive breast cancer and was to proceed with tamoxifen assuming that it was ER PR positive. Tamoxifen 20 mg started July 2019. Fosamax weekly started for osteoporosis. She also had CT chest abdomen and pelvis as well as bone scan which did not reveal any evidence of malignancy. Focally increased activity in the left second rib to the presence of subacute fracture.  Interval history-patient can understand hindi and I communicated with the patient and family.  Patient son was also over the phone during the visit.  ECOG PS- 1 Pain scale- 0 Opioid associated constipation- no  Review of systems- Review of Systems  Constitutional: Negative for chills, fever, malaise/fatigue and weight loss.  HENT: Negative for congestion, ear discharge and nosebleeds.   Eyes: Negative for blurred vision.  Respiratory: Negative for cough, hemoptysis, sputum production, shortness of breath and wheezing.   Cardiovascular: Negative for chest pain, palpitations, orthopnea and claudication.  Gastrointestinal: Negative for abdominal pain, blood in stool, constipation, diarrhea, heartburn, melena, nausea and vomiting.  Genitourinary: Negative for dysuria, flank pain, frequency, hematuria and urgency.  Musculoskeletal: Negative for back  pain, joint pain and myalgias.       Left arm pain and swelling  Skin:  Negative for rash.  Neurological: Negative for dizziness, tingling, focal weakness, seizures, weakness and headaches.  Endo/Heme/Allergies: Does not bruise/bleed easily.  Psychiatric/Behavioral: Negative for depression and suicidal ideas. The patient does not have insomnia.    No Known Allergies   Past Medical History:  Diagnosis Date  . Cancer ([place])    breast  . Cataract   . Diabetes mellitus without complication ([place])   . Hyperlipidemia   . Hypertension   . Thyroid disease      Past Surgical History:  Procedure Laterality Date  . BREAST SURGERY    . EYE SURGERY      Social History   Socioeconomic History  . Marital status: Widowed    Spouse name: Not on file  . Number of children: 4  . Years of education: Not on file  . Highest education level: Not on file  Occupational History    Employer: retired    Comment: homemaker  Social Needs  . Financial resource strain: Not on file  . Food insecurity    Worry: Not on file    Inability: Not on file  . Transportation needs    Medical: Not on file    Non-medical: Not on file  Tobacco Use  . Smoking status: Never Smoker  . Smokeless tobacco: Never Used  Substance and Sexual Activity  . Alcohol use: No    Frequency: Never  . Drug use: No  . Sexual activity: Not on file  Lifestyle  . Physical activity    Days per week: 3 days    Minutes per session: 20 min  . Stress: Not on file  Relationships  . Social Herbalist on phone: Not on file    Gets together: Not on file    Attends religious service: Not on file    Active member of club or organization: Not on file    Attends meetings of clubs or organizations: Not on file    Relationship status: Not on file  . Intimate partner violence    Fear of current or ex partner: Not on file    Emotionally abused: Not on file    Physically abused: Not on file    Forced sexual activity: Not on file  Other Topics Concern  . Not on file  Social History Narrative   . Not on file    Family History  Problem Relation Age of Onset  . Healthy Sister   . Healthy Brother   . Healthy Daughter   . Healthy Sister   . Healthy Daughter   . Healthy Daughter      Current Outpatient Medications:  .  amLODipine (NORVASC) 2.5 MG tablet, Take 1 tablet (2.5 mg total) by mouth daily., Disp: 90 tablet, Rfl: 1 .  atorvastatin (LIPITOR) 20 MG tablet, Take 1 tablet (20 mg total) by mouth daily., Disp: 90 tablet, Rfl: 3 .  Calcium Carbonate-Vitamin D (CALCIUM 600+D) 600-400 MG-UNIT tablet, Take 1 tablet by mouth daily. , Disp: , Rfl:  .  famotidine (PEPCID) 20 MG tablet, Take 1 tablet (20 mg total) by mouth 2 (two) times daily., Disp: 180 tablet, Rfl: 3 .  levothyroxine (SYNTHROID, LEVOTHROID) 25 MCG tablet, Take 1/2 tablet daily, Disp: 45 tablet, Rfl: 3 .  metFORMIN (GLUCOPHAGE) 1000 MG tablet, Take 0.5 tablets (500 mg total) by mouth 2 (two) times daily with a meal., Disp: 90  tablet, Rfl: 3 .  Multiple Vitamin (MULTIVITAMIN) tablet, Take 1 tablet by mouth daily., Disp: , Rfl:  .  propranolol (INDERAL) 20 MG tablet, Take 1 tablet (20 mg total) by mouth 2 (two) times daily., Disp: 180 tablet, Rfl: 3 .  sertraline (ZOLOFT) 25 MG tablet, Take 1 tablet (25 mg total) by mouth daily., Disp: 90 tablet, Rfl: 3 .  tamoxifen (NOLVADEX) 20 MG tablet, Take 1 tablet (20 mg total) by mouth daily., Disp: 90 tablet, Rfl: 3  Physical exam:  Vitals:   01/19/19 1452  BP: 134/82  Pulse: 81  Resp: 18  Temp: 98 F (36.7 C)  Weight: 114 lb 9.6 oz (52 kg)   Physical Exam HENT:     Head: Normocephalic and atraumatic.  Eyes:     Pupils: Pupils are equal, round, and reactive to light.  Neck:     Musculoskeletal: Normal range of motion.  Cardiovascular:     Rate and Rhythm: Normal rate and regular rhythm.     Heart sounds: Normal heart sounds.  Pulmonary:     Effort: Pulmonary effort is normal.     Breath sounds: Normal breath sounds.  Abdominal:     General: Bowel sounds are  normal.     Palpations: Abdomen is soft.  Musculoskeletal:     Comments: Left arm swelling chronic secondary to lymphedema  Skin:    General: Skin is warm and dry.  Neurological:     Mental Status: She is alert and oriented to person, place, and time.    Patient is status post bilateral mastectomy without reconstruction.  No evidence of chest wall recurrence.  No evidence of palpable bilateral axillary adenopathy.    CMP Latest Ref Rng & Units 07/14/2018  Glucose 65 - 99 mg/dL 119(H)  BUN 8 - 27 mg/dL 12  Creatinine 0.57 - 1.00 mg/dL 0.98  Sodium 134 - 144 mmol/L 134  Potassium 3.5 - 5.2 mmol/L 5.4(H)  Chloride 96 - 106 mmol/L 97  CO2 20 - 29 mmol/L 22  Calcium 8.7 - 10.3 mg/dL 9.9  Total Protein 6.0 - 8.5 g/dL 6.8  Total Bilirubin 0.0 - 1.2 mg/dL 0.3  Alkaline Phos 39 - 117 IU/L 38(L)  AST 0 - 40 IU/L 22  ALT 0 - 32 IU/L 15   CBC Latest Ref Rng & Units 01/24/2018  WBC 3.6 - 11.0 K/uL 9.9  Hemoglobin 12.0 - 16.0 g/dL 11.8(L)  Hematocrit 35.0 - 47.0 % 34.5(L)  Platelets 150 - 440 K/uL 296      No results found.   Assessment and plan- Patient is a 78 y.o. female with bilateral breast cancer (stage is unclear and ER PR and her 2 neu status is also unclear) s/p neoadjuvant chemotherapy followed bilateral mastectomy without reconstruction, adjuvant chemotherapy, RT and 6 months of tamoxifen in 2017.Tamoxifen restarted in July 2019.  She is here for routine follow-up of breast cancer  Clinically patient is doing well and there are no concerns of recurrence on today's exam.  She will continue to take tamoxifen at least for 5 years if not 10 years.  Patient does have osteoporosis which is disease and tamoxifen was chosen over AI.  She is unable to 1 bisphosphonates due to her creatinine clearance being between 33-35.  Plan is to repeat bone density scan next year.  I will refer her to Occupational Therapy given her left arm swelling which has been bothering the patient and I  suspect this is from lymphedema following mastectomy.  Visit Diagnosis 1. Lymphedema   2. Encounter for follow-up surveillance of breast cancer   3. Encounter for monitoring tamoxifen therapy      Dr. Randa Evens, MD, MPH [facility] [phone] 01/20/2019 9:05 AM

## 2019-02-03 ENCOUNTER — Ambulatory Visit: Payer: Self-pay | Admitting: Physical Therapy

## 2019-05-13 ENCOUNTER — Other Ambulatory Visit: Payer: Self-pay

## 2019-05-13 ENCOUNTER — Ambulatory Visit (INDEPENDENT_AMBULATORY_CARE_PROVIDER_SITE_OTHER): Payer: Self-pay | Admitting: Family Medicine

## 2019-05-13 ENCOUNTER — Encounter: Payer: Self-pay | Admitting: Family Medicine

## 2019-05-13 VITALS — BP 123/72 | HR 81 | Temp 96.9°F | Wt 114.0 lb

## 2019-05-13 DIAGNOSIS — E1169 Type 2 diabetes mellitus with other specified complication: Secondary | ICD-10-CM

## 2019-05-13 DIAGNOSIS — K219 Gastro-esophageal reflux disease without esophagitis: Secondary | ICD-10-CM

## 2019-05-13 DIAGNOSIS — F341 Dysthymic disorder: Secondary | ICD-10-CM

## 2019-05-13 DIAGNOSIS — E039 Hypothyroidism, unspecified: Secondary | ICD-10-CM | POA: Insufficient documentation

## 2019-05-13 DIAGNOSIS — H1013 Acute atopic conjunctivitis, bilateral: Secondary | ICD-10-CM

## 2019-05-13 DIAGNOSIS — G250 Essential tremor: Secondary | ICD-10-CM | POA: Insufficient documentation

## 2019-05-13 DIAGNOSIS — I10 Essential (primary) hypertension: Secondary | ICD-10-CM

## 2019-05-13 DIAGNOSIS — E785 Hyperlipidemia, unspecified: Secondary | ICD-10-CM

## 2019-05-13 DIAGNOSIS — E119 Type 2 diabetes mellitus without complications: Secondary | ICD-10-CM

## 2019-05-13 MED ORDER — OMEPRAZOLE 20 MG PO CPDR: 20.0000 mg | DELAYED_RELEASE_CAPSULE | Freq: Every day | ORAL | 0 refills | Status: DC

## 2019-05-13 NOTE — Assessment & Plan Note (Signed)
Previously well controlled Continue atorvastatin Recheck in 6 months

## 2019-05-13 NOTE — Assessment & Plan Note (Signed)
Continue propranolol May need to decrease dose if continues to have orthostatic hypotension

## 2019-05-13 NOTE — Progress Notes (Signed)
Patient: Erin Wagner Female    DOB: [birth year]   78 y.o.   MRN: [id number] Visit Date: 05/13/2019  Today's Provider: Lavon Paganini, MD   Chief Complaint  Patient presents with  . Hypertension  . Hyperlipidemia  . Diabetes   Subjective:     Gastroesophageal Reflux She complains of coughing (Started about two days ago). She reports no abdominal pain, no choking, no nausea or no wheezing. This is a new problem. The current episode started more than 1 month ago. The problem occurs frequently. The problem has been gradually worsening. The symptoms are aggravated by certain foods.     Diabetes Mellitus Type II, Follow-up:   Lab Results  Component Value Date   HGBA1C 6.5 (A) 11/10/2018   HGBA1C 6.1 (A) 06/16/2018   HGBA1C 5.8 (A) 12/13/2017   Last seen for diabetes 6 months ago.  Management since then includes No changes. She reports excellent compliance with treatment. She is not having side effects.  Current symptoms include none and have been stable. Home blood sugar records: Pt does not check her blood sugar.  Episodes of hypoglycemia? no   Current Insulin Regimen: None Most Recent Eye Exam: Pt is due for an eye exam. Weight trend: stable Prior visit with dietician: no Current diet: in general, a "healthy" diet   Current exercise: none  ------------------------------------------------------------------------   Hypertension, follow-up:  BP Readings from Last 3 Encounters:  05/13/19 123/72  01/19/19 134/82  11/10/18 120/63    She was last seen for hypertension 6 months ago.  BP at that visit was 120/63. Management since that visit includes Decreased Amlodipine to 2.5mg  a day secondary to symptoms of orthostatic hypotension She reports excellent compliance with treatment. She is not having side effects.  She is not exercising. She is adherent to low salt diet.   Outside blood pressures are normal. She is experiencing dizziness.  Patient  denies chest pain, dyspnea, exertional chest pressure/discomfort, irregular heart beat and lower extremity edema.   Cardiovascular risk factors include advanced age (older than 40 for men, 55 for women), diabetes mellitus and hypertension.  Use of agents associated with hypertension: none.   ------------------------------------------------------------------------ Dizziness while laying down or sitting up  Itching of eyes, some seasonal cough   Lipid/Cholesterol, Follow-up:   Last seen for this 6 months ago.  Management since that visit includes No changes.  Last Lipid Panel:    Component Value Date/Time   CHOL 133 07/14/2018 0852   TRIG 200 (H) 07/14/2018 0852   HDL 46 07/14/2018 0852   CHOLHDL 2.9 07/14/2018 0852   LDLCALC 47 07/14/2018 0852    She reports excellent compliance with treatment. She is not having side effects.   Wt Readings from Last 3 Encounters:  05/13/19 114 lb (51.7 kg)  01/19/19 114 lb 9.6 oz (52 kg)  06/16/18 108 lb 12.8 oz (49.4 kg)    ------------------------------------------------------------------------   No Known Allergies   Current Outpatient Medications:  .  atorvastatin (LIPITOR) 20 MG tablet, Take 1 tablet (20 mg total) by mouth daily., Disp: 90 tablet, Rfl: 3 .  Calcium Carbonate-Vitamin D (CALCIUM 600+D) 600-400 MG-UNIT tablet, Take 1 tablet by mouth daily. , Disp: , Rfl:  .  famotidine (PEPCID) 20 MG tablet, Take 1 tablet (20 mg total) by mouth 2 (two) times daily., Disp: 180 tablet, Rfl: 3 .  levothyroxine (SYNTHROID, LEVOTHROID) 25 MCG tablet, Take 1/2 tablet daily, Disp: 45 tablet, Rfl: 3 .  metFORMIN (GLUCOPHAGE) 1000 MG  tablet, Take 0.5 tablets (500 mg total) by mouth 2 (two) times daily with a meal., Disp: 90 tablet, Rfl: 3 .  Multiple Vitamin (MULTIVITAMIN) tablet, Take 1 tablet by mouth daily., Disp: , Rfl:  .  propranolol (INDERAL) 20 MG tablet, Take 1 tablet (20 mg total) by mouth 2 (two) times daily., Disp: 180 tablet, Rfl: 3  .  sertraline (ZOLOFT) 25 MG tablet, Take 1 tablet (25 mg total) by mouth daily., Disp: 90 tablet, Rfl: 3 .  tamoxifen (NOLVADEX) 20 MG tablet, Take 1 tablet (20 mg total) by mouth daily., Disp: 90 tablet, Rfl: 3 .  omeprazole (PRILOSEC) 20 MG capsule, Take 1 capsule (20 mg total) by mouth daily., Disp: 30 capsule, Rfl: 0  Review of Systems  Constitutional: Negative.   Eyes: Positive for itching (Has been going on for 3-4 months. ). Negative for photophobia, pain, discharge, redness and visual disturbance.  Respiratory: Positive for cough (Started about two days ago). Negative for apnea, choking, chest tightness, shortness of breath, wheezing and stridor.   Cardiovascular: Negative.   Gastrointestinal: Negative for abdominal distention, abdominal pain, anal bleeding, blood in stool, constipation, diarrhea, nausea, rectal pain and vomiting.  Musculoskeletal: Negative.   Neurological: Positive for dizziness. Negative for light-headedness and headaches.    Social History   Tobacco Use  . Smoking status: Never Smoker  . Smokeless tobacco: Never Used  Substance Use Topics  . Alcohol use: No    Frequency: Never      Objective:   BP 123/72 (BP Location: Right Arm, Patient Position: Sitting, Cuff Size: Normal)   Pulse 81   Temp (!) 96.9 F (36.1 C) (Temporal)   Wt 114 lb (51.7 kg)   BMI 27.49 kg/m  Vitals:   05/13/19 1509  BP: 123/72  Pulse: 81  Temp: (!) 96.9 F (36.1 C)  TempSrc: Temporal  Weight: 114 lb (51.7 kg)  Body mass index is 27.49 kg/m.   Physical Exam Vitals signs reviewed.  Constitutional:      General: She is not in acute distress.    Appearance: Normal appearance. She is well-developed. She is not diaphoretic.  HENT:     Head: Normocephalic and atraumatic.  Eyes:     General: No scleral icterus.    Extraocular Movements: Extraocular movements intact.     Conjunctiva/sclera: Conjunctivae normal.     Pupils: Pupils are equal, round, and reactive to light.   Neck:     Musculoskeletal: Neck supple.     Thyroid: No thyromegaly.  Cardiovascular:     Rate and Rhythm: Normal rate and regular rhythm.     Pulses: Normal pulses.     Heart sounds: Normal heart sounds. No murmur.  Pulmonary:     Effort: Pulmonary effort is normal. No respiratory distress.     Breath sounds: Normal breath sounds. No wheezing, rhonchi or rales.  Musculoskeletal:     Right lower leg: No edema.     Left lower leg: No edema.  Lymphadenopathy:     Cervical: No cervical adenopathy.  Skin:    General: Skin is warm and dry.     Capillary Refill: Capillary refill takes less than 2 seconds.     Findings: No rash.  Neurological:     General: No focal deficit present.     Mental Status: She is alert and oriented to person, place, and time. Mental status is at baseline.     Cranial Nerves: No cranial nerve deficit.     Gait: Gait normal.  Psychiatric:        Mood and Affect: Mood normal.        Behavior: Behavior normal.    Diabetic Foot Exam - Simple   Simple Foot Form Diabetic Foot exam was performed with the following findings: Yes 05/13/2019  3:47 PM  Visual Inspection No deformities, no ulcerations, no other skin breakdown bilaterally: Yes Sensation Testing Intact to touch and monofilament testing bilaterally: Yes Pulse Check Posterior Tibialis and Dorsalis pulse intact bilaterally: Yes Comments      No results found for any visits on 05/13/19.     Assessment & Plan   Problem List Items Addressed This Visit      Cardiovascular and Mediastinum   Hypertension - Primary    BP at goal, but with symptoms of orthostatic hypotension and low-normal BP D/c amlodipine Continue propranolol at current dose for essential tremor F/u in 6 months or sooner if dizziness persists Take caution with position changes Recheck BMP      Relevant Orders   Basic Metabolic Panel (BMET)     Digestive   GERD (gastroesophageal reflux disease)    Uncontrolled Trial of  PPI Minimize time of use given osteoporosis and advanced age      Relevant Medications   omeprazole (PRILOSEC) 20 MG capsule     Endocrine   Diabetes mellitus without complication (HCC)    Previously well controlled Recheck A1c Continue metformin Foot exam today Microalbumin, eye exam cost prohibitive Advised flu and pneumovax at [facility] F/u in 6 months      Relevant Orders   Hemoglobin A1c   Hyperlipidemia associated with type 2 diabetes mellitus ([place])    Previously well controlled Continue atorvastatin Recheck in 6 months      Hypothyroidism    Continue synthroid Previously well controlled Recheck TSH      Relevant Orders   TSH     Nervous and Auditory   Essential tremor    Continue propranolol May need to decrease dose if continues to have orthostatic hypotension        Other   Depression    Well controlled Continue low dose Zoloft      Allergic conjunctivitis of both eyes    Trial of pataday and OTC antihistamine          Return in about 6 months (around 11/10/2019) for chronic disease f/u.   The entirety of the information documented in the History of Present Illness, Review of Systems and Physical Exam were personally obtained by me. Portions of this information were initially documented by Ashley Royalty, CMA and reviewed by me for thoroughness and accuracy.    Miliani Deike, Dionne Bucy, MD MPH [facility]

## 2019-05-13 NOTE — Assessment & Plan Note (Signed)
Continue synthroid Previously well controlled Recheck TSH

## 2019-05-13 NOTE — Assessment & Plan Note (Signed)
Trial of pataday and OTC antihistamine

## 2019-05-13 NOTE — Patient Instructions (Addendum)
Stop amlodipine to help with dizziness Continue propranolol  Can try Pataday (generic is fine) eye drops for itching eyes Can try Cetirizine for cough with change of season  Try 1 month of Omeprazole daily Then switch back to Pepcid (famotidine) 20mg  twice daily

## 2019-05-13 NOTE — Assessment & Plan Note (Signed)
Uncontrolled Trial of PPI Minimize time of use given osteoporosis and advanced age

## 2019-05-13 NOTE — Assessment & Plan Note (Signed)
Well controlled Continue low dose Zoloft

## 2019-05-13 NOTE — Assessment & Plan Note (Signed)
BP at goal, but with symptoms of orthostatic hypotension and low-normal BP D/c amlodipine Continue propranolol at current dose for essential tremor F/u in 6 months or sooner if dizziness persists Take caution with position changes Recheck BMP

## 2019-05-13 NOTE — Assessment & Plan Note (Addendum)
Previously well controlled Recheck A1c Continue metformin Foot exam today Microalbumin, eye exam cost prohibitive Advised flu and pneumovax at [facility] F/u in 6 months

## 2019-05-14 LAB — BASIC METABOLIC PANEL
BUN/Creatinine Ratio: 13 (ref 12–28)
BUN: 13 mg/dL (ref 8–27)
CO2: 21 mmol/L (ref 20–29)
Calcium: 9.8 mg/dL (ref 8.7–10.3)
Chloride: 97 mmol/L (ref 96–106)
Creatinine, Ser: 1.04 mg/dL — ABNORMAL HIGH (ref 0.57–1.00)
GFR calc Af Amer: 59 mL/min/{1.73_m2} — ABNORMAL LOW (ref >59)
GFR calc non Af Amer: 52 mL/min/{1.73_m2} — ABNORMAL LOW (ref >59)
Glucose: 110 mg/dL — ABNORMAL HIGH (ref 65–99)
Potassium: 4.5 mmol/L (ref 3.5–5.2)
Sodium: 135 mmol/L (ref 134–144)

## 2019-05-14 LAB — HEMOGLOBIN A1C
Est. average glucose Bld gHb Est-mCnc: 148 mg/dL
Hgb A1c MFr Bld: 6.8 % — ABNORMAL HIGH (ref 4.8–5.6)

## 2019-05-14 LAB — TSH: TSH: 2.99 u[IU]/mL (ref 0.450–4.500)

## 2019-05-15 ENCOUNTER — Telehealth: Payer: Self-pay

## 2019-05-15 NOTE — Telephone Encounter (Signed)
Patient's daughter Cleaster Corin advised as below.

## 2019-05-15 NOTE — Telephone Encounter (Signed)
-----   Message from Virginia Crews, MD sent at 05/15/2019  8:47 AM EST ----- Stable kidney function.  Normal thyroid function.  A1c has increased slightly, but remains controlled.  Work on cutting back on carbohydrates in diet.

## 2019-05-15 NOTE — Telephone Encounter (Signed)
Patients daughter Cleaster Corin had called office back to get patients lab results, she asks that you contact her at [phone] because patient can not get to phone to answer. KW

## 2019-05-15 NOTE — Telephone Encounter (Signed)
LMTCB 05/15/2019  Thanks,   -Mickel Baas

## 2019-06-12 ENCOUNTER — Other Ambulatory Visit: Payer: Self-pay | Admitting: *Deleted

## 2019-06-12 MED ORDER — TAMOXIFEN CITRATE 20 MG PO TABS: 20.0000 mg | ORAL_TABLET | Freq: Every day | ORAL | 3 refills | Status: DC

## 2019-07-22 NOTE — Progress Notes (Signed)
Left message to cal back 

## 2019-07-23 ENCOUNTER — Inpatient Hospital Stay: Payer: Self-pay | Attending: Oncology | Admitting: Oncology

## 2019-07-23 ENCOUNTER — Other Ambulatory Visit: Payer: Self-pay

## 2019-07-23 ENCOUNTER — Encounter: Payer: Self-pay | Admitting: Oncology

## 2019-07-23 VITALS — BP 144/59 | HR 87 | Temp 98.1°F | Ht <= 58 in | Wt 114.6 lb

## 2019-07-23 DIAGNOSIS — C50912 Malignant neoplasm of unspecified site of left female breast: Secondary | ICD-10-CM | POA: Insufficient documentation

## 2019-07-23 DIAGNOSIS — Z5181 Encounter for therapeutic drug level monitoring: Secondary | ICD-10-CM

## 2019-07-23 DIAGNOSIS — Z79899 Other long term (current) drug therapy: Secondary | ICD-10-CM | POA: Insufficient documentation

## 2019-07-23 DIAGNOSIS — C50911 Malignant neoplasm of unspecified site of right female breast: Secondary | ICD-10-CM | POA: Insufficient documentation

## 2019-07-23 DIAGNOSIS — E1136 Type 2 diabetes mellitus with diabetic cataract: Secondary | ICD-10-CM | POA: Insufficient documentation

## 2019-07-23 DIAGNOSIS — Z7984 Long term (current) use of oral hypoglycemic drugs: Secondary | ICD-10-CM | POA: Insufficient documentation

## 2019-07-23 DIAGNOSIS — I890 Lymphedema, not elsewhere classified: Secondary | ICD-10-CM | POA: Insufficient documentation

## 2019-07-23 DIAGNOSIS — M79602 Pain in left arm: Secondary | ICD-10-CM | POA: Insufficient documentation

## 2019-07-23 DIAGNOSIS — Z170 Estrogen receptor positive status [ER+]: Secondary | ICD-10-CM | POA: Insufficient documentation

## 2019-07-23 DIAGNOSIS — M79601 Pain in right arm: Secondary | ICD-10-CM | POA: Insufficient documentation

## 2019-07-23 DIAGNOSIS — E785 Hyperlipidemia, unspecified: Secondary | ICD-10-CM | POA: Insufficient documentation

## 2019-07-23 DIAGNOSIS — Z79810 Long term (current) use of selective estrogen receptor modulators (SERMs): Secondary | ICD-10-CM | POA: Insufficient documentation

## 2019-07-23 DIAGNOSIS — E079 Disorder of thyroid, unspecified: Secondary | ICD-10-CM | POA: Insufficient documentation

## 2019-07-23 DIAGNOSIS — Z08 Encounter for follow-up examination after completed treatment for malignant neoplasm: Secondary | ICD-10-CM

## 2019-07-23 DIAGNOSIS — I10 Essential (primary) hypertension: Secondary | ICD-10-CM | POA: Insufficient documentation

## 2019-07-23 DIAGNOSIS — Z853 Personal history of malignant neoplasm of breast: Secondary | ICD-10-CM

## 2019-07-23 DIAGNOSIS — Z9013 Acquired absence of bilateral breasts and nipples: Secondary | ICD-10-CM | POA: Insufficient documentation

## 2019-07-23 NOTE — Progress Notes (Signed)
Patient's son-Rajendrakumar stated that she had been doing well with no complaints. Patient had a Bone Scan on 02/17/2018.

## 2019-07-26 NOTE — Progress Notes (Signed)
Hematology/Oncology Consult note [facility]  [phone] [phone]  Patient Care Team: Virginia Crews, MD as PCP - General (Family Medicine)   Name of the patient: Erin Wagner  [id number]  01-07-1941   Date of visit: 07/26/19  Diagnosis- h/o b/l breast cancer in [place] currently on tamoxifen  Chief complaint/ Reason for visit- routine f/u of breast cancer on tamoxifen  Heme/Onc history: Patient is a 79 year old [place] speaking female from [place] with a past medical history significant for hypertension hyperlipidemia, diabetes and bilateral breast cancer.She is here with son and daughter in law. She has refused formal language interpretor and would like her family to interpret for her. Her family is comfortable speaking in Hindi with me and translating for her.  Her oncology history is as follows:  1.She was diagnosed with bilateral breast cancer in July 2017 in [place]. She initially received neoadjuvant chemotherapy with 3 cycles of epirubicin and cyclophosphamide. This was followed by bilateral modified radical mastectomy. I do not have the actual pathology report from [place]. 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 I am unable to decipher the written handwriting well. Appears to have 1 out of 13 lymph nodes positive with PNE positive, no LV I or perineural invasion. Left breast mass showed 6 cm mixed infiltrating ductal and lobular histology. Again handwritten notes mention 3 out of 16 lymph nodes positive. In some other page on theoutside report, the right breast cancer has been mentioned as T2 N0 in the left breast cancer has been mentioned as T4N0.  2.Following surgery she had 1 cycle of epirubicin and cyclophosphamide and 2 cycles of doxorubicin. She then had IMRT to her bilateral breasts over 5  weeks. She was then asked to take tamoxifen for 6 months only. There is no mention about ER PR and HER-2 status anywhere in the outside reports. Patient was asked to take tamoxifen for 6 months following radiation and then stop it.   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 Since definitive evidence of ER status was not obtainable,given history of bilateral node positive breast cancer and was to proceed with tamoxifen assuming that it was ER PR positive. Tamoxifen 20 mg started July 2019. Fosamax weekly started for osteoporosis. She also had CT chest abdomen and pelvis as well as bone scan which did not reveal any evidence of malignancy. Focally increased activity in the left second rib to the presence of subacute fracture.   Interval history- Patient continues to have intermittent pain in her b/l arms from prior mastectomy. She is tolerating tamoxifen well without any significant side effects  ECOG PS- 1 Pain scale- 0  Review of systems- Review of Systems  Constitutional: Positive for malaise/fatigue. Negative for chills, fever and weight loss.  HENT: Negative for congestion, ear discharge and nosebleeds.   Eyes: Negative for blurred vision.  Respiratory: Negative for cough, hemoptysis, sputum production, shortness of breath and wheezing.   Cardiovascular: Negative for chest pain, palpitations, orthopnea and claudication.  Gastrointestinal: Negative for abdominal pain, blood in stool, constipation, diarrhea, heartburn, melena, nausea and vomiting.  Genitourinary: Negative for dysuria, flank pain, frequency, hematuria and urgency.  Musculoskeletal: Negative for back  pain, joint pain and myalgias.       B/l arm pain  Skin: Negative for rash.  Neurological:  Negative for dizziness, tingling, focal weakness, seizures, weakness and headaches.  Endo/Heme/Allergies: Does not bruise/bleed easily.  Psychiatric/Behavioral: Negative for depression and suicidal ideas. The patient does not have insomnia.        No Known Allergies   Past Medical History:  Diagnosis Date  . Cancer ([place])    breast  . Cataract   . Diabetes mellitus without complication ([place])   . Hyperlipidemia   . Hypertension   . Thyroid disease      Past Surgical History:  Procedure Laterality Date  . BREAST SURGERY    . EYE SURGERY      Social History   Socioeconomic History  . Marital status: Widowed    Spouse name: Not on file  . Number of children: 4  . Years of education: Not on file  . Highest education level: Not on file  Occupational History    Employer: retired    Comment: homemaker  Tobacco Use  . Smoking status: Never Smoker  . Smokeless tobacco: Never Used  Substance and Sexual Activity  . Alcohol use: No  . Drug use: No  . Sexual activity: Not on file  Other Topics Concern  . Not on file  Social History Narrative  . Not on file   Social Determinants of Health   Financial Resource Strain:   . Difficulty of Paying Living Expenses: Not on file  Food Insecurity:   . Worried About Charity fundraiser in the Last Year: Not on file  . Ran Out of Food in the Last Year: Not on file  Transportation Needs:   . Lack of Transportation (Medical): Not on file  . Lack of Transportation (Non-Medical): Not on file  Physical Activity:   . Days of Exercise per Week: Not on file  . Minutes of Exercise per Session: Not on file  Stress:   . Feeling of Stress : Not on file  Social Connections:   . Frequency of Communication with Friends and Family: Not on file  . Frequency of Social Gatherings with Friends and Family: Not on file  . Attends Religious Services: Not on file  . Active Member of Clubs or Organizations: Not on file  . Attends Theatre manager Meetings: Not on file  . Marital Status: Not on file  Intimate Partner Violence:   . Fear of Current or Ex-Partner: Not on file  . Emotionally Abused: Not on file  . Physically Abused: Not on file  . Sexually Abused: Not on file    Family History  Problem Relation Age of Onset  . Healthy Sister   . Healthy Brother   . Healthy Daughter   . Healthy Sister   . Healthy Daughter   . Healthy Daughter      Current Outpatient Medications:  .  atorvastatin (LIPITOR) 20 MG tablet, Take 1 tablet (20 mg total) by mouth daily., Disp: 90 tablet, Rfl: 3 .  Calcium Carbonate-Vitamin D (CALCIUM 600+D) 600-400 MG-UNIT tablet, Take 1 tablet by mouth daily. , Disp: , Rfl:  .  famotidine (PEPCID) 20 MG tablet, Take 1 tablet (20 mg total) by mouth 2 (two) times daily., Disp: 180 tablet, Rfl: 3 .  levothyroxine (SYNTHROID, LEVOTHROID) 25 MCG tablet, Take 1/2 tablet daily, Disp: 45 tablet, Rfl: 3 .  metFORMIN (GLUCOPHAGE) 1000 MG tablet, Take 0.5 tablets (500 mg total) by mouth 2 (  two) times daily with a meal., Disp: 90 tablet, Rfl: 3 .  Multiple Vitamin (MULTIVITAMIN) tablet, Take 1 tablet by mouth daily., Disp: , Rfl:  .  omeprazole (PRILOSEC) 20 MG capsule, Take 1 capsule (20 mg total) by mouth daily., Disp: 30 capsule, Rfl: 0 .  propranolol (INDERAL) 20 MG tablet, Take 1 tablet (20 mg total) by mouth 2 (two) times daily., Disp: 180 tablet, Rfl: 3 .  sertraline (ZOLOFT) 25 MG tablet, Take 1 tablet (25 mg total) by mouth daily., Disp: 90 tablet, Rfl: 3 .  tamoxifen (NOLVADEX) 20 MG tablet, Take 1 tablet (20 mg total) by mouth daily., Disp: 90 tablet, Rfl: 3  Physical exam:  Vitals:   07/23/19 1442  BP: (!) 144/59  Pulse: 87  Temp: 98.1 F (36.7 C)  TempSrc: Tympanic  Weight: 114 lb 9.6 oz (52 kg)  Height: _0  (1.372 m)   Physical Exam Constitutional:      General: She is not in acute distress. HENT:     Head: Normocephalic and atraumatic.  Eyes:     Pupils: Pupils are equal,  round, and reactive to light.  Cardiovascular:     Rate and Rhythm: Normal rate and regular rhythm.     Heart sounds: Normal heart sounds.  Pulmonary:     Effort: Pulmonary effort is normal.     Breath sounds: Normal breath sounds.  Abdominal:     General: Bowel sounds are normal.     Palpations: Abdomen is soft.  Musculoskeletal:     Cervical back: Normal range of motion.     Comments: Mild b/l UE swelling noted  Skin:    General: Skin is warm and dry.  Neurological:     Mental Status: She is alert and oriented to person, place, and time.     Breast exam is performed in seated and lying down position. Patient is status post bilateral mastectomy without reconstruction. No evidence of any chest wall recurrence. No evidence of bilateral axillary adenopathy   CMP Latest Ref Rng & Units 05/13/2019  Glucose 65 - 99 mg/dL 110(H)  BUN 8 - 27 mg/dL 13  Creatinine 0.57 - 1.00 mg/dL 1.04(H)  Sodium 134 - 144 mmol/L 135  Potassium 3.5 - 5.2 mmol/L 4.5  Chloride 96 - 106 mmol/L 97  CO2 20 - 29 mmol/L 21  Calcium 8.7 - 10.3 mg/dL 9.8  Total Protein 6.0 - 8.5 g/dL -  Total Bilirubin 0.0 - 1.2 mg/dL -  Alkaline Phos 39 - 117 IU/L -  AST 0 - 40 IU/L -  ALT 0 - 32 IU/L -   CBC Latest Ref Rng & Units 01/24/2018  WBC 3.6 - 11.0 K/uL 9.9  Hemoglobin 12.0 - 16.0 g/dL 11.8(L)  Hematocrit 35.0 - 47.0 % 34.5(L)  Platelets 150 - 440 K/uL 296      Assessment and plan- Patient is a 79 y.o. female with bilateral breast cancer (stage is unclear and ER PR and her 2 neu status is also unclear) s/p neoadjuvant chemotherapy followed bilateral mastectomy without reconstruction, adjuvant chemotherapy, RT and 6 months of tamoxifenin 2017.Tamoxifen restarted in July 2019. This is a routine f/u visit.   Clinically doing well and concerning signs and symptoms of recurrence based on todays exam. Tolerating tamoxifen and ca/ vit D well. She has baseline osteoporosis which we will continue to monitor.    Encourgaed patient to get in touch with OT for lymphedema management which I think is the cause of her arm pain  Visit Diagnosis 1. Encounter for monitoring tamoxifen therapy   2. Encounter for follow-up surveillance of breast cancer      Dr. Randa Evens, MD, MPH [facility] [phone] 07/26/2019 12:55 PM

## 2019-07-28 ENCOUNTER — Encounter: Payer: Self-pay | Admitting: Family Medicine

## 2019-07-28 ENCOUNTER — Other Ambulatory Visit: Payer: Self-pay | Admitting: Family Medicine

## 2019-07-28 MED ORDER — PROPRANOLOL HCL 20 MG PO TABS: 20.0000 mg | ORAL_TABLET | Freq: Two times a day (BID) | ORAL | 3 refills | Status: DC

## 2019-07-28 NOTE — Telephone Encounter (Signed)
[facility] refill request for the following medications:  propranolol (INDERAL) 20 MG tablet  Last Rx: 06/16/2018 90 day supply 3 refills LOV: 05/13/2019 Please advise. Thanks TNP

## 2019-08-12 ENCOUNTER — Other Ambulatory Visit: Payer: Self-pay

## 2019-08-12 ENCOUNTER — Other Ambulatory Visit: Payer: Self-pay | Admitting: Family Medicine

## 2019-08-12 ENCOUNTER — Encounter: Payer: Self-pay | Admitting: Family Medicine

## 2019-08-12 MED ORDER — ATORVASTATIN CALCIUM 20 MG PO TABS: 20.0000 mg | ORAL_TABLET | Freq: Every day | ORAL | 3 refills | Status: DC

## 2019-08-12 MED ORDER — METFORMIN HCL 1000 MG PO TABS: 500.0000 mg | ORAL_TABLET | Freq: Two times a day (BID) | ORAL | 3 refills | Status: DC

## 2019-08-12 MED ORDER — LEVOTHYROXINE SODIUM 25 MCG PO TABS: ORAL_TABLET | ORAL | 3 refills | Status: DC

## 2019-08-12 NOTE — Telephone Encounter (Signed)
[facility] refill request for the following medications:  levothyroxine (SYNTHROID, LEVOTHROID) 25 MCG tablet metFORMIN (GLUCOPHAGE) 1000 MG tablet atorvastatin (LIPITOR) 20 MG tablet   Please advise.  Thanks, [employer]

## 2019-08-13 ENCOUNTER — Other Ambulatory Visit: Payer: Self-pay | Admitting: Family Medicine

## 2019-08-13 MED ORDER — OMEPRAZOLE 20 MG PO CPDR: 20.0000 mg | DELAYED_RELEASE_CAPSULE | Freq: Every day | ORAL | 1 refills | Status: DC

## 2019-08-13 NOTE — Telephone Encounter (Signed)
[facility] fax[facility] refill request for the following medications:  omeprazole (PRILOSEC) 20 MG capsule - needing refills   Please advise.  Thanks, [employer]

## 2019-08-31 ENCOUNTER — Telehealth: Payer: Self-pay | Admitting: Pharmacy Technician

## 2019-08-31 NOTE — Telephone Encounter (Signed)
Received 2021 proof of income.  Patient eligible to receive medication assistance at [facility] until time for re-certification in 7124, and as long as eligibility requirements continue to be met.  [facility]

## 2019-11-11 ENCOUNTER — Encounter: Payer: Self-pay | Admitting: Family Medicine

## 2019-11-11 ENCOUNTER — Ambulatory Visit (INDEPENDENT_AMBULATORY_CARE_PROVIDER_SITE_OTHER): Payer: Self-pay | Admitting: Family Medicine

## 2019-11-11 ENCOUNTER — Other Ambulatory Visit: Payer: Self-pay

## 2019-11-11 VITALS — BP 102/61 | HR 87 | Temp 97.1°F | Resp 16 | Wt 114.6 lb

## 2019-11-11 DIAGNOSIS — E785 Hyperlipidemia, unspecified: Secondary | ICD-10-CM

## 2019-11-11 DIAGNOSIS — K219 Gastro-esophageal reflux disease without esophagitis: Secondary | ICD-10-CM

## 2019-11-11 DIAGNOSIS — E039 Hypothyroidism, unspecified: Secondary | ICD-10-CM

## 2019-11-11 DIAGNOSIS — E1169 Type 2 diabetes mellitus with other specified complication: Secondary | ICD-10-CM

## 2019-11-11 DIAGNOSIS — I890 Lymphedema, not elsewhere classified: Secondary | ICD-10-CM

## 2019-11-11 DIAGNOSIS — I10 Essential (primary) hypertension: Secondary | ICD-10-CM

## 2019-11-11 LAB — POCT GLYCOSYLATED HEMOGLOBIN (HGB A1C)
Est. average glucose Bld gHb Est-mCnc: 154
Hemoglobin A1C: 7 % — AB (ref 4.0–5.6)

## 2019-11-11 LAB — POCT UA - MICROALBUMIN: Microalbumin Ur, POC: 20 mg/L

## 2019-11-11 MED ORDER — FAMOTIDINE 20 MG PO TABS: 20.0000 mg | ORAL_TABLET | Freq: Two times a day (BID) | ORAL | 3 refills | Status: DC

## 2019-11-11 NOTE — Assessment & Plan Note (Signed)
Well controlled Continue current medications Recheck metabolic panel F/u in 6 months  

## 2019-11-11 NOTE — Assessment & Plan Note (Addendum)
Previously well controlled Continue statin Repeat FLP and CMP Goal LDL less than 70

## 2019-11-11 NOTE — Assessment & Plan Note (Addendum)
Stable A1c 7.0 Continue metformin  Urine Microalbumin checked today Referred to [facility] to get pneumovax - will discuss with her family On statin Up-to-date on foot exam

## 2019-11-11 NOTE — Patient Instructions (Addendum)
Please call back for referral to Occupational therapy. Please call or go to [facility] for pneumonia vaccine.  Diabetes Mellitus and Nutrition, Adult When you have diabetes (diabetes mellitus), it is very important to have healthy eating habits because your blood sugar (glucose) levels are greatly affected by what you eat and drink. Eating healthy foods in the appropriate amounts, at about the same times every day, can help you:  Control your blood glucose.  Lower your risk of heart disease.  Improve your blood pressure.  Reach or maintain a healthy weight. Every person with diabetes is different, and each person has different needs for a meal plan. Your health care provider may recommend that you work with a diet and nutrition specialist (dietitian) to make a meal plan that is best for you. Your meal plan may vary depending on factors such as:  The calories you need.  The medicines you take.  Your weight.  Your blood glucose, blood pressure, and cholesterol levels.  Your activity level.  Other health conditions you have, such as heart or kidney disease. How do carbohydrates affect me? Carbohydrates, also called carbs, affect your blood glucose level more than any other type of food. Eating carbs naturally raises the amount of glucose in your blood. Carb counting is a method for keeping track of how many carbs you eat. Counting carbs is important to keep your blood glucose at a healthy level, especially if you use insulin or take certain oral diabetes medicines. It is important to know how many carbs you can safely have in each meal. This is different for every person. Your dietitian can help you calculate how many carbs you should have at each meal and for each snack. Foods that contain carbs include:  Bread, cereal, rice, pasta, and crackers.  Potatoes and corn.  Peas, beans, and lentils.  Milk and yogurt.  Fruit and juice.  Desserts, such as cakes,  cookies, ice cream, and candy. How does alcohol affect me? Alcohol can cause a sudden decrease in blood glucose (hypoglycemia), especially if you use insulin or take certain oral diabetes medicines. Hypoglycemia can be a life-threatening condition. Symptoms of hypoglycemia (sleepiness, dizziness, and confusion) are similar to symptoms of having too much alcohol. If your health care provider says that alcohol is safe for you, follow these guidelines:  Limit alcohol intake to no more than 1 drink per day for nonpregnant women and 2 drinks per day for men. One drink equals 12 oz of beer, 5 oz of wine, or 1 oz of hard liquor.  Do not drink on an empty stomach.  Keep yourself hydrated with water, diet soda, or unsweetened iced tea.  Keep in mind that regular soda, juice, and other mixers may contain a lot of sugar and must be counted as carbs. What are tips for following this plan?  Reading food labels  Start by checking the serving size on the "Nutrition Facts" label of packaged foods and drinks. The amount of calories, carbs, fats, and other nutrients listed on the label is based on one serving of the item. Many items contain more than one serving per package.  Check the total grams (g) of carbs in one serving. You can calculate the number of servings of carbs in one serving by dividing the total carbs by 15. For example, if a food has 30 g of total carbs, it would be equal to 2 servings of carbs.  Check the number of grams (g) of saturated and  trans fats in one serving. Choose foods that have low or no amount of these fats.  Check the number of milligrams (mg) of salt (sodium) in one serving. Most people should limit total sodium intake to less than 2,300 mg per day.  Always check the nutrition information of foods labeled as "low-fat" or "nonfat". These foods may be higher in added sugar or refined carbs and should be avoided.  Talk to your dietitian to identify your daily goals for  nutrients listed on the label. Shopping  Avoid buying canned, premade, or processed foods. These foods tend to be high in fat, sodium, and added sugar.  Shop around the outside edge of the grocery store. This includes fresh fruits and vegetables, bulk grains, fresh meats, and fresh dairy. Cooking  Use low-heat cooking methods, such as baking, instead of high-heat cooking methods like deep frying.  Cook using healthy oils, such as olive, canola, or sunflower oil.  Avoid cooking with butter, cream, or high-fat meats. Meal planning  Eat meals and snacks regularly, preferably at the same times every day. Avoid going long periods of time without eating.  Eat foods high in fiber, such as fresh fruits, vegetables, beans, and whole grains. Talk to your dietitian about how many servings of carbs you can eat at each meal.  Eat 4-6 ounces (oz) of lean protein each day, such as lean meat, chicken, fish, eggs, or tofu. One oz of lean protein is equal to: ? 1 oz of meat, chicken, or fish. ? 1 egg. ?  cup of tofu.  Eat some foods each day that contain healthy fats, such as avocado, nuts, seeds, and fish. Lifestyle  Check your blood glucose regularly.  Exercise regularly as told by your health care provider. This may include: ? 150 minutes of moderate-intensity or vigorous-intensity exercise each week. This could be brisk walking, biking, or water aerobics. ? Stretching and doing strength exercises, such as yoga or weightlifting, at least 2 times a week.  Take medicines as told by your health care provider.  Do not use any products that contain nicotine or tobacco, such as cigarettes and e-cigarettes. If you need help quitting, ask your health care provider.  Work with a Social worker or diabetes educator to identify strategies to manage stress and any emotional and social challenges. Questions to ask a health care provider  Do I need to meet with a diabetes educator?  Do I need to meet with a  dietitian?  What number can I call if I have questions?  When are the best times to check my blood glucose? Where to find more information:  American Diabetes Association: diabetes.org  Academy of Nutrition and Dietetics: www.eatright.[employer] of Diabetes and Digestive and Kidney Diseases (NIH): [employer] Summary  A healthy meal plan will help you control your blood glucose and maintain a healthy lifestyle.  Working with a diet and nutrition specialist (dietitian) can help you make a meal plan that is best for you.  Keep in mind that carbohydrates (carbs) and alcohol have immediate effects on your blood glucose levels. It is important to count carbs and to use alcohol carefully. This information is not intended to replace advice given to you by your health care provider. Make sure you discuss any questions you have with your health care provider. Document Revised: 06/07/2017 Document Reviewed: 07/30/2016 Elsevier Patient Education  2020 [employer].

## 2019-11-11 NOTE — Assessment & Plan Note (Addendum)
Patient to continue taking Pepcid New prescription sent DC omeprazole

## 2019-11-11 NOTE — Assessment & Plan Note (Signed)
Patient reports using compression sleeve Reports no swelling improvement Advised to refer for OT Patient will discuss with her daughter They will call back if they decide to proceed with OT referral

## 2019-11-11 NOTE — Progress Notes (Signed)
Established patient visit   Patient: Erin Wagner   DOB: [birth year]   79 y.o. Female  MRN: [id number] Visit Date: 11/11/2019  I,Sulibeya S Dimas,acting as a scribe for Lavon Paganini, MD.,have documented all relevant documentation on the behalf of Lavon Paganini, MD,as directed by  Lavon Paganini, MD while in the presence of Lavon Paganini, MD.  Today's healthcare provider: Lavon Paganini, MD   Chief Complaint  Patient presents with  . Hypertension  . Hyperlipidemia  . Diabetes   Subjective    HPI   Declined formal interpreter.  Granddaughter is present and acts as Astronomer.   Hypertension, follow-up  BP Readings from Last 3 Encounters:  11/11/19 102/61  07/23/19 (!) 144/59  05/13/19 123/72   Wt Readings from Last 3 Encounters:  11/11/19 114 lb 9.6 oz (52 kg)  07/23/19 114 lb 9.6 oz (52 kg)  05/13/19 114 lb (51.7 kg)     She was last seen for hypertension 6 months ago.  BP at that visit was 123/72. Management since that visit includes discontinue Amlodipine, continue propranolol.  She reports excellent compliance with treatment. She is not having side effects.  She is following a Regular diet. She is not exercising. She does not smoke.  Use of agents associated with hypertension: none.   Outside blood pressures are stable. Symptoms: No chest pain No chest pressure No palpitations No dyspnea No orthopnea No paroxysmal nocturnal dyspnea No lower extremity edema No syncope   Pertinent labs: Lab Results  Component Value Date   CHOL 133 07/14/2018   HDL 46 07/14/2018   LDLCALC 47 07/14/2018   TRIG 200 (H) 07/14/2018   CHOLHDL 2.9 07/14/2018   Lab Results  Component Value Date   NA 135 05/13/2019   K 4.5 05/13/2019   CO2 21 05/13/2019   GLUCOSE 110 (H) 05/13/2019   BUN 13 05/13/2019   CREATININE 1.04 (H) 05/13/2019   CALCIUM 9.8 05/13/2019   GFRNONAA 52 (L) 05/13/2019   GFRAA 59 (L) 05/13/2019     The 10-year  ASCVD risk score Mikey Bussing DC Jr., et al., 2013) is: 36.5%   --------------------------------------------------------------------------------------------------- Lipid/Cholesterol, Follow-up  Last lipid panel Other pertinent labs  Lab Results  Component Value Date   CHOL 133 07/14/2018   HDL 46 07/14/2018   LDLCALC 47 07/14/2018   TRIG 200 (H) 07/14/2018   CHOLHDL 2.9 07/14/2018   Lab Results  Component Value Date   ALT 15 07/14/2018   AST 22 07/14/2018   PLT 296 01/24/2018   TSH 2.990 05/13/2019     She was last seen for this 6 months ago.  Management since that visit includes no changes.  She reports excellent compliance with treatment. She is not having side effects.  Symptoms: No chest pain No chest pressure/discomfort No dyspnea No lower extremity edema No numbness or tingling of extremity No orthopnea No palpitations No paroxysmal nocturnal dyspnea No speech difficulty No syncope  Current diet: in general, a "healthy" diet   Current exercise: housecleaning, no regular exercise and walking  Wt Readings from Last 3 Encounters:  11/11/19 114 lb 9.6 oz (52 kg)  07/23/19 114 lb 9.6 oz (52 kg)  05/13/19 114 lb (51.7 kg)   The 10-year ASCVD risk score Mikey Bussing DC Jr., et al., 2013) is: 36.5%  ----------------------------------------------------------------------------------------- Diabetes Mellitus Type II, Follow-up  Lab Results  Component Value Date   HGBA1C 7.0 (A) 11/11/2019   HGBA1C 6.8 (H) 05/13/2019   HGBA1C 6.5 (A) 11/10/2018   Last  seen for diabetes 6 months ago.  Management since then includes no changes. She reports excellent compliance with treatment. She is not having side effects.  Symptoms: No fatigue No foot ulcerations No appetite changes No nausea No paresthesia (numbness or tingling) of the feet  No polydipsia (excessive thirst) No polyuria (frequent urination) No visual disturbances  No vomiting  Home blood sugar records: not being  checked  Episodes of hypoglycemia? No    Current insulin regiment: none Most Recent Eye Exam: not up to date Current exercise: housecleaning, no regular exercise and walking Current diet habits: in general, a "healthy" diet    Pertinent Labs: Lab Results  Component Value Date   CHOL 133 07/14/2018   HDL 46 07/14/2018   LDLCALC 47 07/14/2018   TRIG 200 (H) 07/14/2018   CHOLHDL 2.9 07/14/2018   Lab Results  Component Value Date   NA 135 05/13/2019   K 4.5 05/13/2019   CO2 21 05/13/2019   GLUCOSE 110 (H) 05/13/2019   BUN 13 05/13/2019   CREATININE 1.04 (H) 05/13/2019   CALCIUM 9.8 05/13/2019   GFRNONAA 52 (L) 05/13/2019   GFRAA 59 (L) 05/13/2019     Wt Readings from Last 3 Encounters:  11/11/19 114 lb 9.6 oz (52 kg)  07/23/19 114 lb 9.6 oz (52 kg)  05/13/19 114 lb (51.7 kg)    ---------------------------------------------------------------------------------------------------   Patient Active Problem List   Diagnosis Date Noted  . Hypothyroidism 05/13/2019  . Essential tremor 05/13/2019  . Allergic conjunctivitis of both eyes 05/13/2019  . Lymphedema 12/13/2017  . History of cataract 09/09/2017  . Depression 09/09/2017  . Health care maintenance 06/27/2017  . History of breast cancer 06/27/2017  . Diabetes mellitus without complication ([place]) [id number]  . Hyperlipidemia associated with type 2 diabetes mellitus ([place]) 06/27/2017  . GERD (gastroesophageal reflux disease) 06/27/2017  . Hypertension 06/27/2017   Social History   Tobacco Use  . Smoking status: Never Smoker  . Smokeless tobacco: Never Used  Substance Use Topics  . Alcohol use: No  . Drug use: No       Medications: Outpatient Medications Prior to Visit  Medication Sig  . atorvastatin (LIPITOR) 20 MG tablet Take 1 tablet (20 mg total) by mouth daily.  . Calcium Carbonate-Vitamin D (CALCIUM 600+D) 600-400 MG-UNIT tablet Take 1 tablet by mouth daily.   Marland Kitchen levothyroxine (SYNTHROID) 25 MCG tablet  Take 1/2 tablet daily  . metFORMIN (GLUCOPHAGE) 1000 MG tablet Take 0.5 tablets (500 mg total) by mouth 2 (two) times daily with a meal.  . Multiple Vitamin (MULTIVITAMIN) tablet Take 1 tablet by mouth daily.  . propranolol (INDERAL) 20 MG tablet Take 1 tablet (20 mg total) by mouth 2 (two) times daily.  . sertraline (ZOLOFT) 25 MG tablet Take 1 tablet (25 mg total) by mouth daily.  . tamoxifen (NOLVADEX) 20 MG tablet Take 1 tablet (20 mg total) by mouth daily.  . [DISCONTINUED] famotidine (PEPCID) 20 MG tablet Take 1 tablet (20 mg total) by mouth 2 (two) times daily.  . [DISCONTINUED] omeprazole (PRILOSEC) 20 MG capsule Take 1 capsule (20 mg total) by mouth daily.   No facility-administered medications prior to visit.    Review of Systems  Constitutional: Negative.   HENT: Negative.   Respiratory: Negative.   Cardiovascular: Negative.   Gastrointestinal: Negative.   Neurological: Negative.   Psychiatric/Behavioral: Negative.     Last metabolic panel Lab Results  Component Value Date   GLUCOSE 110 (H) 05/13/2019   NA 135  05/13/2019   K 4.5 05/13/2019   CL 97 05/13/2019   CO2 21 05/13/2019   BUN 13 05/13/2019   CREATININE 1.04 (H) 05/13/2019   GFRNONAA 52 (L) 05/13/2019   GFRAA 59 (L) 05/13/2019   CALCIUM 9.8 05/13/2019   PROT 6.8 07/14/2018   ALBUMIN 4.0 07/14/2018   LABGLOB 2.8 07/14/2018   AGRATIO 1.4 07/14/2018   BILITOT 0.3 07/14/2018   ALKPHOS 38 (L) 07/14/2018   AST 22 07/14/2018   ALT 15 07/14/2018   ANIONGAP 9 03/07/2018   Last lipids Lab Results  Component Value Date   CHOL 133 07/14/2018   HDL 46 07/14/2018   LDLCALC 47 07/14/2018   TRIG 200 (H) 07/14/2018   CHOLHDL 2.9 07/14/2018   Last hemoglobin A1c Lab Results  Component Value Date   HGBA1C 7.0 (A) 11/11/2019   Last thyroid functions Lab Results  Component Value Date   TSH 2.990 05/13/2019      Objective    BP 102/61 (BP Location: Left Arm, Patient Position: Sitting, Cuff Size: Normal)    Pulse 87   Temp (!) 97.1 F (36.2 C) (Temporal)   Resp 16   Wt 114 lb 9.6 oz (52 kg)   BMI 27.63 kg/m  BP Readings from Last 3 Encounters:  11/11/19 102/61  07/23/19 (!) 144/59  05/13/19 123/72   Wt Readings from Last 3 Encounters:  11/11/19 114 lb 9.6 oz (52 kg)  07/23/19 114 lb 9.6 oz (52 kg)  05/13/19 114 lb (51.7 kg)      Physical Exam Vitals reviewed.  Constitutional:      General: She is not in acute distress.    Appearance: She is well-developed.  HENT:     Head: Normocephalic and atraumatic.  Eyes:     General: No scleral icterus.       Right eye: No discharge.        Left eye: No discharge.     Conjunctiva/sclera: Conjunctivae normal.  Neck:     Thyroid: No thyromegaly.  Cardiovascular:     Rate and Rhythm: Normal rate and regular rhythm.     Heart sounds: Normal heart sounds. No murmur.  Pulmonary:     Effort: Pulmonary effort is normal. No respiratory distress.     Breath sounds: Normal breath sounds. No wheezing or rales.  Musculoskeletal:     Cervical back: Neck supple.     Right lower leg: No edema.     Left lower leg: No edema.     Comments: Lymphedema of bilateral upper extremities, left greater than right  Lymphadenopathy:     Cervical: No cervical adenopathy.  Skin:    General: Skin is warm and dry.     Capillary Refill: Capillary refill takes less than 2 seconds.     Findings: No rash.  Neurological:     Mental Status: She is alert. Mental status is at baseline.  Psychiatric:        Mood and Affect: Mood normal.        Behavior: Behavior normal.     Results for orders placed or performed in visit on 11/11/19  POCT glycosylated hemoglobin (Hb A1C)  Result Value Ref Range   Hemoglobin A1C 7.0 (A) 4.0 - 5.6 %   Est. average glucose Bld gHb Est-mCnc 154   POCT UA - Microalbumin  Result Value Ref Range   Microalbumin Ur, POC 20 mg/L    Assessment & Plan     Problem List Items Addressed This Visit  Cardiovascular and  Mediastinum   Hypertension - Primary    Well controlled Continue current medications Recheck metabolic panel F/u in 6 months       Relevant Orders   Comprehensive metabolic panel     Digestive   GERD (gastroesophageal reflux disease)    Patient to continue taking Pepcid New prescription sent DC omeprazole      Relevant Medications   famotidine (PEPCID) 20 MG tablet     Endocrine   Diabetes mellitus without complication (H[facility])    Stable A1c 7.0 Continue metformin  Urine Microalbumin checked today Referred to [facility] to get pneumovax - will discuss with her family On statin Up-to-date on foot exam       Hyperlipidemia associated with type 2 diabetes mellitus ([place])    Previously well controlled Continue statin Repeat FLP and CMP Goal LDL less than 70      Relevant Orders   Lipid Panel With LDL/HDL Ratio   Hypothyroidism    Previously well controlled Continue Synthroid at current dose  Recheck TSH and adjust Synthroid as indicated        Relevant Orders   TSH     Other   Lymphedema    Patient reports using compression sleeve Reports no swelling improvement Advised to refer for OT Patient will discuss with her daughter They will call back if they decide to proceed with OT referral          Return in about 6 months (around 05/13/2020) for chronic disease f/u.      I, Lavon Paganini, MD, have reviewed all documentation for this visit. The documentation on 11/11/19 for the exam, diagnosis, procedures, and orders are all accurate and complete.   Fynn Vanblarcom, Dionne Bucy, MD, MPH [facility]

## 2019-11-11 NOTE — Assessment & Plan Note (Signed)
Previously well controlled Continue Synthroid at current dose  Recheck TSH and adjust Synthroid as indicated   

## 2019-11-12 ENCOUNTER — Telehealth: Payer: Self-pay

## 2019-11-12 LAB — COMPREHENSIVE METABOLIC PANEL
ALT: 18 IU/L (ref 0–32)
AST: 24 IU/L (ref 0–40)
Albumin/Globulin Ratio: 1.4 (ref 1.2–2.2)
Albumin: 4.2 g/dL (ref 3.7–4.7)
Alkaline Phosphatase: 42 IU/L (ref 39–117)
BUN/Creatinine Ratio: 13 (ref 12–28)
BUN: 13 mg/dL (ref 8–27)
Bilirubin Total: <0.2 mg/dL (ref 0.0–1.2)
CO2: 20 mmol/L (ref 20–29)
Calcium: 10 mg/dL (ref 8.7–10.3)
Chloride: 101 mmol/L (ref 96–106)
Creatinine, Ser: 1.04 mg/dL — ABNORMAL HIGH (ref 0.57–1.00)
GFR calc Af Amer: 59 mL/min/{1.73_m2} — ABNORMAL LOW (ref >59)
GFR calc non Af Amer: 51 mL/min/{1.73_m2} — ABNORMAL LOW (ref >59)
Globulin, Total: 2.9 g/dL (ref 1.5–4.5)
Glucose: 118 mg/dL — ABNORMAL HIGH (ref 65–99)
Potassium: 4.8 mmol/L (ref 3.5–5.2)
Sodium: 136 mmol/L (ref 134–144)
Total Protein: 7.1 g/dL (ref 6.0–8.5)

## 2019-11-12 LAB — TSH: TSH: 3.26 u[IU]/mL (ref 0.450–4.500)

## 2019-11-12 LAB — LIPID PANEL WITH LDL/HDL RATIO
Cholesterol, Total: 130 mg/dL (ref 100–199)
HDL: 47 mg/dL (ref >39)
LDL Chol Calc (NIH): 45 mg/dL (ref 0–99)
LDL/HDL Ratio: 1 ratio (ref 0.0–3.2)
Triglycerides: 247 mg/dL — ABNORMAL HIGH (ref 0–149)
VLDL Cholesterol Cal: 38 mg/dL (ref 5–40)

## 2019-11-12 NOTE — Telephone Encounter (Signed)
-----   Message from Virginia Crews, MD sent at 11/12/2019  8:14 AM EDT ----- Normal/stable labs.  No changes to medications

## 2019-11-12 NOTE — Telephone Encounter (Signed)
LMTCB, PEC may give result to daughter in law or Son per DPR.

## 2019-11-13 NOTE — Telephone Encounter (Signed)
Left message to return call for results

## 2019-11-16 NOTE — Telephone Encounter (Signed)
Comment seen by patient Erin Wagner on 11/15/2019 10:17 AM EDT

## 2019-12-16 ENCOUNTER — Other Ambulatory Visit: Payer: Self-pay | Admitting: Family Medicine

## 2019-12-16 MED ORDER — SERTRALINE HCL 25 MG PO TABS: 25.0000 mg | ORAL_TABLET | Freq: Every day | ORAL | 3 refills | Status: DC

## 2019-12-16 NOTE — Telephone Encounter (Signed)
Medication management clinic is requesting refills on Sertraline 25 mg.

## 2020-01-18 ENCOUNTER — Encounter: Payer: Self-pay | Admitting: Oncology

## 2020-01-18 ENCOUNTER — Other Ambulatory Visit: Payer: Self-pay

## 2020-01-18 ENCOUNTER — Inpatient Hospital Stay: Payer: Self-pay | Attending: Oncology

## 2020-01-18 ENCOUNTER — Inpatient Hospital Stay (HOSPITAL_BASED_OUTPATIENT_CLINIC_OR_DEPARTMENT_OTHER): Payer: Self-pay | Admitting: Oncology

## 2020-01-18 VITALS — BP 141/65 | HR 77 | Temp 96.8°F | Resp 18 | Wt 107.8 lb

## 2020-01-18 DIAGNOSIS — Z5181 Encounter for therapeutic drug level monitoring: Secondary | ICD-10-CM

## 2020-01-18 DIAGNOSIS — Z853 Personal history of malignant neoplasm of breast: Secondary | ICD-10-CM

## 2020-01-18 DIAGNOSIS — Z79810 Long term (current) use of selective estrogen receptor modulators (SERMs): Secondary | ICD-10-CM

## 2020-01-18 DIAGNOSIS — Z08 Encounter for follow-up examination after completed treatment for malignant neoplasm: Secondary | ICD-10-CM

## 2020-01-18 DIAGNOSIS — E119 Type 2 diabetes mellitus without complications: Secondary | ICD-10-CM | POA: Insufficient documentation

## 2020-01-18 DIAGNOSIS — C50911 Malignant neoplasm of unspecified site of right female breast: Secondary | ICD-10-CM | POA: Insufficient documentation

## 2020-01-18 DIAGNOSIS — C50912 Malignant neoplasm of unspecified site of left female breast: Secondary | ICD-10-CM | POA: Insufficient documentation

## 2020-01-18 DIAGNOSIS — R609 Edema, unspecified: Secondary | ICD-10-CM | POA: Insufficient documentation

## 2020-01-18 DIAGNOSIS — Z7984 Long term (current) use of oral hypoglycemic drugs: Secondary | ICD-10-CM | POA: Insufficient documentation

## 2020-01-18 DIAGNOSIS — E785 Hyperlipidemia, unspecified: Secondary | ICD-10-CM | POA: Insufficient documentation

## 2020-01-18 DIAGNOSIS — Z9221 Personal history of antineoplastic chemotherapy: Secondary | ICD-10-CM | POA: Insufficient documentation

## 2020-01-18 DIAGNOSIS — I10 Essential (primary) hypertension: Secondary | ICD-10-CM | POA: Insufficient documentation

## 2020-01-18 DIAGNOSIS — Z170 Estrogen receptor positive status [ER+]: Secondary | ICD-10-CM | POA: Insufficient documentation

## 2020-01-18 DIAGNOSIS — M81 Age-related 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 fracture: Secondary | ICD-10-CM | POA: Insufficient documentation

## 2020-01-18 DIAGNOSIS — Z79899 Other long term (current) drug therapy: Secondary | ICD-10-CM | POA: Insufficient documentation

## 2020-01-18 DIAGNOSIS — Z923 Personal history of irradiation: Secondary | ICD-10-CM | POA: Insufficient documentation

## 2020-01-18 LAB — CBC WITH DIFFERENTIAL/PLATELET
HCT: UNDETERMINED % (ref 36.0–46.0)
Hemoglobin: UNDETERMINED g/dL (ref 12.0–15.0)
MCH: UNDETERMINED pg (ref 26.0–34.0)
MCHC: UNDETERMINED g/dL (ref 30.0–36.0)
MCV: UNDETERMINED fL (ref 80.0–100.0)
Platelets: UNDETERMINED 10*3/uL (ref 150–400)
RBC: UNDETERMINED MIL/uL (ref 3.87–5.11)
RDW: UNDETERMINED % (ref 11.5–15.5)
WBC: UNDETERMINED 10*3/uL (ref 4.0–10.5)
nRBC: UNDETERMINED % (ref 0.0–0.2)

## 2020-01-18 LAB — COMPREHENSIVE METABOLIC PANEL
ALT: 21 U/L (ref 0–44)
AST: 29 U/L (ref 15–41)
Albumin: 3.8 g/dL (ref 3.5–5.0)
Alkaline Phosphatase: 33 U/L — ABNORMAL LOW (ref 38–126)
Anion gap: 12 (ref 5–15)
BUN: 16 mg/dL (ref 8–23)
CO2: 20 mmol/L — ABNORMAL LOW (ref 22–32)
Calcium: 9.1 mg/dL (ref 8.9–10.3)
Chloride: 101 mmol/L (ref 98–111)
Creatinine, Ser: 1.07 mg/dL — ABNORMAL HIGH (ref 0.44–1.00)
GFR calc Af Amer: 57 mL/min — ABNORMAL LOW (ref >60)
GFR calc non Af Amer: 49 mL/min — ABNORMAL LOW (ref >60)
Glucose, Bld: 129 mg/dL — ABNORMAL HIGH (ref 70–99)
Potassium: 4.2 mmol/L (ref 3.5–5.1)
Sodium: 133 mmol/L — ABNORMAL LOW (ref 135–145)
Total Bilirubin: 0.3 mg/dL (ref 0.3–1.2)
Total Protein: 7.3 g/dL (ref 6.5–8.1)

## 2020-01-18 NOTE — Progress Notes (Signed)
Pt in for follow up with caregiver.  Caregiver denies any concerns today. Lab was unable to obtain specimen today after multiple sticks.

## 2020-01-25 NOTE — Progress Notes (Signed)
Hematology/Oncology Consult note [facility]  Telephone:([phone] [phone]  Patient Care Team: Virginia Crews, MD as PCP - General (Family Medicine)   Name of the patient: Erin Wagner  [id number]  October 01, 1940   Date of visit: 01/25/20  Diagnosis-  h/o b/l breast cancer in [place] currently on tamoxifen   Chief complaint/ Reason for visit-routine follow-up of breast cancer on tamoxifen  Heme/Onc history: Patient is a 79 year old [place] speaking female from [place] with a past medical history significant for hypertension hyperlipidemia, diabetes and bilateral breast cancer.She is here with son and daughter in law. She has refused formal language interpretor and would like her family to interpret for her. Her family is comfortable speaking in Hindi with me and translating for her.  Her oncology history is as follows:  1.She was diagnosed with bilateral breast cancer in July 2017 in [place]. She initially received neoadjuvant chemotherapy with 3 cycles of epirubicin and cyclophosphamide. This was followed by bilateral modified radical mastectomy. I do not have the actual pathology report from [place]. 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 I am unable to decipher the written handwriting well. Appears to have 1 out of 13 lymph nodes positive with PNE positive, no LV I or perineural invasion. Left breast mass showed 6 cm mixed infiltrating ductal and lobular histology. Again handwritten notes mention 3 out of 16 lymph nodes positive. In some other page on theoutside report, the right breast cancer has been mentioned as T2 N0 in the left breast cancer has been mentioned as T4N0.  2.Following surgery she had 1 cycle of epirubicin and cyclophosphamide and 2 cycles of doxorubicin. She then had IMRT to her bilateral breasts  over 5 weeks. She was then asked to take tamoxifen for 6 months only. There is no mention about ER PR and HER-2 status anywhere in the outside reports. Patient was asked to take tamoxifen for 6 months following radiation and then stop it.   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 Since definitive evidence of ER status was not obtainable,given history of bilateral node positive breast cancer and was to proceed with tamoxifen assuming that it was ER PR positive. Tamoxifen 20 mg started July 2019. Fosamax weekly started for osteoporosis. She also had CT chest abdomen and pelvis as well as bone scan which did not reveal any evidence of malignancy. Focally increased activity in the left second rib to the presence of subacute fracture.   Interval history-I communicate with patient and her son in [place] which is a language both of them understand. Patient continues to have issues with her left upper extremity swelling from lymphedema. Appetite and weight is stable. She remains independent of her ADLs.  ECOG PS- 1 Pain scale- 0   Review of systems- Review of Systems  Constitutional: Negative for chills, fever, malaise/fatigue and weight loss.  HENT: Negative for congestion, ear discharge and nosebleeds.   Eyes: Negative for blurred vision.  Respiratory: Negative for cough, hemoptysis, sputum production, shortness of breath and wheezing.   Cardiovascular: Negative for chest pain, palpitations, orthopnea and claudication.  Gastrointestinal: Negative for abdominal pain, blood in stool, constipation, diarrhea, heartburn, melena, nausea and  vomiting.  Genitourinary: Negative for dysuria, flank pain, frequency, hematuria and urgency.  Musculoskeletal: Negative for back  pain, joint pain and myalgias.       Left upper extremity swelling  Skin: Negative for rash.  Neurological: Negative for dizziness, tingling, focal weakness, seizures, weakness and headaches.  Endo/Heme/Allergies: Does not bruise/bleed easily.  Psychiatric/Behavioral: Negative for depression and suicidal ideas. The patient does not have insomnia.       No Known Allergies   Past Medical History:  Diagnosis Date  . Cancer (HCC)    breast  . Cataract   . Diabetes mellitus without complication (HCC)   . Hyperlipidemia   . Hypertension   . Thyroid disease      Past Surgical History:  Procedure Laterality Date  . BREAST SURGERY    . EYE SURGERY      Social History   Socioeconomic History  . Marital status: Widowed    Spouse name: Not on file  . Number of children: 4  . Years of education: Not on file  . Highest education level: Not on file  Occupational History    Employer: retired    Comment: homemaker  Tobacco Use  . Smoking status: Never Smoker  . Smokeless tobacco: Never Used  Vaping Use  . Vaping Use: Never used  Substance and Sexual Activity  . Alcohol use: No  . Drug use: No  . Sexual activity: Not on file  Other Topics Concern  . Not on file  Social History Narrative  . Not on file   Social Determinants of Health   Financial Resource Strain:   . Difficulty of Paying Living Expenses:   Food Insecurity:   . Worried About Running Out of Food in the Last Year:   . Ran Out of Food in the Last Year:   Transportation Needs:   . Lack of Transportation (Medical):   . Lack of Transportation (Non-Medical):   Physical Activity:   . Days of Exercise per Week:   . Minutes of Exercise per Session:   Stress:   . Feeling of Stress :   Social Connections:   . Frequency of Communication with Friends and Family:   . Frequency of Social Gatherings with Friends and Family:   . Attends Religious Services:   . Active Member of Clubs or Organizations:   . Attends  Club or Organization Meetings:   . Marital Status:   Intimate Partner Violence:   . Fear of Current or Ex-Partner:   . Emotionally Abused:   . Physically Abused:   . Sexually Abused:     Family History  Problem Relation Age of Onset  . Healthy Sister   . Healthy Brother   . Healthy Daughter   . Healthy Sister   . Healthy Daughter   . Healthy Daughter      Current Outpatient Medications:  .  atorvastatin (LIPITOR) 20 MG tablet, Take 1 tablet (20 mg total) by mouth daily., Disp: 90 tablet, Rfl: 3 .  Calcium Carbonate-Vitamin D (CALCIUM 600+D) 600-400 MG-UNIT tablet, Take 1 tablet by mouth daily. , Disp: , Rfl:  .  famotidine (PEPCID) 20 MG tablet, Take 1 tablet (20 mg total) by mouth 2 (two) times daily., Disp: 180 tablet, Rfl: 3 .  levothyroxine (SYNTHROID) 25 MCG tablet, Take 1/2 tablet daily, Disp: 45 tablet, Rfl: 3 .  metFORMIN (GLUCOPHAGE) 1000 MG tablet, Take 0.5 tablets (500 mg total) by mouth 2 (two) times daily with a meal., Disp: 90 tablet, Rfl: 3 .    Multiple Vitamin (MULTIVITAMIN) tablet, Take 1 tablet by mouth daily., Disp: , Rfl:  .  propranolol (INDERAL) 20 MG tablet, Take 1 tablet (20 mg total) by mouth 2 (two) times daily., Disp: 180 tablet, Rfl: 3 .  sertraline (ZOLOFT) 25 MG tablet, Take 1 tablet (25 mg total) by mouth daily., Disp: 90 tablet, Rfl: 3 .  tamoxifen (NOLVADEX) 20 MG tablet, Take 1 tablet (20 mg total) by mouth daily., Disp: 90 tablet, Rfl: 3  Physical exam:  Vitals:   01/18/20 1541  BP: (!) 141/65  Pulse: 77  Resp: 18  Temp: (!) 96.8 F (36 C)  TempSrc: Tympanic  Weight: 107 lb 12.8 oz (48.9 kg)   Physical Exam Constitutional:      General: She is not in acute distress. Cardiovascular:     Rate and Rhythm: Normal rate and regular rhythm.     Heart sounds: Normal heart sounds.  Pulmonary:     Effort: Pulmonary effort is normal.     Breath sounds: Normal breath sounds.  Abdominal:     General: Bowel sounds are normal.     Palpations:  Abdomen is soft.  Musculoskeletal:     Comments: Left arm is more swollen than the right  Skin:    General: Skin is warm and dry.  Neurological:     Mental Status: She is alert and oriented to person, place, and time.   Chest wall exam: Patient is s/p bilateral mastectomy without reconstruction. No evidence of chest wall recurrence. No palpable bilateral axillary adenopathy.  CMP Latest Ref Rng & Units 01/18/2020  Glucose 70 - 99 mg/dL 129(H)  BUN 8 - 23 mg/dL 16  Creatinine 0.44 - 1.00 mg/dL 1.07(H)  Sodium 135 - 145 mmol/L 133(L)  Potassium 3.5 - 5.1 mmol/L 4.2  Chloride 98 - 111 mmol/L 101  CO2 22 - 32 mmol/L 20(L)  Calcium 8.9 - 10.3 mg/dL 9.1  Total Protein 6.5 - 8.1 g/dL 7.3  Total Bilirubin 0.3 - 1.2 mg/dL 0.3  Alkaline Phos 38 - 126 U/L 33(L)  AST 15 - 41 U/L 29  ALT 0 - 44 U/L 21   CBC Latest Ref Rng & Units 01/18/2020  WBC 4.0 - 10.5 K/uL UNABLE TO PERFORM TEST DUE TO LABORATORY ERROR  Hemoglobin 12.0 - 15.0 g/dL UNABLE TO PERFORM TEST DUE TO LABORATORY ERROR  Hematocrit 36 - 46 % UNABLE TO PERFORM TEST DUE TO LABORATORY ERROR  Platelets 150 - 400 K/uL UNABLE TO PERFORM TEST DUE TO LABORATORY ERROR     Assessment and plan- Patient is a 78 y.o. female  with bilateral breast cancer (stage is unclear and ER PR and her 2 neu status is also unclear) s/p neoadjuvant chemotherapy followed bilateral mastectomy without reconstruction, adjuvant chemotherapy, RT and 6 months of tamoxifenin 2017.Tamoxifen restarted in July 2019. This is a routine follow-up visit for breast cancer  Clinically patient is doing well with no concerning signs and symptoms of recurrence based on today's exam. She will continue to take tamoxifen along with calcium and vitamin D. I will repeat a bone density scan at this time for her osteoporosis.  I will refer the patient to Maureen Dupree for her left upper extremity lymphedema.  I will see her back in 6 months with CBC with differential and  CMP   Visit Diagnosis 1. Encounter for follow-up surveillance of breast cancer   2. Encounter for monitoring tamoxifen therapy      Dr. Archana Rao, MD, MPH [facility] [phone] 01/25/2020 1:46 PM                

## 2020-02-03 ENCOUNTER — Inpatient Hospital Stay: Payer: Self-pay | Admitting: Occupational Therapy

## 2020-02-03 ENCOUNTER — Other Ambulatory Visit: Payer: Self-pay

## 2020-02-03 ENCOUNTER — Other Ambulatory Visit: Payer: Self-pay | Admitting: *Deleted

## 2020-02-03 DIAGNOSIS — I972 Postmastectomy lymphedema syndrome: Secondary | ICD-10-CM

## 2020-02-03 DIAGNOSIS — I890 Lymphedema, not elsewhere classified: Secondary | ICD-10-CM

## 2020-02-03 NOTE — Therapy (Signed)
[facility] [address] [place], [place], [place], [address] Phone: [phone]   Fax:  [phone]  Occupational Therapy Screen  Patient Details  Name: Erin Wagner MRN: [id number] Date of Birth: 1941/04/10 No data recorded  Encounter Date: 02/03/2020   OT End of Session - 02/03/20 1636    Visit Number 0           Past Medical History:  Diagnosis Date  . Cancer ([place])    breast  . Cataract   . Diabetes mellitus without complication ([place])   . Hyperlipidemia   . Hypertension   . Thyroid disease     Past Surgical History:  Procedure Laterality Date  . BREAST SURGERY    . EYE SURGERY      NOTE FROM DR RAO VISIT ON 01/18/2020:  Assessment and plan- Patient is a 79 y.o. female  with bilateral breast cancer (stage is unclear and ER PR and her 2 neu status is also unclear) s/p neoadjuvant chemotherapy followed bilateral mastectomy without reconstruction, adjuvant chemotherapy, RT and 6 months of tamoxifenin 2017.Tamoxifen restarted in July 2019. This is a routine follow-up visit for breast cancer  Clinically patient is doing well with no concerning signs and symptoms of recurrence based on today's exam. She will continue to take tamoxifen along with calcium and vitamin D. I will repeat a bone density scan at this time for her osteoporosis.  I will refer the patient to Luna Fuse for her left upper extremity lymphedema.  I will see her back in 6 months with CBC with differential and CMP      OT SCREEN NOTE 02/03/2020:  There were no vitals filed for this visit. [place] daughter interprets - pt do not want translation service- son come in at end of session    Subjective Assessment - 02/03/20 1635    Subjective  Pt and granddaughter reports she had the swelling in the L arm for while and also on the R arm now - she wears 2 black sleeves during day - but do not help - L arm is hard - and some redness and  itching on the R forearm    Currently in Pain? No/denies           Per pt had lymphedema in arms for while - L worse than R -and about 13 ln removed on the L  Bilateral mastectomy with no reconstruction  Scar tissue with adhesions on bilateral chest  Pt report she has been wearing black daytime compression sleeves on bilateral UE - but not doing anything  Did not take measurements because pt do not CDT treatment to decongest bilateral UE and get fitted with correct daytime and night time compression garments  Pt is stage 2 lymphedema on the R And stage 1 - 2 in R UE - hard to assess - do not have baseline Pt report she has some itching and redness on dorsal R forearm - but not hot - ed family to watch out form cellulitis Pt and family was ed on lymphedema- anatomy , what it is and how it gets treated  As well as precautions  Pt and grand daughter ed on scar massage and mobs for her to start doing until I get her in for OT for CDT  Will get OT order for lymphedema treatment from Dr Janese Banks - son need later appt on Monday and Wed - and Friday can do am  Patient will benefit from skilled therapeutic intervention in order to improve the following deficits and impairments:           Visit Diagnosis: Lymphedema  Postmastectomy lymphedema syndrome    Problem List Patient Active Problem List   Diagnosis Date Noted  . Hypothyroidism 05/13/2019  . Essential tremor 05/13/2019  . Allergic conjunctivitis of both eyes 05/13/2019  . Lymphedema 12/13/2017  . History of cataract 09/09/2017  . Depression 09/09/2017  . Health care maintenance 06/27/2017  . History of breast cancer 06/27/2017  . Diabetes mellitus without complication ([place]) 56/81/2751  . Hyperlipidemia associated with type 2 diabetes mellitus ([place]) 06/27/2017  . GERD (gastroesophageal reflux disease) 06/27/2017  . Hypertension 06/27/2017    Rosalyn Gess  OTR/L,CLT 02/03/2020, 4:37 PM  [facility] [address] [place], [place], [place], [address] Phone: [phone]   Fax:  [phone]  Name: Erin Wagner MRN: [id number] Date of Birth: 12-04-1940

## 2020-02-08 ENCOUNTER — Ambulatory Visit: Payer: Self-pay | Attending: Oncology | Admitting: Occupational Therapy

## 2020-02-08 ENCOUNTER — Encounter: Payer: Self-pay | Admitting: Occupational Therapy

## 2020-02-08 ENCOUNTER — Other Ambulatory Visit: Payer: Self-pay

## 2020-02-08 DIAGNOSIS — I972 Postmastectomy lymphedema syndrome: Secondary | ICD-10-CM | POA: Insufficient documentation

## 2020-02-08 DIAGNOSIS — M25612 Stiffness of left shoulder, not elsewhere classified: Secondary | ICD-10-CM | POA: Insufficient documentation

## 2020-02-08 DIAGNOSIS — L905 Scar conditions and fibrosis of skin: Secondary | ICD-10-CM | POA: Insufficient documentation

## 2020-02-08 DIAGNOSIS — M25611 Stiffness of right shoulder, not elsewhere classified: Secondary | ICD-10-CM | POA: Insufficient documentation

## 2020-02-08 NOTE — Patient Instructions (Signed)
Bandages done - L UE   HEP for pt and son ed and precautions for bandaging ROM  for fisting , wrist and elbow flexion , D1 and D2 pattern for shoulder  10 reps - 3-5 x day or when bandages tight

## 2020-02-08 NOTE — Therapy (Signed)
[facility] [address], [place], [address] Phone: [phone]   Fax:  [phone]  Occupational Therapy Treatment  Patient Details  Name: Erin Wagner MRN: [id number] Date of Birth: 04/28/1941 Referring Provider (OT): Dr Janese Banks   Encounter Date: 02/08/2020   OT End of Session - 02/08/20 1650    Visit Number 1    Number of Visits 10    Date for OT Re-Evaluation 03/21/20    OT Start Time 5329    OT Stop Time 1625    OT Time Calculation (min) 55 min    Activity Tolerance Patient tolerated treatment well    Behavior During Therapy [facility] for tasks assessed/performed           Past Medical History:  Diagnosis Date  . Cancer ([place])    breast  . Cataract   . Diabetes mellitus without complication ([place])   . Hyperlipidemia   . Hypertension   . Thyroid disease     Past Surgical History:  Procedure Laterality Date  . BREAST SURGERY    . EYE SURGERY      There were no vitals filed for this visit.   Subjective Assessment - 02/08/20 1640    Subjective  Had my surgery in 2017 for  bilateral breast CA- the swelling in arms started about 1 1/2 ago - did not come last year because of COVID - L arm feels tight and full and heavy, scar tight under arm -    Pertinent History Bilateral breast CA 2017 in [place], bilateral radical mastectomy - 3 positive out of 16 ln on L , 1/13 positive on R - Radiation more than 5 wks bilateral , now on tamoxifin sicne July 2029 - refer by Dr Janese Banks for lymphedema treatment    Patient Stated Goals to get swelling better and tightness under arms to my range of motion can be better    Currently in Pain? --   no number provided - more tightness than pain             [facility] OT Assessment - 02/08/20 0001      Assessment   Medical Diagnosis bilateral UE lymphedema L worse than R     Referring Provider (OT) Dr Janese Banks    Onset Date/Surgical Date 07/09/18    Hand Dominance Right       Precautions   Precaution Comments --   lymphedema      Home  Environment   Lives With Family      Prior Function   Leisure read, on IPAD and do somethings around the house            LYMPHEDEMA/ONCOLOGY QUESTIONNAIRE - 02/08/20 0001      Surgeries   Mastectomy Date --   2017   Radical Mastectomy Date --   2017   Number Lymph Nodes Removed --   R 13, L 16      Date Lymphedema/Swelling Started   Date --   per pt 1 1/2 yr ago     Treatment   Past Radiation Treatment --   2017   Current Hormone Treatment --   on tamoxifen since July 2019     What other symptoms do you have   Are you Having Heaviness or Tightness Yes    Is it Hard or Difficult finding clothes that fit Yes    Is there Decreased scar mobility Yes      Lymphedema Stage  Stage STAGE 2 SPONTANEOUSLY IRREVERSIBLE      Right Upper Extremity Lymphedema   15 cm Proximal to Olecranon Process 29.3 cm    10 cm Proximal to Olecranon Process 27.5 cm    Olecranon Process 24.2 cm    15 cm Proximal to Ulnar Styloid Process 23.7 cm    10 cm Proximal to Ulnar Styloid Process 20 cm    Just Proximal to Ulnar Styloid Process 16 cm    Across Hand at [employer] 19 cm    At [place] of 2nd Digit 6 cm    At [facility] of Thumb 6 cm      Left Upper Extremity Lymphedema   15 cm Proximal to Olecranon Process 30.5 cm    10 cm Proximal to Olecranon Process 29.5 cm    Olecranon Process 27 cm    15 cm Proximal to Ulnar Styloid Process 27.2 cm    10 cm Proximal to Ulnar Styloid Process 24 cm    Just Proximal to Ulnar Styloid Process 17.4 cm    Across Hand at [employer] 19 cm    At [facility] of 2nd Digit 6 cm    At [facility] of Thumb 6 cm             Eucerin lotionappliedon L UE isotoner glove and stockinet- soft protouch- Artiflex 10 cm for hand toforearm,and 15 cm for hand toElbow Artiflex 15  from wrist <>hand to axilla  With rosidal band at top Short stretch 6 cm wrist<>hand to forearm Short stretch 8 cm hand to  elbow And short stretch 10 cm for mid forearm to upper arm  Review precautions for bandageswith pt and son  HEP for AROMreview  Fisting, Wrist and elbow flexion , ext, and horizontal ABD and ADD for R UE  10 reps 3 x day - or when bandages feels tight              OT Education - 02/08/20 9892    Education Details findings and HEP    Person(s) Educated Patient    Methods Explanation;Demonstration;Tactile cues;Verbal cues;Handout    Comprehension Verbal cues required;Returned demonstration;Verbalized understanding               OT Long Term Goals - 02/08/20 1655      OT LONG TERM GOAL #1   Title Pt to tolerate bandaging well to decrease L UE circumference more than 2 cm in elbow and forearm and 1.5 cm in upper arm to get measured for compression garments    Baseline increase 2 cm upper arm, elbow 2.8 cm and forearm 4 cm in L; R UE increase but do not have baseline from prior to lymphedema    Time 4    Period Weeks    Status New    Target Date 03/07/20      OT LONG TERM GOAL #2   Title Pt and family to be independent in bandaging to maintain decongesting while waiting for compression garments    Baseline no knowledge    Time 3    Period Weeks    Status New    Target Date 02/29/20      OT LONG TERM GOAL #3   Title Pt to be independent in HEP to decrease scar adhesions and increase AROM in shoulders    Baseline no knowledge -radical bilateral mastectomy - pect and under arm tight- and AROM impaired over head    Time 4    Period Weeks  Status New    Target Date 03/07/20      OT LONG TERM GOAL #4   Title Pt to be independent in wearing correct compression garments to maintain her circumference in bilateral UE    Baseline no compression -and garments    Time 6    Period Weeks    Status New    Target Date 03/21/20                 Plan - 02/08/20 1651    Clinical Impression Statement Pt present at OT eval with s/p bilateral radical  mastectomy with 13-16 ln removed bilateral - and stage 2 lympheedeme on L worse than R - and scar tissue on chest wall , pect and under arm limiting her functional use of bilateral shoulder - report some itching on R forearm -L increase by 2 cm in upper arm, elbow 2.8 cm and forearm 4 cm - pt can benefit from CDT for bilateral lymphedema  - pt prefer family to translate for her    OT Occupational Profile and History Problem Focused Assessment - Including review of records relating to presenting problem    Occupational performance deficits (Please refer to evaluation for details): ADL's;IADL's    Body Structure / Function / Physical Skills ADL;Flexibility;ROM;UE functional use;Scar mobility;Edema;Pain;IADL    Rehab Potential Fair    Clinical Decision Making Limited treatment options, no task modification necessary    Comorbidities Affecting Occupational Performance: May have comorbidities impacting occupational performance   had lymphedema for about year and 1/2 , radical mastectomy - tight scar tissue          Patient will benefit from skilled therapeutic intervention in order to improve the following deficits and impairments:   Body Structure / Function / Physical Skills: ADL, Flexibility, ROM, UE functional use, Scar mobility, Edema, Pain, IADL       Visit Diagnosis: Postmastectomy lymphedema syndrome - Plan: Ot plan of care cert/re-cert  Scar condition and fibrosis of skin - Plan: Ot plan of care cert/re-cert  Stiffness of left shoulder, not elsewhere classified - Plan: Ot plan of care cert/re-cert  Stiffness of right shoulder, not elsewhere classified - Plan: Ot plan of care cert/re-cert    Problem List Patient Active Problem List   Diagnosis Date Noted  . Hypothyroidism 05/13/2019  . Essential tremor 05/13/2019  . Allergic conjunctivitis of both eyes 05/13/2019  . Lymphedema 12/13/2017  . History of cataract 09/09/2017  . Depression 09/09/2017  . Health care maintenance  06/27/2017  . History of breast cancer 06/27/2017  . Diabetes mellitus without complication ([place]) 55/97/4163  . Hyperlipidemia associated with type 2 diabetes mellitus ([place]) 06/27/2017  . GERD (gastroesophageal reflux disease) 06/27/2017  . Hypertension 06/27/2017    Rosalyn Gess OTR/L,CLT 02/08/2020, 5:20 PM  [facility] [address] [place], [address] Phone: [phone]   Fax:  [phone]  Name: Erin Wagner MRN: [id number] Date of Birth: Jun 10, 1941

## 2020-02-10 ENCOUNTER — Ambulatory Visit: Payer: Self-pay | Admitting: Occupational Therapy

## 2020-02-10 ENCOUNTER — Other Ambulatory Visit: Payer: Self-pay

## 2020-02-10 DIAGNOSIS — M25611 Stiffness of right shoulder, not elsewhere classified: Secondary | ICD-10-CM

## 2020-02-10 DIAGNOSIS — M25612 Stiffness of left shoulder, not elsewhere classified: Secondary | ICD-10-CM

## 2020-02-10 DIAGNOSIS — I972 Postmastectomy lymphedema syndrome: Secondary | ICD-10-CM

## 2020-02-10 DIAGNOSIS — L905 Scar conditions and fibrosis of skin: Secondary | ICD-10-CM

## 2020-02-10 NOTE — Therapy (Signed)
[facility] [address], [place], [address] Phone: [phone]   Fax:  [phone]  Occupational Therapy Treatment  Patient Details  Name: Erin Wagner MRN: [id number] Date of Birth: [birth year] Referring Provider (OT): Dr Janese Banks   Encounter Date: 02/10/2020   OT End of Session - 02/10/20 1644    Visit Number 2    Number of Visits 10    Date for OT Re-Evaluation 03/21/20    OT Start Time 3825    OT Stop Time 0539    OT Time Calculation (min) 64 min    Activity Tolerance Patient tolerated treatment well           Past Medical History:  Diagnosis Date   Cancer ([place])    breast   Cataract    Diabetes mellitus without complication ([place])    Hyperlipidemia    Hypertension    Thyroid disease     Past Surgical History:  Procedure Laterality Date   BREAST SURGERY     EYE SURGERY      There were no vitals filed for this visit.   Subjective Assessment - 02/10/20 1643    Subjective  Did okay - did had some itching but not bad- done exercises - scar still tight    Pertinent History Bilateral breast CA 2017 in [place], bilateral radical mastectomy - 3 positive out of 16 ln on L , 1/13 positive on R - Radiation more than 5 wks bilateral , now on tamoxifin sicne July 2029 - refer by Dr Janese Banks for lymphedema treatment    Patient Stated Goals to get swelling better and tightness under arms to my range of motion can be better    Currently in Pain? No/denies               LYMPHEDEMA/ONCOLOGY QUESTIONNAIRE - 02/10/20 0001      Right Upper Extremity Lymphedema   15 cm Proximal to Olecranon Process 30 cm    10 cm Proximal to Olecranon Process 27.8 cm    Olecranon Process 24.2 cm    15 cm Proximal to Ulnar Styloid Process 23.6 cm    10 cm Proximal to Ulnar Styloid Process 10 cm    Just Proximal to Ulnar Styloid Process 15.6 cm    Across Hand at [employer] 18.5 cm      Left Upper Extremity  Lymphedema   15 cm Proximal to Olecranon Process 30.5 cm    10 cm Proximal to Olecranon Process 30.8 cm    Olecranon Process 25.5 cm    15 cm Proximal to Ulnar Styloid Process 25.1 cm    10 cm Proximal to Ulnar Styloid Process 23 cm    Just Proximal to Ulnar Styloid Process 16.2 cm    Across Hand at [employer] 18 cm    At [facility] of 2nd Digit 5.7 cm    At [facility] of Thumb 5.8 cm           Pt arrive with bandages on L UE -and isotoner glove and soft tubigrip on R hand to elbow bandags looser and slip down little on L upper arm   Bandages removed and measurements taken -see flowsheet Pt wash her arm  Eucerin lotionappliedon L UE isotoner glove and stockinet- soft protouch- Artiflex 10 cm for hand toforearm,and 15 cm for hand toElbow Artiflex 15  from wrist <>hand to axilla  With rosidal band at top Short stretch 6 cm wrist<>hand to  forearm Short stretch 8 cm hand to elbow And short stretch 10 cm for mid forearm to upper arm tubigrip nr E on upper arm - topper to keep bandages in place  Review precautions for bandageswith pt and son  Scar massage review with pt and son for mastectomy scars - done kinesiotape - 30% pull parallel ( horizontal 2)  And then across 3 at 100% pull - vertical over scar - facilitate scar mobs   AAROM review and done with pt - wall slides for shoulder flexion - bilateral 10 reps L shoulder ABD - on wall - slides 10 reps in supine pect stretch -   HEP for AROMreview  Fisting, Wrist and elbow flexion , ext, and horizontal ABD and ADD for R UE  10 reps 3 x day - or when bandages feels tight      R shoulder flexion 110 , ABD into flexion 98 L shoulder flexion 140 , ABD 135          OT Education - 02/10/20 1644    Education Details bandages, HEP for scar tissue and ROM    Person(s) Educated Patient    Methods Explanation;Demonstration;Tactile cues;Verbal cues;Handout    Comprehension Verbal cues required;Returned  demonstration;Verbalized understanding               OT Long Term Goals - 02/08/20 1655      OT LONG TERM GOAL #1   Title Pt to tolerate bandaging well to decrease L UE circumference more than 2 cm in elbow and forearm and 1.5 cm in upper arm to get measured for compression garments    Baseline increase 2 cm upper arm, elbow 2.8 cm and forearm 4 cm in L; R UE increase but do not have baseline from prior to lymphedema    Time 4    Period Weeks    Status New    Target Date 03/07/20      OT LONG TERM GOAL #2   Title Pt and family to be independent in bandaging to maintain decongesting while waiting for compression garments    Baseline no knowledge    Time 3    Period Weeks    Status New    Target Date 02/29/20      OT LONG TERM GOAL #3   Title Pt to be independent in HEP to decrease scar adhesions and increase AROM in shoulders    Baseline no knowledge -radical bilateral mastectomy - pect and under arm tight- and AROM impaired over head    Time 4    Period Weeks    Status New    Target Date 03/07/20      OT LONG TERM GOAL #4   Title Pt to be independent in wearing correct compression garments to maintain her circumference in bilateral UE    Baseline no compression -and garments    Time 6    Period Weeks    Status New    Target Date 03/21/20                 Plan - 02/10/20 1645    Clinical Impression Statement Pt tolerated bandage well on L UE - decrease in fingers to elbow - upper arm increased but bandages slide some  - initiate this date scar tissue massage , mobs using kinesiotape on chest- and AAROM for bilateral shoulder flexion , ABD on L -and pect stretches in supine - ed on precautions - per pt she wants family to interprets  OT Occupational Profile and History Problem Focused Assessment - Including review of records relating to presenting problem    Occupational performance deficits (Please refer to evaluation for details): ADL's;IADL's    Body  Structure / Function / Physical Skills ADL;Flexibility;ROM;UE functional use;Scar mobility;Edema;Pain;IADL    Rehab Potential Fair    Clinical Decision Making Limited treatment options, no task modification necessary    Comorbidities Affecting Occupational Performance: May have comorbidities impacting occupational performance    Modification or Assistance to Complete Evaluation  No modification of tasks or assist necessary to complete eval    OT Frequency 3x / week   2-3 wks , then 1 x wk   OT Duration --   5   OT Treatment/Interventions Self-care/ADL training;Therapeutic exercise;Manual lymph drainage;Compression bandaging;Scar mobilization;Manual Therapy;Passive range of motion    Plan assess progress and bandage - MLD, scar massage adn ROM    Consulted and Agree with Plan of Care Patient;Family member/caregiver           Patient will benefit from skilled therapeutic intervention in order to improve the following deficits and impairments:   Body Structure / Function / Physical Skills: ADL, Flexibility, ROM, UE functional use, Scar mobility, Edema, Pain, IADL       Visit Diagnosis: Postmastectomy lymphedema syndrome  Scar condition and fibrosis of skin  Stiffness of left shoulder, not elsewhere classified  Stiffness of right shoulder, not elsewhere classified    Problem List Patient Active Problem List   Diagnosis Date Noted   Hypothyroidism 05/13/2019   Essential tremor 05/13/2019   Allergic conjunctivitis of both eyes 05/13/2019   Lymphedema 12/13/2017   History of cataract 09/09/2017   Depression 09/09/2017   Health care maintenance 06/27/2017   History of breast cancer 06/27/2017   Diabetes mellitus without complication ([place]) 88/32/5498   Hyperlipidemia associated with type 2 diabetes mellitus ([place]) 06/27/2017   GERD (gastroesophageal reflux disease) 06/27/2017   Hypertension 06/27/2017    Rosalyn Gess OTR/L,CLT 02/10/2020, 4:49 PM  [facility] [address], [place], [address] Phone: [phone]   Fax:  [phone]  Name: Bethany Hirt MRN: [id number] Date of Birth: 01-Jul-1941

## 2020-02-12 ENCOUNTER — Other Ambulatory Visit: Payer: Self-pay

## 2020-02-12 ENCOUNTER — Ambulatory Visit: Payer: Self-pay | Admitting: Occupational Therapy

## 2020-02-12 DIAGNOSIS — I972 Postmastectomy lymphedema syndrome: Secondary | ICD-10-CM

## 2020-02-12 DIAGNOSIS — L905 Scar conditions and fibrosis of skin: Secondary | ICD-10-CM

## 2020-02-12 DIAGNOSIS — M25611 Stiffness of right shoulder, not elsewhere classified: Secondary | ICD-10-CM

## 2020-02-12 DIAGNOSIS — M25612 Stiffness of left shoulder, not elsewhere classified: Secondary | ICD-10-CM

## 2020-02-12 NOTE — Therapy (Signed)
[facility] [address] [place], [address] Phone: [phone]   Fax:  [phone]  Occupational Therapy Treatment  Patient Details  Name: Erin Wagner MRN: [id number] Date of Birth: 26-Nov-1940 Referring Provider (OT): Dr Janese Banks   Encounter Date: 02/12/2020   OT End of Session - 02/12/20 0953    Visit Number 3    Number of Visits 10    Date for OT Re-Evaluation 03/21/20    OT Start Time 0832    OT Stop Time 0935    OT Time Calculation (min) 63 min    Activity Tolerance Patient tolerated treatment well    Behavior During Therapy [facility] for tasks assessed/performed           Past Medical History:  Diagnosis Date  . Cancer ([place])    breast  . Cataract   . Diabetes mellitus without complication ([place])   . Hyperlipidemia   . Hypertension   . Thyroid disease     Past Surgical History:  Procedure Laterality Date  . BREAST SURGERY    . EYE SURGERY      There were no vitals filed for this visit.   Subjective Assessment - 02/12/20 0952    Subjective  My arm itched at the elbow - and tried to do the stretches on the wall - they are tough- and tiring    Pertinent History Bilateral breast CA 2017 in [place], bilateral radical mastectomy - 3 positive out of 16 ln on L , 1/13 positive on R - Radiation more than 5 wks bilateral , now on tamoxifin sicne July 2029 - refer by Dr Janese Banks for lymphedema treatment    Patient Stated Goals to get swelling better and tightness under arms to my range of motion can be better    Currently in Pain? No/denies               LYMPHEDEMA/ONCOLOGY QUESTIONNAIRE - 02/12/20 0001      Right Upper Extremity Lymphedema   15 cm Proximal to Olecranon Process 30.5 cm    10 cm Proximal to Olecranon Process 29 cm    Olecranon Process 25 cm      Left Upper Extremity Lymphedema   15 cm Proximal to Olecranon Process 30 cm    10 cm Proximal to Olecranon Process 29 cm    Olecranon  Process 25 cm    15 cm Proximal to Ulnar Styloid Process 25 cm    10 cm Proximal to Ulnar Styloid Process 22 cm    Just Proximal to Ulnar Styloid Process 16 cm    Across Hand at [employer] 18 cm    At [facility] of 2nd Digit 5.8 cm    At [facility] of Thumb 5.5 cm             Pt arrive with bandages on L UE -and isotoner glove and  tubigrip on R hand to elbow bandags looser at elbow where she report some itching   Bandages removed and measurements taken -see flowsheet Pt washed her arm  Eucerin lotionappliedonLUEand anti itch - anti histamine - family brought in to put on volar elbow  isotoner glove and stockinet- soft protouch- Artiflex 10 cm for hand toforearm,and 15 cm for hand toElbow Artiflex 35from wrist <>hand to axilla  With rosidal band at top New Short stretch 6 cm wrist<>hand to forearm New Short stretch 8 cm hand to elbow And new short stretch 10 cm for mid  forearm to upper arm tubigrip nr E on upper arm - topper to keep bandages in place  Review precautions for bandageswith pt and son  Scar massage review with pt and grand daughter for mastectomy scars - done kinesiotape  Star of  3 by 3 inches  at 100% pull  Over scar anterior pect on L  - facilitate scar mobs  Removed one from last time -and did not had any issues  AAROM review and done with pt again for  wall slides for shoulder flexion - bilateral 10 reps -but if fatigue can do less- but do not force- just slight pull  L shoulder ABD - on wall - slides 10 reps - need Mod A not to rotate into flexion in supine pect stretch to be done  -   HEP for AROMreview  Fisting, Wrist and elbow flexion , ext, and horizontal ABD and ADD for R UE  10 reps 3 x day - or when bandages feels tight  last time  R shoulder flexion 110 , ABD into flexion 98 L shoulder flexion 140 , ABD 135                  OT Education - 02/12/20 0952    Education Details bandages, HEP for scar tissue and  ROM    Person(s) Educated Patient;Other (comment)   grand daughter   Methods Explanation;Demonstration;Tactile cues;Verbal cues;Handout    Comprehension Verbal cues required;Returned demonstration;Verbalized understanding               OT Long Term Goals - 02/08/20 1655      OT LONG TERM GOAL #1   Title Pt to tolerate bandaging well to decrease L UE circumference more than 2 cm in elbow and forearm and 1.5 cm in upper arm to get measured for compression garments    Baseline increase 2 cm upper arm, elbow 2.8 cm and forearm 4 cm in L; R UE increase but do not have baseline from prior to lymphedema    Time 4    Period Weeks    Status New    Target Date 03/07/20      OT LONG TERM GOAL #2   Title Pt and family to be independent in bandaging to maintain decongesting while waiting for compression garments    Baseline no knowledge    Time 3    Period Weeks    Status New    Target Date 02/29/20      OT LONG TERM GOAL #3   Title Pt to be independent in HEP to decrease scar adhesions and increase AROM in shoulders    Baseline no knowledge -radical bilateral mastectomy - pect and under arm tight- and AROM impaired over head    Time 4    Period Weeks    Status New    Target Date 03/07/20      OT LONG TERM GOAL #4   Title Pt to be independent in wearing correct compression garments to maintain her circumference in bilateral UE    Baseline no compression -and garments    Time 6    Period Weeks    Status New    Target Date 03/21/20                 Plan - 02/12/20 0954    Clinical Impression Statement Pt cont to tolerate bandaging - but did had some itching at elbow -family brought some ointment to put on - and L UE cont  to decongest - but R uppper arm increased - pt fitted with tubigrip D from hand to axilla to wear as much as she can over isotoner glove on R - and L cont with bandages    OT Occupational Profile and History Problem Focused Assessment - Including review of  records relating to presenting problem    Occupational performance deficits (Please refer to evaluation for details): ADL's;IADL's    Body Structure / Function / Physical Skills ADL;Flexibility;ROM;UE functional use;Scar mobility;Edema;Pain;IADL    Rehab Potential Fair    Clinical Decision Making Limited treatment options, no task modification necessary    Comorbidities Affecting Occupational Performance: May have comorbidities impacting occupational performance    Modification or Assistance to Complete Evaluation  No modification of tasks or assist necessary to complete eval    OT Frequency 3x / week    OT Duration 4 weeks    OT Treatment/Interventions Self-care/ADL training;Therapeutic exercise;Manual lymph drainage;Compression bandaging;Scar mobilization;Manual Therapy;Passive range of motion    Plan assess progress and bandage - MLD, scar massage adn ROM    Consulted and Agree with Plan of Care Patient;Family member/caregiver           Patient will benefit from skilled therapeutic intervention in order to improve the following deficits and impairments:   Body Structure / Function / Physical Skills: ADL, Flexibility, ROM, UE functional use, Scar mobility, Edema, Pain, IADL       Visit Diagnosis: Scar condition and fibrosis of skin  Stiffness of left shoulder, not elsewhere classified  Stiffness of right shoulder, not elsewhere classified  Postmastectomy lymphedema syndrome    Problem List Patient Active Problem List   Diagnosis Date Noted  . Hypothyroidism 05/13/2019  . Essential tremor 05/13/2019  . Allergic conjunctivitis of both eyes 05/13/2019  . Lymphedema 12/13/2017  . History of cataract 09/09/2017  . Depression 09/09/2017  . Health care maintenance 06/27/2017  . History of breast cancer 06/27/2017  . Diabetes mellitus without complication ([place]) 94/76/5465  . Hyperlipidemia associated with type 2 diabetes mellitus ([place]) 06/27/2017  . GERD (gastroesophageal  reflux disease) 06/27/2017  . Hypertension 06/27/2017    Rosalyn Gess OTR/L,CLT 02/12/2020, 9:57 AM  [facility] [address] [place], [address] Phone: [phone]   Fax:  [phone]  Name: Erin Wagner MRN: [id number] Date of Birth: 07/23/1940

## 2020-02-15 ENCOUNTER — Ambulatory Visit: Payer: Self-pay | Admitting: Occupational Therapy

## 2020-02-15 ENCOUNTER — Other Ambulatory Visit: Payer: Self-pay

## 2020-02-15 DIAGNOSIS — M25612 Stiffness of left shoulder, not elsewhere classified: Secondary | ICD-10-CM

## 2020-02-15 DIAGNOSIS — I972 Postmastectomy lymphedema syndrome: Secondary | ICD-10-CM

## 2020-02-15 DIAGNOSIS — M25611 Stiffness of right shoulder, not elsewhere classified: Secondary | ICD-10-CM

## 2020-02-15 DIAGNOSIS — L905 Scar conditions and fibrosis of skin: Secondary | ICD-10-CM

## 2020-02-15 NOTE — Therapy (Signed)
[facility] [address], [place], [address] Phone: [phone]   Fax:  [phone]  Occupational Therapy Treatment  Patient Details  Name: Erin Wagner MRN: [id number] Date of Birth: 11-Jul-1940 Referring Provider (OT): Dr Janese Banks   Encounter Date: 02/15/2020   OT End of Session - 02/15/20 1653    Visit Number 4    Number of Visits 10    Date for OT Re-Evaluation 03/21/20    OT Start Time 1533    OT Stop Time 1641    OT Time Calculation (min) 68 min    Activity Tolerance Patient tolerated treatment well    Behavior During Therapy [facility] for tasks assessed/performed           Past Medical History:  Diagnosis Date  . Cancer ([place])    breast  . Cataract   . Diabetes mellitus without complication ([place])   . Hyperlipidemia   . Hypertension   . Thyroid disease     Past Surgical History:  Procedure Laterality Date  . BREAST SURGERY    . EYE SURGERY      There were no vitals filed for this visit.   Subjective Assessment - 02/15/20 1651    Subjective  Daughter in law helped her with her hair -and the top bandage got wet and had to redo it again - so come loose somewhat    Pertinent History Bilateral breast CA 2017 in [place], bilateral radical mastectomy - 3 positive out of 16 ln on L , 1/13 positive on R - Radiation more than 5 wks bilateral , now on tamoxifin sicne July 2029 - refer by Dr Janese Banks for lymphedema treatment    Patient Stated Goals to get swelling better and tightness under arms to my range of motion can be better    Currently in Pain? No/denies              [facility] OT Assessment - 02/15/20 0001      AROM   Right Shoulder Flexion 135 Degrees    Right Shoulder ABduction 110 Degrees    Left Shoulder Flexion 150 Degrees    Left Shoulder ABduction 150 Degrees           LYMPHEDEMA/ONCOLOGY QUESTIONNAIRE - 02/15/20 0001      Right Upper Extremity Lymphedema   15 cm Proximal to  Olecranon Process 30.3 cm    10 cm Proximal to Olecranon Process 27 cm    Olecranon Process 23.2 cm      Left Upper Extremity Lymphedema   15 cm Proximal to Olecranon Process 30.5 cm    10 cm Proximal to Olecranon Process 30 cm    Olecranon Process 25 cm    15 cm Proximal to Ulnar Styloid Process 25.5 cm    10 cm Proximal to Ulnar Styloid Process 22.5 cm    Just Proximal to Ulnar Styloid Process 16.4 cm    Across Hand at [employer] 18 cm    At [place] of 2nd Digit 6 cm    At [facility] of Thumb 5.5 cm             Pt arrive with bandages on L UE -and isotoner glove and tubigripD on R hand to axilla Top bandage looser and redone by D-I-L  On L upper arm   Bandages removed and measurements taken -see flowsheet Pt wash her arm  Eucerin lotionappliedonLUE isotoner glove and stockinet- soft protouch- Artiflex 10 cm for hand  toforearm,and 15 cm for hand toElbow Artiflex 57from wrist <>hand elbow With rosidal foam from wrist to axilla Short stretch 6 cm wrist<>hand to forearm Short stretch 8 cm hand to elbow And short stretch 10 cm for mid forearm to upper arm tubigrip nr E on upper arm - topper to keep bandages in place  Review precautions for bandageswith pt and son - an son ed on bandaging and hand out provided  Scar massage review with pt and son for mastectomy scars - done kinesiotape - 30% pull parallel on R scar  And then across 3 at 100% pull - vertical over scar - facilitate scar mobs  And done star over L scar on breast   AROM for shoulder assess - improving  - see flow sheet   AAROM review and done with pt - wall slides for shoulder flexion - bilateral 10 reps L shoulder ABD - on wall - slides 10 reps in supine pect stretch - importance for pect tightness   HEP for AROMreview  Fisting, Wrist and elbow flexion , ext, and horizontal ABD and ADD for R UE  10 reps 3 x day - or when bandages feels tight               OT Education -  02/15/20 1652    Education Details bandages, HEP for scar tissue and ROM; son ed on bandaging    Person(s) Educated Patient;Child(ren)    Methods Explanation;Demonstration;Tactile cues;Verbal cues;Handout    Comprehension Verbal cues required;Returned demonstration;Verbalized understanding               OT Long Term Goals - 02/08/20 1655      OT LONG TERM GOAL #1   Title Pt to tolerate bandaging well to decrease L UE circumference more than 2 cm in elbow and forearm and 1.5 cm in upper arm to get measured for compression garments    Baseline increase 2 cm upper arm, elbow 2.8 cm and forearm 4 cm in L; R UE increase but do not have baseline from prior to lymphedema    Time 4    Period Weeks    Status New    Target Date 03/07/20      OT LONG TERM GOAL #2   Title Pt and family to be independent in bandaging to maintain decongesting while waiting for compression garments    Baseline no knowledge    Time 3    Period Weeks    Status New    Target Date 02/29/20      OT LONG TERM GOAL #3   Title Pt to be independent in HEP to decrease scar adhesions and increase AROM in shoulders    Baseline no knowledge -radical bilateral mastectomy - pect and under arm tight- and AROM impaired over head    Time 4    Period Weeks    Status New    Target Date 03/07/20      OT LONG TERM GOAL #4   Title Pt to be independent in wearing correct compression garments to maintain her circumference in bilateral UE    Baseline no compression -and garments    Time 6    Period Weeks    Status New    Target Date 03/21/20                 Plan - 02/15/20 1653    Clinical Impression Statement Pt R upper arm decreased since last time with light compression and showed increase AROM  in bilateral shoulder -R shoulder AROM cont to be more impaired compare to L - L UE circumference did not decrease because of top bandage some loose over the weekend - famiily redone it - scar tissue improving    OT  Occupational Profile and History Problem Focused Assessment - Including review of records relating to presenting problem    Occupational performance deficits (Please refer to evaluation for details): ADL's;IADL's    Body Structure / Function / Physical Skills ADL;Flexibility;ROM;UE functional use;Scar mobility;Edema;Pain;IADL    Rehab Potential Fair    Clinical Decision Making Limited treatment options, no task modification necessary    Comorbidities Affecting Occupational Performance: May have comorbidities impacting occupational performance    Modification or Assistance to Complete Evaluation  No modification of tasks or assist necessary to complete eval    OT Frequency 3x / week    OT Duration --   3 wks   OT Treatment/Interventions Self-care/ADL training;Therapeutic exercise;Manual lymph drainage;Compression bandaging;Scar mobilization;Manual Therapy;Passive range of motion    Plan assess progress with  bandage - MLD, scar massage and ROM - ed son on bandaging    Consulted and Agree with Plan of Care Patient;Family member/caregiver           Patient will benefit from skilled therapeutic intervention in order to improve the following deficits and impairments:   Body Structure / Function / Physical Skills: ADL, Flexibility, ROM, UE functional use, Scar mobility, Edema, Pain, IADL       Visit Diagnosis: Scar condition and fibrosis of skin  Stiffness of left shoulder, not elsewhere classified  Stiffness of right shoulder, not elsewhere classified  Postmastectomy lymphedema syndrome    Problem List Patient Active Problem List   Diagnosis Date Noted  . Hypothyroidism 05/13/2019  . Essential tremor 05/13/2019  . Allergic conjunctivitis of both eyes 05/13/2019  . Lymphedema 12/13/2017  . History of cataract 09/09/2017  . Depression 09/09/2017  . Health care maintenance 06/27/2017  . History of breast cancer 06/27/2017  . Diabetes mellitus without complication ([place]) 99/35/7017   . Hyperlipidemia associated with type 2 diabetes mellitus ([place]) 06/27/2017  . GERD (gastroesophageal reflux disease) 06/27/2017  . Hypertension 06/27/2017    Rosalyn Gess OTR/l,CLT 02/15/2020, 4:58 PM  [facility] [address], [place], [address] Phone: [phone]   Fax:  [phone]  Name: Damiana Berrian MRN: [id number] Date of Birth: 20-Jul-1940

## 2020-02-17 ENCOUNTER — Other Ambulatory Visit: Payer: Self-pay

## 2020-02-17 ENCOUNTER — Ambulatory Visit: Payer: Self-pay | Admitting: Occupational Therapy

## 2020-02-17 DIAGNOSIS — L905 Scar conditions and fibrosis of skin: Secondary | ICD-10-CM

## 2020-02-17 DIAGNOSIS — M25612 Stiffness of left shoulder, not elsewhere classified: Secondary | ICD-10-CM

## 2020-02-17 DIAGNOSIS — I972 Postmastectomy lymphedema syndrome: Secondary | ICD-10-CM

## 2020-02-17 DIAGNOSIS — M25611 Stiffness of right shoulder, not elsewhere classified: Secondary | ICD-10-CM

## 2020-02-17 NOTE — Therapy (Signed)
[facility] [address], [place], [address] Phone: [phone]   Fax:  [phone]  Occupational Therapy Treatment  Patient Details  Name: Erin Wagner MRN: [id number] Date of Birth: June 02, 1941 Referring Provider (OT): Dr Janese Banks   Encounter Date: 02/17/2020   OT End of Session - 02/17/20 1642    Visit Number 5    Number of Visits 10    Date for OT Re-Evaluation 03/21/20    OT Start Time 1533    OT Stop Time 1638    OT Time Calculation (min) 65 min    Activity Tolerance Patient tolerated treatment well    Behavior During Therapy [facility] for tasks assessed/performed           Past Medical History:  Diagnosis Date  . Cancer ([place])    breast  . Cataract   . Diabetes mellitus without complication ([place])   . Hyperlipidemia   . Hypertension   . Thyroid disease     Past Surgical History:  Procedure Laterality Date  . BREAST SURGERY    . EYE SURGERY      There were no vitals filed for this visit.   Subjective Assessment - 02/17/20 1641    Subjective  Done okay - no itching -where do we need to go to get measured    Pertinent History Bilateral breast CA 2017 in [place], bilateral radical mastectomy - 3 positive out of 16 ln on L , 1/13 positive on R - Radiation more than 5 wks bilateral , now on tamoxifin sicne July 2029 - refer by Dr Janese Banks for lymphedema treatment    Patient Stated Goals to get swelling better and tightness under arms to my range of motion can be better    Currently in Pain? No/denies               LYMPHEDEMA/ONCOLOGY QUESTIONNAIRE - 02/17/20 0001      Right Upper Extremity Lymphedema   15 cm Proximal to Olecranon Process 30 cm    10 cm Proximal to Olecranon Process 27.4 cm    Olecranon Process 23 cm    15 cm Proximal to Ulnar Styloid Process 23.5 cm      Left Upper Extremity Lymphedema   15 cm Proximal to Olecranon Process 29.5 cm    10 cm Proximal to Olecranon Process  28.8 cm    Olecranon Process 24.8 cm    15 cm Proximal to Ulnar Styloid Process 25 cm    10 cm Proximal to Ulnar Styloid Process 21.8 cm    Just Proximal to Ulnar Styloid Process 16 cm    Across Hand at [employer] 17.8 cm    At [place] of 2nd Digit 5.8 cm    At [facility] 5.4 cm             Pt arrive with bandages on L UE -and isotoner glove and tubigripD on R hand to axilla   Bandages removed and measurements taken -see flowsheet Pt wash her arm Eucerin lotionappliedonLUE isotoner glove and stockinet- soft protouch- Artiflex 10 cm for hand toforearm,and 15 cm for hand toElbow Artiflex 52from wrist <>hand elbow With rosidal foam from wrist to axilla Short stretch 6 cm wrist<>hand to forearm Short stretch 8 cm hand to elbow And short stretch 10 cm for mid forearm to upper arm tubigrip nr E on upper arm - topper to keep bandages in place  Review precautions for bandageswith pt and son -  and son ed on bandaging and hand out provided Pt to change bandages after pt get measure for garments tomorrow afternoon  kinesiotape removed and Scar massage review with pt and son for mastectomy scars - Done mini massager this date on bilateral mastectomy scars   AAROM review and done with pt - with hands on table and flexion at trunk -and doing lateral too - L and R - for lat stretch  HEP for AROMreview lat time  Fisting, Wrist and elbow flexion , ext, and horizontal ABD and ADD for R UE  10 reps 3 x day - or when bandages feels tight  Getting measure for daytime Medi harmony and glove for R UE  L UE jobs Elverax soft sleeve and glove And tribute for night time for L UE                 OT Education - 02/17/20 1642    Education Details bandages, HEP for scar tissue and ROM; son ed on bandaging    Person(s) Educated Patient;Child(ren)    Methods Explanation;Demonstration;Tactile cues;Verbal cues;Handout    Comprehension Verbal cues  required;Returned demonstration;Verbalized understanding               OT Long Term Goals - 02/08/20 1655      OT LONG TERM GOAL #1   Title Pt to tolerate bandaging well to decrease L UE circumference more than 2 cm in elbow and forearm and 1.5 cm in upper arm to get measured for compression garments    Baseline increase 2 cm upper arm, elbow 2.8 cm and forearm 4 cm in L; R UE increase but do not have baseline from prior to lymphedema    Time 4    Period Weeks    Status New    Target Date 03/07/20      OT LONG TERM GOAL #2   Title Pt and family to be independent in bandaging to maintain decongesting while waiting for compression garments    Baseline no knowledge    Time 3    Period Weeks    Status New    Target Date 02/29/20      OT LONG TERM GOAL #3   Title Pt to be independent in HEP to decrease scar adhesions and increase AROM in shoulders    Baseline no knowledge -radical bilateral mastectomy - pect and under arm tight- and AROM impaired over head    Time 4    Period Weeks    Status New    Target Date 03/07/20      OT LONG TERM GOAL #4   Title Pt to be independent in wearing correct compression garments to maintain her circumference in bilateral UE    Baseline no compression -and garments    Time 6    Period Weeks    Status New    Target Date 03/21/20                 Plan - 02/17/20 1643    Clinical Impression Statement Pt done great decongesting L UE - decrease some more this past 2 days with switching to Rosidal foam - and using Tubigrip D with isotoner glove on R UE - pt to get measured for daytime compression sleeves for bilateral UE -and night time Tribute for L UE for night time - son to bandage at home 2 x wks and OT will assess if maintaining circumference while waiting for garments    OT Occupational Profile and  History Problem Focused Assessment - Including review of records relating to presenting problem    Occupational performance deficits  (Please refer to evaluation for details): ADL's;IADL's    Body Structure / Function / Physical Skills ADL;Flexibility;ROM;UE functional use;Scar mobility;Edema;Pain;IADL    Rehab Potential Fair    Clinical Decision Making Limited treatment options, no task modification necessary    Comorbidities Affecting Occupational Performance: May have comorbidities impacting occupational performance    Modification or Assistance to Complete Evaluation  No modification of tasks or assist necessary to complete eval    OT Frequency 1x / week    OT Duration 2 weeks    OT Treatment/Interventions Self-care/ADL training;Therapeutic exercise;Manual lymph drainage;Compression bandaging;Scar mobilization;Manual Therapy;Passive range of motion    Plan assess progress with  bandage - MLD, scar massage and ROM - ed son on bandaging    Consulted and Agree with Plan of Care Patient;Family member/caregiver           Patient will benefit from skilled therapeutic intervention in order to improve the following deficits and impairments:   Body Structure / Function / Physical Skills: ADL, Flexibility, ROM, UE functional use, Scar mobility, Edema, Pain, IADL       Visit Diagnosis: Scar condition and fibrosis of skin  Stiffness of left shoulder, not elsewhere classified  Stiffness of right shoulder, not elsewhere classified  Postmastectomy lymphedema syndrome    Problem List Patient Active Problem List   Diagnosis Date Noted  . Hypothyroidism 05/13/2019  . Essential tremor 05/13/2019  . Allergic conjunctivitis of both eyes 05/13/2019  . Lymphedema 12/13/2017  . History of cataract 09/09/2017  . Depression 09/09/2017  . Health care maintenance 06/27/2017  . History of breast cancer 06/27/2017  . Diabetes mellitus without complication ([place]) 57/97/2820  . Hyperlipidemia associated with type 2 diabetes mellitus ([place]) 06/27/2017  . GERD (gastroesophageal reflux disease) 06/27/2017  . Hypertension 06/27/2017     Rosalyn Gess OTR/L,CLT 02/17/2020, 4:58 PM  [facility] [address], [place], [address] Phone: [phone]   Fax:  [phone]  Name: Erin Wagner MRN: [id number] Date of Birth: 02-Feb-1941

## 2020-02-23 ENCOUNTER — Ambulatory Visit
Admission: RE | Admit: 2020-02-23 | Discharge: 2020-02-23 | Disposition: A | Discharge status: Home / Self Care | Payer: Self-pay | Source: Ambulatory Visit | Attending: Oncology | Admitting: Oncology

## 2020-02-23 ENCOUNTER — Ambulatory Visit: Payer: Self-pay | Admitting: Occupational Therapy

## 2020-02-23 ENCOUNTER — Other Ambulatory Visit: Payer: Self-pay

## 2020-02-23 DIAGNOSIS — L905 Scar conditions and fibrosis of skin: Secondary | ICD-10-CM

## 2020-02-23 DIAGNOSIS — Z08 Encounter for follow-up examination after completed treatment for malignant neoplasm: Secondary | ICD-10-CM | POA: Insufficient documentation

## 2020-02-23 DIAGNOSIS — M25611 Stiffness of right shoulder, not elsewhere classified: Secondary | ICD-10-CM

## 2020-02-23 DIAGNOSIS — Z79810 Long term (current) use of selective estrogen receptor modulators (SERMs): Secondary | ICD-10-CM | POA: Insufficient documentation

## 2020-02-23 DIAGNOSIS — Z5181 Encounter for therapeutic drug level monitoring: Secondary | ICD-10-CM | POA: Insufficient documentation

## 2020-02-23 DIAGNOSIS — Z853 Personal history of malignant neoplasm of breast: Secondary | ICD-10-CM | POA: Insufficient documentation

## 2020-02-23 DIAGNOSIS — I972 Postmastectomy lymphedema syndrome: Secondary | ICD-10-CM

## 2020-02-23 DIAGNOSIS — M25612 Stiffness of left shoulder, not elsewhere classified: Secondary | ICD-10-CM

## 2020-02-23 NOTE — Therapy (Signed)
[facility] [address] [place], [address] Phone: [phone]   Fax:  [phone]  Occupational Therapy Treatment  Patient Details  Name: Erin Wagner MRN: [id number] Date of Birth: [birth year] Referring Provider (OT): Dr Janese Banks   Encounter Date: 02/23/2020   OT End of Session - 02/23/20 1643    Visit Number 6    Number of Visits 10    Date for OT Re-Evaluation 03/21/20    OT Start Time 1535    OT Stop Time 1630    OT Time Calculation (min) 55 min    Activity Tolerance Patient tolerated treatment well    Behavior During Therapy [facility] for tasks assessed/performed           Past Medical History:  Diagnosis Date  . Cancer ([place])    breast  . Cataract   . Diabetes mellitus without complication ([place])   . Hyperlipidemia   . Hypertension   . Thyroid disease     Past Surgical History:  Procedure Laterality Date  . BREAST SURGERY    . EYE SURGERY      There were no vitals filed for this visit.   Subjective Assessment - 02/23/20 1643    Subjective  Done okay with bandages - she took it off about 2 hrs ago - R one is itching more than L - going now to pick up my over the counter sleeve for R    Pertinent History Bilateral breast CA 2017 in [place], bilateral radical mastectomy - 3 positive out of 16 ln on L , 1/13 positive on R - Radiation more than 5 wks bilateral , now on tamoxifin sicne July 2029 - refer by Dr Janese Banks for lymphedema treatment    Patient Stated Goals to get swelling better and tightness under arms to my range of motion can be better    Currently in Pain? No/denies               LYMPHEDEMA/ONCOLOGY QUESTIONNAIRE - 02/23/20 0001      Right Upper Extremity Lymphedema   15 cm Proximal to Olecranon Process 31.5 cm    10 cm Proximal to Olecranon Process 29.3 cm    Olecranon Process 23 cm    15 cm Proximal to Ulnar Styloid Process 23.6 cm    10 cm Proximal to Ulnar Styloid Process  20 cm    Just Proximal to Ulnar Styloid Process 16 cm    Across Hand at [employer] 18 cm    At [place] of 2nd Digit 6 cm    At [facility] 5.8 cm      Left Upper Extremity Lymphedema   10 cm Proximal to Olecranon Process 30 cm    Olecranon Process 25 cm    15 cm Proximal to Ulnar Styloid Process 25.8 cm    10 cm Proximal to Ulnar Styloid Process 22.2 cm    Just Proximal to Ulnar Styloid Process 16 cm    Across Hand at [employer] 17.7 cm    At [place] of 2nd Digit 5.7 cm    At [facility] 5.7 cm             Pt arrive with bandages off on L UE (per son took off about 2 hrs ago) -and Environmental manager and tubigrip Don R hand to mid upper arm   Measurements taken -see flowsheet Eucerin lotionappliedonLUE isotoner glove and stockinet- soft protouch- Artiflex 10 cm  for hand toforearm,and 15 cm for hand toElbow Artiflex 49from wrist <>handelbow With rosidalfoam from wrist to axilla Short stretch 6 cm wrist<>hand to forearm Short stretch 8 cm hand to elbow And short stretch 10 cm for mid forearm to upper arm tubigrip nr E on upper arm - topper to keep bandages in place  Review precautions for bandageswith pt and son- and son ed on bandaging and hand out  Pt to change bandages every 2 days until appt with me next week   Scar massage review with pt and son for mastectomy scars  And soft tissue over pect - with pt in supine and external rotation Should be able to relax into that position -   Pt to cont with AAROM with hands on table and flexion at trunk -and doing lateral too - L and R - for lat stretch  HEP for AROMreview lat time  Fisting, Wrist and elbow flexion , ext, and horizontal ABD and ADD for R UE  10 reps 3 x day - or when bandages feels tight  Getting fitted this afternoon with daytime Medi harmony and glove for R UE  Can wear during day -and if needed tubigrip D and glove night time   Measure and waiting for L UE jobs Elverax soft  sleeve and glove And tribute for night time for L UE                 OT Education - 02/23/20 1643    Education Details bandages, HEP for scar tissue and ROM; son ed on bandaging    Person(s) Educated Patient;Child(ren)    Methods Explanation;Demonstration;Tactile cues;Verbal cues;Handout    Comprehension Verbal cues required;Returned demonstration;Verbalized understanding               OT Long Term Goals - 02/08/20 1655      OT LONG TERM GOAL #1   Title Pt to tolerate bandaging well to decrease L UE circumference more than 2 cm in elbow and forearm and 1.5 cm in upper arm to get measured for compression garments    Baseline increase 2 cm upper arm, elbow 2.8 cm and forearm 4 cm in L; R UE increase but do not have baseline from prior to lymphedema    Time 4    Period Weeks    Status New    Target Date 03/07/20      OT LONG TERM GOAL #2   Title Pt and family to be independent in bandaging to maintain decongesting while waiting for compression garments    Baseline no knowledge    Time 3    Period Weeks    Status New    Target Date 02/29/20      OT LONG TERM GOAL #3   Title Pt to be independent in HEP to decrease scar adhesions and increase AROM in shoulders    Baseline no knowledge -radical bilateral mastectomy - pect and under arm tight- and AROM impaired over head    Time 4    Period Weeks    Status New    Target Date 03/07/20      OT LONG TERM GOAL #4   Title Pt to be independent in wearing correct compression garments to maintain her circumference in bilateral UE    Baseline no compression -and garments    Time 6    Period Weeks    Status New    Target Date 03/21/20  Plan - 02/23/20 1644    Clinical Impression Statement Pt L UE maintain it circumference - did increase forearm to upper arm -but not more than 1 cm - son ed on bandaging again -and to wear over the counter sleeve on R and night time isotoner glove and tubigrip if  needed- bandages until custom sleeves come in for L    OT Occupational Profile and History Problem Focused Assessment - Including review of records relating to presenting problem    Occupational performance deficits (Please refer to evaluation for details): ADL's;IADL's    Body Structure / Function / Physical Skills ADL;Flexibility;ROM;UE functional use;Scar mobility;Edema;Pain;IADL    Rehab Potential Fair    Clinical Decision Making Limited treatment options, no task modification necessary    Comorbidities Affecting Occupational Performance: May have comorbidities impacting occupational performance    Modification or Assistance to Complete Evaluation  No modification of tasks or assist necessary to complete eval    OT Frequency 1x / week    OT Duration 2 weeks    OT Treatment/Interventions Self-care/ADL training;Therapeutic exercise;Manual lymph drainage;Compression bandaging;Scar mobilization;Manual Therapy;Passive range of motion    Plan assess progress with  bandage - MLD, scar massage and ROM - ed son on bandaging    Consulted and Agree with Plan of Care Patient;Family member/caregiver           Patient will benefit from skilled therapeutic intervention in order to improve the following deficits and impairments:   Body Structure / Function / Physical Skills: ADL, Flexibility, ROM, UE functional use, Scar mobility, Edema, Pain, IADL       Visit Diagnosis: Stiffness of left shoulder, not elsewhere classified  Stiffness of right shoulder, not elsewhere classified  Postmastectomy lymphedema syndrome  Scar condition and fibrosis of skin    Problem List Patient Active Problem List   Diagnosis Date Noted  . Hypothyroidism 05/13/2019  . Essential tremor 05/13/2019  . Allergic conjunctivitis of both eyes 05/13/2019  . Lymphedema 12/13/2017  . History of cataract 09/09/2017  . Depression 09/09/2017  . Health care maintenance 06/27/2017  . History of breast cancer 06/27/2017   . Diabetes mellitus without complication ([place]) 88/91/6945  . Hyperlipidemia associated with type 2 diabetes mellitus ([place]) 06/27/2017  . GERD (gastroesophageal reflux disease) 06/27/2017  . Hypertension 06/27/2017    Rosalyn Gess OTR/L,CLT 02/23/2020, 4:48 PM  [facility] [address], [place], [address] Phone: [phone]   Fax:  [phone]  Name: Quintara Bost MRN: [id number] Date of Birth: [birth year]

## 2020-03-01 ENCOUNTER — Other Ambulatory Visit: Payer: Self-pay

## 2020-03-01 ENCOUNTER — Ambulatory Visit: Payer: Self-pay | Admitting: Occupational Therapy

## 2020-03-01 DIAGNOSIS — M25612 Stiffness of left shoulder, not elsewhere classified: Secondary | ICD-10-CM

## 2020-03-01 DIAGNOSIS — I972 Postmastectomy lymphedema syndrome: Secondary | ICD-10-CM

## 2020-03-01 DIAGNOSIS — M25611 Stiffness of right shoulder, not elsewhere classified: Secondary | ICD-10-CM

## 2020-03-01 DIAGNOSIS — L905 Scar conditions and fibrosis of skin: Secondary | ICD-10-CM

## 2020-03-01 NOTE — Therapy (Signed)
[facility] [address], [place], [address] Phone: [phone]   Fax:  [phone]  Occupational Therapy Treatment  Patient Details  Name: Erin Wagner MRN: [id number] Date of Birth: [birth year] Referring Provider (OT): Dr Janese Banks   Encounter Date: 03/01/2020   OT End of Session - 03/01/20 1700    Visit Number 7    Number of Visits 10    Date for OT Re-Evaluation 03/21/20    OT Start Time 1534    OT Stop Time 1624    OT Time Calculation (min) 50 min    Activity Tolerance Patient tolerated treatment well    Behavior During Therapy [facility] for tasks assessed/performed           Past Medical History:  Diagnosis Date  . Cancer ([place])    breast  . Cataract   . Diabetes mellitus without complication ([place])   . Hyperlipidemia   . Hypertension   . Thyroid disease     Past Surgical History:  Procedure Laterality Date  . BREAST SURGERY    . EYE SURGERY      There were no vitals filed for this visit.   Subjective Assessment - 03/01/20 1659    Subjective  We picked up the sleeve for the R arm and glove- and loves it - wearing it during day and nothing at night - did olay with bandages and exercises    Pertinent History Bilateral breast CA 2017 in [place], bilateral radical mastectomy - 3 positive out of 16 ln on L , 1/13 positive on R - Radiation more than 5 wks bilateral , now on tamoxifin sicne July 2029 - refer by Dr Janese Banks for lymphedema treatment    Patient Stated Goals to get swelling better and tightness under arms to my range of motion can be better    Currently in Pain? No/denies               LYMPHEDEMA/ONCOLOGY QUESTIONNAIRE - 03/01/20 0001      Right Upper Extremity Lymphedema   15 cm Proximal to Olecranon Process 30.3 cm (P)     10 cm Proximal to Olecranon Process 26.5 cm (P)     Olecranon Process 23 cm (P)     15 cm Proximal to Ulnar Styloid Process 23 cm (P)     10 cm Proximal to  Ulnar Styloid Process 19.8 cm (P)     Just Proximal to Ulnar Styloid Process 15.5 cm (P)     Across Hand at [employer] 18 cm (P)     At [place] of 2nd Digit 5.8 cm (P)     At [facility] of Thumb 5.8 cm (P)       Left Upper Extremity Lymphedema   10 cm Proximal to Olecranon Process 28.5 cm (P)     Olecranon Process 24.5 cm (P)     15 cm Proximal to Ulnar Styloid Process 24.6 cm (P)     10 cm Proximal to Ulnar Styloid Process 21.5 cm (P)     Just Proximal to Ulnar Styloid Process 15.5 cm (P)     Across Hand at [employer] 17.5 cm (P)     At [facility] of 2nd Digit 5.5 cm (P)     At [place] of Thumb 5.5 cm (P)              Pt arrive with bandages on L UE   And L UE - has her  medi harmony compression sleeve and glove on - not all the up on upper arm - pt ed on donning, doffing and wearing correctly  Fit well - causing no pain or sensory issues    Removed bandages on L UE  And measurements taken -see flowsheet Pt washed L arm Eucerin lotionappliedonLUE isotoner glove and stockinet- soft protouch- Artiflex 10 cm for hand toforearm,and 15 cm for hand toElbow Artiflex 95from wrist <>handelbow With rosidalfoam from wrist to axilla Short stretch 6 cm wrist<>hand to forearm Short stretch 8 cm hand to elbow And short stretch 10 cm for mid forearm to upper arm tubigrip nr E on upper arm - topper to keep bandages in place  Son is bandaging in between my weekly appt - to maintain her L UE circumference while awaiting compression   Pt to change bandages every 2 days until appt with me next week  Scar massage done with pt - using mini massager for scar tissue on chest wall - L more adhere on chest and and R more in axilla - limiting AROM over head  Pt to cont with scar massage And soft tissue over pect - with pt in supine and external rotation Should be able to relax into that position -   Pt to cont with AAROM with hands on table and flexion at trunk -and doing lateral too  - L and R - for lat stretch HEP for AROMreview lat time Fisting, Wrist and elbow flexion , ext, and horizontal ABD and ADD for R UE  10 reps 3 x day - or when bandages feels tight   Measured and waiting for L UE Jobs Elverax soft sleeve and glove And tribute for night time for L UE                 OT Education - 03/01/20 1700    Education Details bandages, HEP for scar tissue and ROM; son ed on bandaging, donning and wearing of compression garments    Person(s) Educated Patient;Child(ren)    Methods Explanation;Demonstration;Tactile cues;Verbal cues;Handout    Comprehension Verbal cues required;Returned demonstration;Verbalized understanding               OT Long Term Goals - 02/08/20 1655      OT LONG TERM GOAL #1   Title Pt to tolerate bandaging well to decrease L UE circumference more than 2 cm in elbow and forearm and 1.5 cm in upper arm to get measured for compression garments    Baseline increase 2 cm upper arm, elbow 2.8 cm and forearm 4 cm in L; R UE increase but do not have baseline from prior to lymphedema    Time 4    Period Weeks    Status New    Target Date 03/07/20      OT LONG TERM GOAL #2   Title Pt and family to be independent in bandaging to maintain decongesting while waiting for compression garments    Baseline no knowledge    Time 3    Period Weeks    Status New    Target Date 02/29/20      OT LONG TERM GOAL #3   Title Pt to be independent in HEP to decrease scar adhesions and increase AROM in shoulders    Baseline no knowledge -radical bilateral mastectomy - pect and under arm tight- and AROM impaired over head    Time 4    Period Weeks    Status New    Target  Date 03/07/20      OT LONG TERM GOAL #4   Title Pt to be independent in wearing correct compression garments to maintain her circumference in bilateral UE    Baseline no compression -and garments    Time 6    Period Weeks    Status New    Target Date 03/21/20                   Plan - 03/01/20 1701    Clinical Impression Statement Pt is doing very well with wearing of R UE medi harmony compression sleeve and glove- maintaining  UE circumference - and wearing no compression at night time- son still doing her bandages at home for R UE - awaiting her custom compression sleeves for day time and night time - Report her shoulder motion is better and able to reach in cabinets , bathing and dressing independent    OT Occupational Profile and History Problem Focused Assessment - Including review of records relating to presenting problem    Occupational performance deficits (Please refer to evaluation for details): ADL's;IADL's    Body Structure / Function / Physical Skills ADL;Flexibility;ROM;UE functional use;Scar mobility;Edema;Pain;IADL    Rehab Potential Fair    Clinical Decision Making Limited treatment options, no task modification necessary    Comorbidities Affecting Occupational Performance: May have comorbidities impacting occupational performance    Modification or Assistance to Complete Evaluation  No modification of tasks or assist necessary to complete eval    OT Frequency 1x / week    OT Duration --   3wks   OT Treatment/Interventions Self-care/ADL training;Therapeutic exercise;Manual lymph drainage;Compression bandaging;Scar mobilization;Manual Therapy;Passive range of motion    Plan assess if maintaing her circumferencd at home while awaiting custome garments    Consulted and Agree with Plan of Care Patient;Family member/caregiver           Patient will benefit from skilled therapeutic intervention in order to improve the following deficits and impairments:   Body Structure / Function / Physical Skills: ADL, Flexibility, ROM, UE functional use, Scar mobility, Edema, Pain, IADL       Visit Diagnosis: Stiffness of left shoulder, not elsewhere classified  Stiffness of right shoulder, not elsewhere classified  Postmastectomy  lymphedema syndrome  Scar condition and fibrosis of skin    Problem List Patient Active Problem List   Diagnosis Date Noted  . Hypothyroidism 05/13/2019  . Essential tremor 05/13/2019  . Allergic conjunctivitis of both eyes 05/13/2019  . Lymphedema 12/13/2017  . History of cataract 09/09/2017  . Depression 09/09/2017  . Health care maintenance 06/27/2017  . History of breast cancer 06/27/2017  . Diabetes mellitus without complication ([place]) 19/37/9024  . Hyperlipidemia associated with type 2 diabetes mellitus ([place]) 06/27/2017  . GERD (gastroesophageal reflux disease) 06/27/2017  . Hypertension 06/27/2017    Rosalyn Gess OTR/L,CLT 03/01/2020, 5:04 PM  [facility] [address] [place], [address] Phone: [phone]   Fax:  [phone]  Name: Erin Wagner MRN: [id number] Date of Birth: April 16, 1941

## 2020-03-02 ENCOUNTER — Encounter: Payer: Self-pay | Admitting: Family Medicine

## 2020-03-03 MED ORDER — IBUPROFEN 400 MG PO TABS: 400.0000 mg | ORAL_TABLET | Freq: Four times a day (QID) | ORAL | 0 refills | Status: DC | PRN

## 2020-03-03 NOTE — Telephone Encounter (Signed)
mychart message sent

## 2020-03-03 NOTE — Telephone Encounter (Signed)
We can send Rx for ibuprofen 400mg  q8h prn (30 day supply, r0).  It could be an infection.  She'll need to be seen by [place] or a dentist to evaluate.  If they're able to see a dentist and afford it, that is probably a better option, as we are likely to recommend that anyways.

## 2020-03-07 ENCOUNTER — Other Ambulatory Visit: Payer: Self-pay

## 2020-03-07 ENCOUNTER — Ambulatory Visit: Payer: Self-pay | Admitting: Pharmacist

## 2020-03-07 DIAGNOSIS — Z79899 Other long term (current) drug therapy: Secondary | ICD-10-CM

## 2020-03-07 NOTE — Progress Notes (Signed)
Medication Management Clinic Visit Note  Patient: Erin Wagner MRN: [id number] Date of Birth: 1941-04-27 PCP: Erin Crews, MD   Erin Wagner 79 y.o. female, was contacted via telephone, for a medication therapy management review. Patient's son, Erin Wagner, was on the line to answer the questions and stated his mother was next to him. We started the call with the interpreter Erin Wagner) via the language line. The interpreter asked if he was still needed as the son answered all of the questions in [place]. The interpreter ended his session half-way through. Patient's son states that his mother is very happy and they have no issues or concerns at this time regarding her medication therapy.   There were no vitals taken for this visit.  Patient Information   Past Medical History:  Diagnosis Date  . Cancer ([place])    breast  . Cataract   . Diabetes mellitus without complication ([place])   . Hyperlipidemia   . Hypertension   . Thyroid disease       Past Surgical History:  Procedure Laterality Date  . BREAST SURGERY    . EYE SURGERY       Family History  Problem Relation Age of Onset  . Healthy Sister   . Healthy Brother   . Healthy Daughter   . Healthy Sister   . Healthy Daughter   . Healthy Daughter     New Diagnoses (since last visit):   Family Support: Good  Lifestyle Diet: Two meals per day. Drinks water, milk and tea. Good appetite.           Social History   Substance and Sexual Activity  Alcohol Use No      Social History   Tobacco Use  Smoking Status Never Smoker  Smokeless Tobacco Never Used      Health Maintenance  Topic Date Due  . Hepatitis C Screening  Never done  . PNA vac Low Risk Adult (1 of 2 - PCV13) Never done  . OPHTHALMOLOGY EXAM  10/10/2018  . INFLUENZA VACCINE  02/07/2020  . TETANUS/TDAP  06/17/2023 (Originally 06/06/1959)  . FOOT EXAM  05/12/2020  . HEMOGLOBIN A1C  05/13/2020  . URINE MICROALBUMIN   11/10/2020  . DEXA SCAN  Completed  . COVID-19 Vaccine  Completed    Health Maintenance/Date Completed  Last ED visit: none recent per EPIC Last Visit to PCP: 11-11-19 Next Visit to PCP: 05-16-20 Specialist Visit: 07-29-20 (Dr. Janese Wagner) Dental Exam: none recent Eye Exam: none recent Pelvic/PAP Exam: not assessed Mammogram: Hx of bilateral mastectomy DEXA: 02-23-20 Colonoscopy: no Flu Vaccine: 2020 Pneumonia Vaccine: ? COVID-19 Vaccine: 2021   Outpatient Encounter Medications as of 03/07/2020  Medication Sig  . atorvastatin (LIPITOR) 20 MG tablet Take 1 tablet (20 mg total) by mouth daily.  . Calcium Carbonate-Vitamin D (CALCIUM 600+D) 600-400 MG-UNIT tablet Take 1 tablet by mouth daily.   . famotidine (PEPCID) 20 MG tablet Take 1 tablet (20 mg total) by mouth 2 (two) times daily.  Marland Kitchen ibuprofen (ADVIL) 400 MG tablet Take 1 tablet (400 mg total) by mouth every 6 (six) hours as needed.  Marland Kitchen levothyroxine (SYNTHROID) 25 MCG tablet Take 1/2 tablet daily  . metFORMIN (GLUCOPHAGE) 1000 MG tablet Take 0.5 tablets (500 mg total) by mouth 2 (two) times daily with a meal.  . Multiple Vitamin (MULTIVITAMIN) tablet Take 1 tablet by mouth daily.  . propranolol (INDERAL) 20 MG tablet Take 1 tablet (20 mg total) by mouth 2 (two) times daily.  . sertraline (ZOLOFT)  25 MG tablet Take 1 tablet (25 mg total) by mouth daily.  . tamoxifen (NOLVADEX) 20 MG tablet Take 1 tablet (20 mg total) by mouth daily.   No facility-administered encounter medications on file as of 03/07/2020.    Assessment and Plan:  Adherence/Compliance:  Son reports not missing any doses. He continues to set up her medications with a pill box.   DM: metformin 500mg  Pt does not check BG at home, only when she goes to the provider's office.  BG on 11-11-19: 118 A1c on 11-11-19: 7.0%  HTN: propranolol 20 mg twice daily BP not assessed today.  Depression: sertraline 25 mg daily Son reports mood is good.  GERD: famotidine 20 mg  twice daily Son denies any issues with reflux.  Hyperlipidemia: atorvastatin 20 mg daily Labs 11-11-19: TC 130, TG 247, HDL 47, LDL 45  Hypothyroidism: levothyroxine 25 mcg - 1/2 tablet daily 11-11-19 TSH: 3.260  History of Breast Cancer: tamoxifen 20 mg daily Patient currently seeing Dr. Janese Wagner for management.  Return to clinic in 1 year.   Erin Wagner K. Dicky Doe, PharmD Medication Management Clinic [place] Operations Coordinator [phone]

## 2020-03-08 ENCOUNTER — Ambulatory Visit: Payer: Self-pay | Admitting: Occupational Therapy

## 2020-03-08 DIAGNOSIS — L905 Scar conditions and fibrosis of skin: Secondary | ICD-10-CM

## 2020-03-08 DIAGNOSIS — M25612 Stiffness of left shoulder, not elsewhere classified: Secondary | ICD-10-CM

## 2020-03-08 DIAGNOSIS — M25611 Stiffness of right shoulder, not elsewhere classified: Secondary | ICD-10-CM

## 2020-03-08 DIAGNOSIS — I972 Postmastectomy lymphedema syndrome: Secondary | ICD-10-CM

## 2020-03-08 NOTE — Therapy (Signed)
[facility] [address], [place], [address] Phone: [phone]   Fax:  [phone]  Occupational Therapy Treatment  Patient Details  Name: Erin Wagner MRN: [id number] Date of Birth: 11-16-1940 Referring Provider (OT): Dr Janese Banks   Encounter Date: 03/08/2020   OT End of Session - 03/08/20 1628    Visit Number 8    Number of Visits 10    Date for OT Re-Evaluation 03/21/20    OT Start Time 1534    OT Stop Time 1624    OT Time Calculation (min) 50 min    Activity Tolerance Patient tolerated treatment well    Behavior During Therapy [facility] for tasks assessed/performed           Past Medical History:  Diagnosis Date  . Cancer ([place])    breast  . Cataract   . Diabetes mellitus without complication ([place])   . Hyperlipidemia   . Hypertension   . Thyroid disease     Past Surgical History:  Procedure Laterality Date  . BREAST SURGERY    . EYE SURGERY      There were no vitals filed for this visit.   Subjective Assessment - 03/08/20 1627    Subjective  She loves the sleeve on the R arm -cannot wait for the other one to come in- the bandages getting heavy and tiring    Pertinent History Bilateral breast CA 2017 in [place], bilateral radical mastectomy - 3 positive out of 16 ln on L , 1/13 positive on R - Radiation more than 5 wks bilateral , now on tamoxifin sicne July 2029 - refer by Dr Janese Banks for lymphedema treatment    Patient Stated Goals to get swelling better and tightness under arms to my range of motion can be better    Currently in Pain? No/denies               LYMPHEDEMA/ONCOLOGY QUESTIONNAIRE - 03/08/20 0001      Right Upper Extremity Lymphedema   15 cm Proximal to Olecranon Process 29 cm    10 cm Proximal to Olecranon Process 26.2 cm    Olecranon Process 23 cm    15 cm Proximal to Ulnar Styloid Process 23 cm    10 cm Proximal to Ulnar Styloid Process 19.8 cm    Just Proximal to Ulnar  Styloid Process 15.6 cm    Across Hand at [employer] 18 cm    At [place] of 2nd Digit 5.8 cm    At [facility] of Thumb 6 cm      Left Upper Extremity Lymphedema   15 cm Proximal to Olecranon Process 29.8 cm    10 cm Proximal to Olecranon Process 29 cm    Olecranon Process 24.2 cm    15 cm Proximal to Ulnar Styloid Process 24.4 cm    10 cm Proximal to Ulnar Styloid Process 21.5 cm    Just Proximal to Ulnar Styloid Process 16 cm    Across Hand at [employer] 17.7 cm    At [place] of 2nd Digit 5.5 cm    At [facility] of Thumb 6 cm            Pt arrive with bandageson L UE  And on R  UE - has her medi harmony compression sleeve and glove on - not all the up on upper arm - pt ed on donning, doffing and wearing correctly - she has hard time pulling it  all the up with her bandages on L arm  Appear to be not as tight - advice them to wash sleeve 1 or 2 x wk - and will fit a little tighter  But containing R UE very well - measurements same Fit well - causing no pain or sensory issues    Removed bandages on L UE  And measurements taken -see flow sheet-  Pt washed L arm Eucerin lotionappliedonLUE isotoner glove and stockinet- soft protouch- Artiflex 10 cm for hand toforearm,and 15 cm for hand toElbow Artiflex 45from wrist <>handelbow With rosidalfoam from wrist to axilla Short stretch 6 cm wrist<>hand to forearm Short stretch 8 cm hand to elbow And short stretch 10 cm for mid forearm to upper arm tubigrip nr E on upper arm - topper to keep bandages in place  Son is bandaging in between my weekly appt - to maintain her L UE circumference while awaiting compression   Pt to change bandagesevery 2 days until appt with me next week - but his doing it only 1 x   demo and done with pt pect stretch in doorway - she did well and could tolerate it better than at [facility] - when we tried it  She can do it 2 x day 10 reps - gentle stretch over chest area  Pt to cont withAAROM  with hands on table and flexion at trunk -and doing lateral too - L and R - for lat stretch And cont with AAROM on wall for shoulder flexion and ABD While in bandages to do Fisting, Wrist and elbow flexion , ext, and horizontal ABD and ADD for R UE  10 reps 3 x day - or when bandages feels tight  Son to call me if they hear from Rep at D[facility]([employer])  about her garments - they told me today that garmentst should be in any day now    Measured and waiting forL UE Jobs Elverax soft sleeve and glove And tribute for night time for L UE                   OT Education - 03/08/20 1628    Education Details bandages, HEP for scar tissue and ROM; son ed on bandaging, donning and wearing of compression garments    Person(s) Educated Patient;Child(ren)    Methods Explanation;Demonstration;Tactile cues;Verbal cues;Handout    Comprehension Verbal cues required;Returned demonstration;Verbalized understanding               OT Long Term Goals - 02/08/20 1655      OT LONG TERM GOAL #1   Title Pt to tolerate bandaging well to decrease L UE circumference more than 2 cm in elbow and forearm and 1.5 cm in upper arm to get measured for compression garments    Baseline increase 2 cm upper arm, elbow 2.8 cm and forearm 4 cm in L; R UE increase but do not have baseline from prior to lymphedema    Time 4    Period Weeks    Status New    Target Date 03/07/20      OT LONG TERM GOAL #2   Title Pt and family to be independent in bandaging to maintain decongesting while waiting for compression garments    Baseline no knowledge    Time 3    Period Weeks    Status New    Target Date 02/29/20      OT LONG TERM GOAL #3   Title Pt to be  independent in HEP to decrease scar adhesions and increase AROM in shoulders    Baseline no knowledge -radical bilateral mastectomy - pect and under arm tight- and AROM impaired over head    Time 4    Period Weeks    Status New    Target Date  03/07/20      OT LONG TERM GOAL #4   Title Pt to be independent in wearing correct compression garments to maintain her circumference in bilateral UE    Baseline no compression -and garments    Time 6    Period Weeks    Status New    Target Date 03/21/20                 Plan - 03/08/20 1629    OT Occupational Profile and History Problem Focused Assessment - Including review of records relating to presenting problem    Occupational performance deficits (Please refer to evaluation for details): ADL's;IADL's    Body Structure / Function / Physical Skills ADL;Flexibility;ROM;UE functional use;Scar mobility;Edema;Pain;IADL    Rehab Potential Fair    Clinical Decision Making Limited treatment options, no task modification necessary    Comorbidities Affecting Occupational Performance: May have comorbidities impacting occupational performance    Modification or Assistance to Complete Evaluation  No modification of tasks or assist necessary to complete eval    OT Frequency 1x / week    OT Duration 2 weeks    OT Treatment/Interventions Self-care/ADL training;Therapeutic exercise;Manual lymph drainage;Compression bandaging;Scar mobilization;Manual Therapy;Passive range of motion    Plan assess if maintaing her circumferencd at home while awaiting custome garments    Consulted and Agree with Plan of Care Patient;Family member/caregiver           Patient will benefit from skilled therapeutic intervention in order to improve the following deficits and impairments:   Body Structure / Function / Physical Skills: ADL, Flexibility, ROM, UE functional use, Scar mobility, Edema, Pain, IADL       Visit Diagnosis: Stiffness of right shoulder, not elsewhere classified  Postmastectomy lymphedema syndrome  Scar condition and fibrosis of skin  Stiffness of left shoulder, not elsewhere classified    Problem List Patient Active Problem List   Diagnosis Date Noted  . Hypothyroidism  05/13/2019  . Essential tremor 05/13/2019  . Allergic conjunctivitis of both eyes 05/13/2019  . Lymphedema 12/13/2017  . History of cataract 09/09/2017  . Depression 09/09/2017  . Health care maintenance 06/27/2017  . History of breast cancer 06/27/2017  . Diabetes mellitus without complication ([place]) 06/27/7587  . Hyperlipidemia associated with type 2 diabetes mellitus ([place]) 06/27/2017  . GERD (gastroesophageal reflux disease) 06/27/2017  . Hypertension 06/27/2017    Rosalyn Gess OTR/l,CLT 03/08/2020, 4:32 PM  [facility] [address], [place], [address] Phone: [phone]   Fax:  [phone]  Name: Tiersa Dayley MRN: [id number] Date of Birth: 06-18-1941

## 2020-03-16 ENCOUNTER — Other Ambulatory Visit: Payer: Self-pay

## 2020-03-16 ENCOUNTER — Ambulatory Visit: Payer: Self-pay | Attending: Oncology | Admitting: Occupational Therapy

## 2020-03-16 DIAGNOSIS — M25612 Stiffness of left shoulder, not elsewhere classified: Secondary | ICD-10-CM | POA: Insufficient documentation

## 2020-03-16 DIAGNOSIS — L905 Scar conditions and fibrosis of skin: Secondary | ICD-10-CM | POA: Insufficient documentation

## 2020-03-16 DIAGNOSIS — I972 Postmastectomy lymphedema syndrome: Secondary | ICD-10-CM | POA: Insufficient documentation

## 2020-03-16 DIAGNOSIS — M25611 Stiffness of right shoulder, not elsewhere classified: Secondary | ICD-10-CM | POA: Insufficient documentation

## 2020-03-16 NOTE — Therapy (Signed)
[facility] [address] [place], [address] Phone: [phone]   Fax:  [phone]  Occupational Therapy Treatment  Patient Details  Name: Erin Wagner MRN: [id number] Date of Birth: Dec 24, 1940 Referring Provider (OT): Dr Janese Banks   Encounter Date: 03/16/2020   OT End of Session - 03/16/20 1627    Visit Number 9    Number of Visits 10    Date for OT Re-Evaluation 03/21/20    OT Start Time 1535    OT Stop Time 1625    OT Time Calculation (min) 50 min    Activity Tolerance Patient tolerated treatment well    Behavior During Therapy [facility] for tasks assessed/performed           Past Medical History:  Diagnosis Date  . Cancer ([place])    breast  . Cataract   . Diabetes mellitus without complication ([place])   . Hyperlipidemia   . Hypertension   . Thyroid disease     Past Surgical History:  Procedure Laterality Date  . BREAST SURGERY    . EYE SURGERY      There were no vitals filed for this visit.   Subjective Assessment - 03/16/20 1626    Subjective  SHe loves the day sleeves- I picked up the sleeves for the L arm Friday - she has been wearing it - but the night time one you need to show me - they did not show my at [facility] how to put it on    Pertinent History Bilateral breast CA 2017 in [place], bilateral radical mastectomy - 3 positive out of 16 ln on L , 1/13 positive on R - Radiation more than 5 wks bilateral , now on tamoxifin sicne July 2029 - refer by Dr Janese Banks for lymphedema treatment    Patient Stated Goals to get swelling better and tightness under arms to my range of motion can be better    Currently in Pain? No/denies               LYMPHEDEMA/ONCOLOGY QUESTIONNAIRE - 03/16/20 0001      Right Upper Extremity Lymphedema   15 cm Proximal to Olecranon Process 29 cm    10 cm Proximal to Olecranon Process 26 cm    Olecranon Process 22.5 cm    15 cm Proximal to Ulnar Styloid Process 22.6  cm    10 cm Proximal to Ulnar Styloid Process 19.8 cm    Just Proximal to Ulnar Styloid Process 15 cm    Across Hand at [employer] 18 cm    At [place] of 2nd Digit 5.8 cm    At [facility] of Thumb 6 cm      Left Upper Extremity Lymphedema   15 cm Proximal to Olecranon Process 29 cm    10 cm Proximal to Olecranon Process 28.8 cm    Olecranon Process 24.3 cm    15 cm Proximal to Ulnar Styloid Process 25 cm    10 cm Proximal to Ulnar Styloid Process 21.7 cm    Just Proximal to Ulnar Styloid Process 15.5 cm    Across Hand at [employer] 17.8 cm    At [place] of 2nd Digit 5.6 cm    At [facility] of Thumb 5.8 cm            Pt arrive with daytime compression sleeves and gloves on bilateral arms- Medi harmony and glove on R for about 3 wks and maintaining well  L UE Jobst Elvarex soft sleeve and glove -assess fit this date  Well fitting -and pt had in with seam on volar forearm on and pleads - on outside of elbow  Son and pt ed on wearing correctly - can get slippy gator online to increase ease of donning her compression  Night time Jubilee sleeve with power sleeve -fitting well  Pt and son needed education on donning , doffing and wearing - education done -  had power sleeve under garment on over the weekend  Was no ed on wearing - when he picked it up Friday at [facility]  Pt to wear it for about 3 wks and will reassess  If containing her lymphedema and independent in wearing  Will discharge                  OT Education - 03/16/20 1627    Education Details donning, doffing and wearing of compression garments    Person(s) Educated Patient;Child(ren)    Methods Explanation;Demonstration;Tactile cues;Verbal cues;Handout    Comprehension Verbal cues required;Returned demonstration;Verbalized understanding               OT Long Term Goals - 02/08/20 1655      OT LONG TERM GOAL #1   Title Pt to tolerate bandaging well to decrease L UE circumference more than 2 cm in elbow  and forearm and 1.5 cm in upper arm to get measured for compression garments    Baseline increase 2 cm upper arm, elbow 2.8 cm and forearm 4 cm in L; R UE increase but do not have baseline from prior to lymphedema    Time 4    Period Weeks    Status New    Target Date 03/07/20      OT LONG TERM GOAL #2   Title Pt and family to be independent in bandaging to maintain decongesting while waiting for compression garments    Baseline no knowledge    Time 3    Period Weeks    Status New    Target Date 02/29/20      OT LONG TERM GOAL #3   Title Pt to be independent in HEP to decrease scar adhesions and increase AROM in shoulders    Baseline no knowledge -radical bilateral mastectomy - pect and under arm tight- and AROM impaired over head    Time 4    Period Weeks    Status New    Target Date 03/07/20      OT LONG TERM GOAL #4   Title Pt to be independent in wearing correct compression garments to maintain her circumference in bilateral UE    Baseline no compression -and garments    Time 6    Period Weeks    Status New    Target Date 03/21/20                 Plan - 03/16/20 1627    Clinical Impression Statement Pt's day time compression on bilateral UE fit well, and night time garment for L UE fit well - pt and son needed education of donning, doffing and wearing of garments - Circumference in bilateral UE well contain - pt to wear compression garmenst for 3 wks and will reassess one more time    OT Occupational Profile and History Problem Focused Assessment - Including review of records relating to presenting problem    Occupational performance deficits (Please refer to evaluation for details): ADL's;IADL's    Body Structure /  Function / Physical Skills ADL;Flexibility;ROM;UE functional use;Scar mobility;Edema;Pain;IADL    Rehab Potential Fair    Clinical Decision Making Limited treatment options, no task modification necessary    Comorbidities Affecting Occupational  Performance: May have comorbidities impacting occupational performance    Modification or Assistance to Complete Evaluation  No modification of tasks or assist necessary to complete eval    OT Frequency --   3 wks   OT Duration 2 weeks    OT Treatment/Interventions Self-care/ADL training;Therapeutic exercise;Manual lymph drainage;Compression bandaging;Scar mobilization;Manual Therapy;Passive range of motion    Plan assess independency in HEP and weairng of compresion and containment    Consulted and Agree with Plan of Care Patient;Family member/caregiver           Patient will benefit from skilled therapeutic intervention in order to improve the following deficits and impairments:   Body Structure / Function / Physical Skills: ADL, Flexibility, ROM, UE functional use, Scar mobility, Edema, Pain, IADL       Visit Diagnosis: Stiffness of right shoulder, not elsewhere classified  Postmastectomy lymphedema syndrome  Scar condition and fibrosis of skin  Stiffness of left shoulder, not elsewhere classified    Problem List Patient Active Problem List   Diagnosis Date Noted  . Hypothyroidism 05/13/2019  . Essential tremor 05/13/2019  . Allergic conjunctivitis of both eyes 05/13/2019  . Lymphedema 12/13/2017  . History of cataract 09/09/2017  . Depression 09/09/2017  . Health care maintenance 06/27/2017  . History of breast cancer 06/27/2017  . Diabetes mellitus without complication ([place]) 86/16/8372  . Hyperlipidemia associated with type 2 diabetes mellitus ([place]) 06/27/2017  . GERD (gastroesophageal reflux disease) 06/27/2017  . Hypertension 06/27/2017    Rosalyn Gess 03/16/2020, 4:32 PM  [facility] [address], [place], [address] Phone: [phone]   Fax:  [phone]  Name: Herlinda Heady MRN: [id number] Date of Birth: 1941-05-11

## 2020-04-06 ENCOUNTER — Ambulatory Visit: Payer: Self-pay | Admitting: Occupational Therapy

## 2020-04-06 ENCOUNTER — Other Ambulatory Visit: Payer: Self-pay

## 2020-04-06 DIAGNOSIS — M25612 Stiffness of left shoulder, not elsewhere classified: Secondary | ICD-10-CM

## 2020-04-06 DIAGNOSIS — L905 Scar conditions and fibrosis of skin: Secondary | ICD-10-CM

## 2020-04-06 DIAGNOSIS — M25611 Stiffness of right shoulder, not elsewhere classified: Secondary | ICD-10-CM

## 2020-04-06 DIAGNOSIS — I972 Postmastectomy lymphedema syndrome: Secondary | ICD-10-CM

## 2020-04-06 NOTE — Therapy (Signed)
[facility] [address] [place], [address] Phone: [phone]   Fax:  [phone]  Occupational Therapy Discharge  Patient Details  Name: Erin Wagner MRN: [id number] Date of Birth: 08-May-1941 Referring Provider (OT): Dr Janese Banks   Encounter Date: 04/06/2020   OT End of Session - 04/06/20 2045    Visit Number 10    Number of Visits 10    Date for OT Re-Evaluation 04/06/20    OT Start Time 1535    OT Stop Time 1610    OT Time Calculation (min) 35 min    Activity Tolerance Patient tolerated treatment well    Behavior During Therapy [facility] for tasks assessed/performed           Past Medical History:  Diagnosis Date   Cancer ([place])    breast   Cataract    Diabetes mellitus without complication ([place])    Hyperlipidemia    Hypertension    Thyroid disease     Past Surgical History:  Procedure Laterality Date   BREAST SURGERY     EYE SURGERY      There were no vitals filed for this visit.   Subjective Assessment - 04/06/20 2044    Subjective  Wearing daytime compression sleeves every day and on the L arm the night time one - family helps put on the night time one    Pertinent History Bilateral breast CA 2017 in [place], bilateral radical mastectomy - 3 positive out of 16 ln on L , 1/13 positive on R - Radiation more than 5 wks bilateral , now on tamoxifin sicne July 2029 - refer by Dr Janese Banks for lymphedema treatment    Patient Stated Goals to get swelling better and tightness under arms to my range of motion can be better    Currently in Pain? No/denies               LYMPHEDEMA/ONCOLOGY QUESTIONNAIRE - 04/06/20 0001      Right Upper Extremity Lymphedema   15 cm Proximal to Olecranon Process 28.5 cm    10 cm Proximal to Olecranon Process 26 cm    Olecranon Process 22.5 cm    15 cm Proximal to Ulnar Styloid Process 23 cm    10 cm Proximal to Ulnar Styloid Process 20 cm    Just Proximal  to Ulnar Styloid Process 15 cm    Across Hand at [employer] 18 cm    At [place] of 2nd Digit 5.8 cm    At [facility] of Thumb 6 cm      Left Upper Extremity Lymphedema   15 cm Proximal to Olecranon Process 28.2 cm    10 cm Proximal to Olecranon Process 28 cm    Olecranon Process 23.7 cm    15 cm Proximal to Ulnar Styloid Process 24 cm    10 cm Proximal to Ulnar Styloid Process 20.8 cm    Just Proximal to Ulnar Styloid Process 15 cm    Across Hand at [employer] 17.8 cm    At [place] of 2nd Digit 5.5 cm    At [facility] of Thumb 5.8 cm             Pt arrive with daytime compression sleeves and gloves on bilateral arms- Medi harmony and glove on R L UE Jobst Elvarex soft sleeve and glove - Well fitting -Pt sleeve appear to not be pulled up all the way - reinforce again to pull  it up all the way - correct fit and to put on compression prior to her blouse she wears with her sari garment  Son and pt ed on wearing correctly -  And demo and practice how to use slippy gator increase ease of donning her compression  Night time Jubilee sleeve with power sleeve -fitting well last time -and son report no issues with it  Garments containing her lymphedema well  Pt report some soreness on L pect - pt not relaxing into pect stretch - but doing more of push up - demo and review with pt and son  Pt made great progress in shoulder AROM from [facility] - see notes - pt to cont with HEP for AROM and scar massage                OT Education - 04/06/20 2045    Education Details donning, doffing and wearing of compression garments    Person(s) Educated Patient;Child(ren)    Methods Explanation;Demonstration;Tactile cues;Verbal cues;Handout    Comprehension Verbal cues required;Returned demonstration;Verbalized understanding               OT Long Term Goals - 04/06/20 2048      OT LONG TERM GOAL #1   Title Pt to tolerate bandaging well to decrease L UE circumference more than 2 cm in elbow and  forearm and 1.5 cm in upper arm to get measured for compression garments    Status Achieved      OT LONG TERM GOAL #2   Title Pt and family to be independent in bandaging to maintain decongesting while waiting for compression garments    Status Achieved      OT LONG TERM GOAL #3   Title Pt to be independent in HEP to decrease scar adhesions and increase AROM in shoulders    Status Achieved      OT LONG TERM GOAL #4   Title Pt to be independent in wearing correct compression garments to maintain her circumference in bilateral UE    Status Achieved                 Plan - 04/06/20 2046    Clinical Impression Statement Pt R UE circumferene about same and L UE decrease from wrist to upper arm since 3 wks ago - doing well per son with daytime compression sleeves and glove - pt put them on herself - and family helps with night time compression sleeve on L UE - garments containing her lymphedema - can cont with homeprogram and return in about 7 months about replacements    OT Occupational Profile and History Problem Focused Assessment - Including review of records relating to presenting problem    Occupational performance deficits (Please refer to evaluation for details): ADL's;IADL's    Body Structure / Function / Physical Skills ADL;Flexibility;ROM;UE functional use;Scar mobility;Edema;Pain;IADL    Rehab Potential Fair    Clinical Decision Making Limited treatment options, no task modification necessary    Comorbidities Affecting Occupational Performance: May have comorbidities impacting occupational performance    Modification or Assistance to Complete Evaluation  No modification of tasks or assist necessary to complete eval    OT Treatment/Interventions Self-care/ADL training;Therapeutic exercise;Manual lymph drainage;Compression bandaging;Scar mobilization;Manual Therapy;Passive range of motion    Plan cont with HEP and weairng of compresion    Consulted and Agree with Plan of Care  Patient;Family member/caregiver           Patient will benefit from skilled therapeutic intervention  in order to improve the following deficits and impairments:   Body Structure / Function / Physical Skills: ADL, Flexibility, ROM, UE functional use, Scar mobility, Edema, Pain, IADL       Visit Diagnosis: Stiffness of right shoulder, not elsewhere classified  Postmastectomy lymphedema syndrome  Scar condition and fibrosis of skin  Stiffness of left shoulder, not elsewhere classified    Problem List Patient Active Problem List   Diagnosis Date Noted   Hypothyroidism 05/13/2019   Essential tremor 05/13/2019   Allergic conjunctivitis of both eyes 05/13/2019   Lymphedema 12/13/2017   History of cataract 09/09/2017   Depression 09/09/2017   Health care maintenance 06/27/2017   History of breast cancer 06/27/2017   Diabetes mellitus without complication ([place]) 83/72/9021   Hyperlipidemia associated with type 2 diabetes mellitus ([place]) 06/27/2017   GERD (gastroesophageal reflux disease) 06/27/2017   Hypertension 06/27/2017    Rosalyn Gess OTR/L,CLT 04/06/2020, 8:49 PM  [facility] [address], [place], [address] Phone: [phone]   Fax:  [phone]  Name: Mylynn Dinh MRN: [id number] Date of Birth: 06/16/1941

## 2020-04-08 ENCOUNTER — Other Ambulatory Visit: Payer: Self-pay

## 2020-05-13 ENCOUNTER — Ambulatory Visit: Payer: Self-pay | Admitting: Family Medicine

## 2020-05-16 ENCOUNTER — Ambulatory Visit (INDEPENDENT_AMBULATORY_CARE_PROVIDER_SITE_OTHER): Payer: Self-pay | Admitting: Family Medicine

## 2020-05-16 ENCOUNTER — Other Ambulatory Visit: Payer: Self-pay

## 2020-05-16 ENCOUNTER — Encounter: Payer: Self-pay | Admitting: Family Medicine

## 2020-05-16 VITALS — BP 115/67 | HR 83 | Temp 98.8°F | Resp 16 | Wt 111.3 lb

## 2020-05-16 DIAGNOSIS — E785 Hyperlipidemia, unspecified: Secondary | ICD-10-CM

## 2020-05-16 DIAGNOSIS — F341 Dysthymic disorder: Secondary | ICD-10-CM

## 2020-05-16 DIAGNOSIS — Z23 Encounter for immunization: Secondary | ICD-10-CM

## 2020-05-16 DIAGNOSIS — E039 Hypothyroidism, unspecified: Secondary | ICD-10-CM

## 2020-05-16 DIAGNOSIS — I152 Hypertension secondary to endocrine disorders: Secondary | ICD-10-CM

## 2020-05-16 DIAGNOSIS — E1159 Type 2 diabetes mellitus with other circulatory complications: Secondary | ICD-10-CM

## 2020-05-16 DIAGNOSIS — E1169 Type 2 diabetes mellitus with other specified complication: Secondary | ICD-10-CM

## 2020-05-16 LAB — POCT GLYCOSYLATED HEMOGLOBIN (HGB A1C)
Est. average glucose Bld gHb Est-mCnc: 148
Hemoglobin A1C: 6.8 % — AB (ref 4.0–5.6)

## 2020-05-16 NOTE — Assessment & Plan Note (Signed)
Previously well controlled Continue synthroid at current dose Check TSH  F/u 6 months

## 2020-05-16 NOTE — Assessment & Plan Note (Signed)
Well Controlled, 115/67 today Continue current medications

## 2020-05-16 NOTE — Assessment & Plan Note (Addendum)
Stable A1c 6.8 Continue Metformin  Foot Exam normal Agreed to Pneumovax On statin

## 2020-05-16 NOTE — Assessment & Plan Note (Signed)
Well controlled, mood is stable Continue low dose zoloft

## 2020-05-16 NOTE — Progress Notes (Signed)
Established patient visit   Patient: Erin Wagner   DOB: February 19, 1941   79 y.o. Female  MRN: [id number] Visit Date: 05/16/2020  Today's healthcare provider: Lavon Paganini, MD   Chief Complaint  Patient presents with  . Diabetes  . Hypertension   Subjective    HPI  Son is with her today acting as interpreter. They decline phone interpreter.   She is doing well. She has no complaints. She had some jaw pain for a few days that resolved well with ibuprofen. She reports that she is sleeping well and eating well, thought she is sometimes tired in the evening around 5 pm and will nap in her chair. She is fatigued after walking for about 10 minutes but feels no sob, chest pain or abdominal pain. Her mood is well she enjoys spending time with her family.   Social History   Tobacco Use  . Smoking status: Never Smoker  . Smokeless tobacco: Never Used  Vaping Use  . Vaping Use: Never used  Substance Use Topics  . Alcohol use: No  . Drug use: No       Medications: Outpatient Medications Prior to Visit  Medication Sig  . azithromycin (ZITHROMAX) 250 MG tablet Take by mouth daily.  Marland Kitchen atorvastatin (LIPITOR) 20 MG tablet Take 1 tablet (20 mg total) by mouth daily.  . Calcium Carbonate-Vitamin D (CALCIUM 600+D) 600-400 MG-UNIT tablet Take 1 tablet by mouth daily.   . famotidine (PEPCID) 20 MG tablet Take 1 tablet (20 mg total) by mouth 2 (two) times daily.  Marland Kitchen ibuprofen (ADVIL) 400 MG tablet Take 1 tablet (400 mg total) by mouth every 6 (six) hours as needed.  Marland Kitchen levothyroxine (SYNTHROID) 25 MCG tablet Take 1/2 tablet daily  . metFORMIN (GLUCOPHAGE) 1000 MG tablet Take 0.5 tablets (500 mg total) by mouth 2 (two) times daily with a meal.  . Multiple Vitamin (MULTIVITAMIN) tablet Take 1 tablet by mouth daily.  . propranolol (INDERAL) 20 MG tablet Take 1 tablet (20 mg total) by mouth 2 (two) times daily.  . sertraline (ZOLOFT) 25 MG tablet Take 1 tablet (25 mg total) by mouth  daily.  . tamoxifen (NOLVADEX) 20 MG tablet Take 1 tablet (20 mg total) by mouth daily.   No facility-administered medications prior to visit.    Review of Systems  Constitutional: Negative.   HENT: Negative.   Eyes: Negative.   Respiratory: Negative.   Cardiovascular: Negative.   Gastrointestinal: Negative.   Endocrine: Negative.   Genitourinary: Negative.   Musculoskeletal: Negative.   Skin: Negative.   Allergic/Immunologic: Negative.   Neurological: Negative.   Hematological: Negative.   Psychiatric/Behavioral: Negative.       Objective    BP 115/67 (BP Location: Left Arm, Patient Position: Sitting, Cuff Size: Normal)   Pulse 83   Temp 98.8 F (37.1 C) (Oral)   Resp 16   Wt 111 lb 4.8 oz (50.5 kg)   SpO2 100%   BMI 26.84 kg/m    Physical Exam   General: Well nourished, well appearing, in NAD Cards: RRR, no murmurs rubs or gallops Lungs: CTA bilaterally Extremities: Palpable lymphedema present on L anterior portion of the forearm and R medial thigh. Feet: Bilateral sensation to pinprick and light touch intact  Diabetic Foot Exam - Simple   Simple Foot Form Diabetic Foot exam was performed with the following findings: Yes 05/16/2020  4:33 PM  Visual Inspection No deformities, no ulcerations, no other skin breakdown bilaterally: Yes Sensation Testing Intact  to touch and monofilament testing bilaterally: Yes Pulse Check Posterior Tibialis and Dorsalis pulse intact bilaterally: Yes Comments      Results for orders placed or performed in visit on 05/16/20  POCT glycosylated hemoglobin (Hb A1C)  Result Value Ref Range   Hemoglobin A1C 6.8 (A) 4.0 - 5.6 %   Est. average glucose Bld gHb Est-mCnc 148     Assessment & Plan     Problem List Items Addressed This Visit      Cardiovascular and Mediastinum   Hypertension associated with diabetes ([place])    Well Controlled, 115/67 today Continue current medications      Relevant Orders   Comprehensive  metabolic panel   CBC w/Diff/Platelet     Endocrine   Diabetes mellitus ([place]) - Primary    Stable A1c 6.8 Continue Metformin  Foot Exam normal Agreed to Pneumovax On statin      Relevant Orders   POCT glycosylated hemoglobin (Hb A1C) (Completed)   Hyperlipidemia associated with type 2 diabetes mellitus ([facility])    Previously well controlled Continue statin Repeat FLP and CMP Goal LDL < 70      Relevant Orders   Comprehensive metabolic panel   Lipid panel   Hypothyroidism    Previously well controlled Continue synthroid at current dose Check TSH  F/u 6 months      Relevant Orders   TSH   CBC w/Diff/Platelet     Other   Depression    Well controlled, mood is stable Continue low dose zoloft       Other Visit Diagnoses    Need for 23-polyvalent pneumococcal polysaccharide vaccine       Relevant Orders   Pneumococcal polysaccharide vaccine 23-valent greater than or equal to 2yo subcutaneous/IM (Completed)       Return in about 6 months (around 11/13/2020) for chronic disease f/u.       Note was initiated by Stark Klein, MS3  Patient seen along with MS3 student [facility]. I personally evaluated this patient along with the student, and verified all aspects of the history, physical exam, and medical decision making as documented by the student. I agree with the student's documentation and have made all necessary edits.  [place] Shannahan, Dionne Bucy, MD, MPH [facility]

## 2020-05-17 ENCOUNTER — Encounter: Payer: Self-pay | Admitting: Family Medicine

## 2020-05-17 NOTE — Assessment & Plan Note (Signed)
Previously well controlled Continue statin Repeat FLP and CMP Goal LDL < 70 

## 2020-07-22 ENCOUNTER — Ambulatory Visit: Payer: Self-pay | Admitting: Oncology

## 2020-07-22 ENCOUNTER — Other Ambulatory Visit: Payer: Self-pay

## 2020-07-29 ENCOUNTER — Inpatient Hospital Stay: Payer: Self-pay | Attending: Oncology

## 2020-07-29 ENCOUNTER — Other Ambulatory Visit: Payer: Self-pay

## 2020-07-29 ENCOUNTER — Encounter: Payer: Self-pay | Admitting: Oncology

## 2020-07-29 ENCOUNTER — Inpatient Hospital Stay (HOSPITAL_BASED_OUTPATIENT_CLINIC_OR_DEPARTMENT_OTHER): Payer: Self-pay | Admitting: Oncology

## 2020-07-29 ENCOUNTER — Other Ambulatory Visit: Payer: Self-pay | Admitting: Oncology

## 2020-07-29 VITALS — BP 126/63 | HR 83 | Temp 97.8°F | Resp 18 | Wt 110.1 lb

## 2020-07-29 DIAGNOSIS — Z08 Encounter for follow-up examination after completed treatment for malignant neoplasm: Secondary | ICD-10-CM

## 2020-07-29 DIAGNOSIS — E785 Hyperlipidemia, unspecified: Secondary | ICD-10-CM | POA: Insufficient documentation

## 2020-07-29 DIAGNOSIS — Z853 Personal history of malignant neoplasm of breast: Secondary | ICD-10-CM

## 2020-07-29 DIAGNOSIS — C50912 Malignant neoplasm of unspecified site of left female breast: Secondary | ICD-10-CM | POA: Insufficient documentation

## 2020-07-29 DIAGNOSIS — M7989 Other specified soft tissue disorders: Secondary | ICD-10-CM | POA: Insufficient documentation

## 2020-07-29 DIAGNOSIS — E119 Type 2 diabetes mellitus without complications: Secondary | ICD-10-CM | POA: Insufficient documentation

## 2020-07-29 DIAGNOSIS — E079 Disorder of thyroid, unspecified: Secondary | ICD-10-CM | POA: Insufficient documentation

## 2020-07-29 DIAGNOSIS — I10 Essential (primary) hypertension: Secondary | ICD-10-CM | POA: Insufficient documentation

## 2020-07-29 DIAGNOSIS — I890 Lymphedema, not elsewhere classified: Secondary | ICD-10-CM | POA: Insufficient documentation

## 2020-07-29 DIAGNOSIS — C50911 Malignant neoplasm of unspecified site of right female breast: Secondary | ICD-10-CM | POA: Insufficient documentation

## 2020-07-29 DIAGNOSIS — Z79810 Long term (current) use of selective estrogen receptor modulators (SERMs): Secondary | ICD-10-CM

## 2020-07-29 DIAGNOSIS — Z5181 Encounter for therapeutic drug level monitoring: Secondary | ICD-10-CM

## 2020-07-29 DIAGNOSIS — Z79899 Other long term (current) drug therapy: Secondary | ICD-10-CM | POA: Insufficient documentation

## 2020-07-29 DIAGNOSIS — Z7984 Long term (current) use of oral hypoglycemic drugs: Secondary | ICD-10-CM | POA: Insufficient documentation

## 2020-07-29 LAB — COMPREHENSIVE METABOLIC PANEL
ALT: 21 U/L (ref 0–44)
AST: 26 U/L (ref 15–41)
Albumin: 3.7 g/dL (ref 3.5–5.0)
Alkaline Phosphatase: 37 U/L — ABNORMAL LOW (ref 38–126)
Anion gap: 6 (ref 5–15)
BUN: 15 mg/dL (ref 8–23)
CO2: 28 mmol/L (ref 22–32)
Calcium: 9.4 mg/dL (ref 8.9–10.3)
Chloride: 98 mmol/L (ref 98–111)
Creatinine, Ser: 0.96 mg/dL (ref 0.44–1.00)
GFR, Estimated: 60 mL/min — ABNORMAL LOW (ref >60)
Glucose, Bld: 149 mg/dL — ABNORMAL HIGH (ref 70–99)
Potassium: 4 mmol/L (ref 3.5–5.1)
Sodium: 132 mmol/L — ABNORMAL LOW (ref 135–145)
Total Bilirubin: <0.1 mg/dL — ABNORMAL LOW (ref 0.3–1.2)
Total Protein: 7.3 g/dL (ref 6.5–8.1)

## 2020-07-29 LAB — CBC WITH DIFFERENTIAL/PLATELET
Abs Immature Granulocytes: 0.04 10*3/uL (ref 0.00–0.07)
Basophils Absolute: 0.1 10*3/uL (ref 0.0–0.1)
Basophils Relative: 1 %
Eosinophils Absolute: 0.4 10*3/uL (ref 0.0–0.5)
Eosinophils Relative: 3 %
HCT: 34.3 % — ABNORMAL LOW (ref 36.0–46.0)
Hemoglobin: 11.2 g/dL — ABNORMAL LOW (ref 12.0–15.0)
Immature Granulocytes: 0 %
Lymphocytes Relative: 25 %
Lymphs Abs: 2.8 10*3/uL (ref 0.7–4.0)
MCH: 27.3 pg (ref 26.0–34.0)
MCHC: 32.7 g/dL (ref 30.0–36.0)
MCV: 83.7 fL (ref 80.0–100.0)
Monocytes Absolute: 1.3 10*3/uL — ABNORMAL HIGH (ref 0.1–1.0)
Monocytes Relative: 11 %
Neutro Abs: 6.9 10*3/uL (ref 1.7–7.7)
Neutrophils Relative %: 60 %
Platelets: 289 10*3/uL (ref 150–400)
RBC: 4.1 MIL/uL (ref 3.87–5.11)
RDW: 13.1 % (ref 11.5–15.5)
WBC: 11.4 10*3/uL — ABNORMAL HIGH (ref 4.0–10.5)
nRBC: 0 % (ref 0.0–0.2)

## 2020-07-29 MED ORDER — TAMOXIFEN CITRATE 20 MG PO TABS: 20.0000 mg | ORAL_TABLET | Freq: Every day | ORAL | 3 refills | Status: DC

## 2020-08-04 NOTE — Progress Notes (Signed)
Hematology/Oncology Consult note [facility]  Telephone:([phone] [phone]  Patient Care Team: Virginia Crews, MD as PCP - General (Family Medicine)   Name of the patient: Erin Wagner  [id number]  August 08, 1940   Date of visit: 08/04/20  Diagnosis- h/o b/l breast cancer in [place] currently on tamoxifen  Chief complaint/ Reason for visit-routine follow-up of breast cancer on tamoxifen  Heme/Onc history: Patient is a 80 year old Lang[place] speaking female from [place] with a past medical history significant for hypertension hyperlipidemia, diabetes and bilateral breast cancer.She is here with son and daughter in law. She has refused formal language interpretor and would like her family to interpret for her. Her family is comfortable speaking in Hindi with me and translating for her.  Her oncology history is as follows:  1.She was diagnosed with bilateral breast cancer in July 2017 in [place]. She initially received neoadjuvant chemotherapy with 3 cycles of epirubicin and cyclophosphamide. This was followed by bilateral modified radical mastectomy. I do not have the actual pathology report from [place]. 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 I am unable to decipher the written handwriting well. Appears to have 1 out of 13 lymph nodes positive with PNE positive, no LV I or perineural invasion. Left breast mass showed 6 cm mixed infiltrating ductal and lobular histology. Again handwritten notes mention 3 out of 16 lymph nodes positive. In some other page on theoutside report, the right breast cancer has been mentioned as T2 N0 in the left breast cancer has been mentioned as T4N0.  2.Following surgery she had 1 cycle of epirubicin and cyclophosphamide and 2 cycles of doxorubicin. She then had IMRT to her bilateral breasts over 5  weeks. She was then asked to take tamoxifen for 6 months only. There is no mention about ER PR and HER-2 status anywhere in the outside reports. Patient was asked to take tamoxifen for 6 months following radiation and then stop it.   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 Since definitive evidence of ER status was not obtainable,given history of bilateral node positive breast cancer and was to proceed with tamoxifen assuming that it was ER PR positive. Tamoxifen 20 mg started July 2019. Fosamax weekly started for osteoporosis. She also had CT chest abdomen and pelvis as well as bone scan which did not reveal any evidence of malignancy. Focally increased activity in the left second rib to the presence of subacute fracture.  Interval history-I have communicated with the patient in Hindi which the patient and her daughter-in-law understand and can communicate well.  Patient reports that her left upper extremity swelling has improved significantly after seeing occupational therapy and wearing the lymphedema sleeve.  Appetite and weight has remained stable.  She is tolerating tamoxifen well without any significant side effects.  ECOG PS- 1 Pain scale- 0   Review of systems- Review of Systems  Constitutional: Negative for chills, fever, malaise/fatigue and weight loss.  HENT: Negative for congestion, ear discharge and nosebleeds.   Eyes: Negative for blurred vision.  Respiratory: Negative for cough, hemoptysis, sputum production, shortness of breath and wheezing.   Cardiovascular: Negative for chest pain, palpitations, orthopnea and claudication.  Gastrointestinal: Negative for abdominal pain, blood in stool, constipation, diarrhea, heartburn, melena, nausea and  vomiting.  Genitourinary: Negative for dysuria, flank pain, frequency, hematuria and urgency.  Musculoskeletal: Negative for back pain, joint pain and myalgias.  Skin: Negative for rash.  Neurological: Negative for dizziness, tingling, focal weakness, seizures, weakness and headaches.  Endo/Heme/Allergies: Does not bruise/bleed easily.  Psychiatric/Behavioral: Negative for depression and suicidal ideas. The patient does not have insomnia.        No Known Allergies   Past Medical History:  Diagnosis Date  . Cancer ([place])    breast  . Cataract   . Diabetes mellitus without complication ([place])   . Hyperlipidemia   . Hypertension   . Thyroid disease      Past Surgical History:  Procedure Laterality Date  . BREAST SURGERY    . EYE SURGERY      Social History   Socioeconomic History  . Marital status: Widowed    Spouse name: Not on file  . Number of children: 4  . Years of education: Not on file  . Highest education level: Not on file  Occupational History    Employer: retired    Comment: homemaker  Tobacco Use  . Smoking status: Never Smoker  . Smokeless tobacco: Never Used  Vaping Use  . Vaping Use: Never used  Substance and Sexual Activity  . Alcohol use: No  . Drug use: No  . Sexual activity: Not on file  Other Topics Concern  . Not on file  Social History Narrative  . Not on file   Social Determinants of Health   Financial Resource Strain: Not on file  Food Insecurity: Not on file  Transportation Needs: Not on file  Physical Activity: Not on file  Stress: Not on file  Social Connections: Not on file  Intimate Partner Violence: Not on file    Family History  Problem Relation Age of Onset  . Healthy Sister   . Healthy Brother   . Healthy Daughter   . Healthy Sister   . Healthy Daughter   . Healthy Daughter      Current Outpatient Medications:  .  atorvastatin (LIPITOR) 20 MG tablet, Take 1 tablet (20 mg total) by mouth daily., Disp: 90  tablet, Rfl: 3 .  azithromycin (ZITHROMAX) 250 MG tablet, Take by mouth daily., Disp: , Rfl:  .  Calcium Carbonate-Vitamin D 600-400 MG-UNIT tablet, Take 1 tablet by mouth daily. , Disp: , Rfl:  .  famotidine (PEPCID) 20 MG tablet, Take 1 tablet (20 mg total) by mouth 2 (two) times daily., Disp: 180 tablet, Rfl: 3 .  ibuprofen (ADVIL) 400 MG tablet, Take 1 tablet (400 mg total) by mouth every 6 (six) hours as needed., Disp: 30 tablet, Rfl: 0 .  levothyroxine (SYNTHROID) 25 MCG tablet, Take 1/2 tablet daily, Disp: 45 tablet, Rfl: 3 .  metFORMIN (GLUCOPHAGE) 1000 MG tablet, Take 0.5 tablets (500 mg total) by mouth 2 (two) times daily with a meal., Disp: 90 tablet, Rfl: 3 .  Multiple Vitamin (MULTIVITAMIN) tablet, Take 1 tablet by mouth daily., Disp: , Rfl:  .  propranolol (INDERAL) 20 MG tablet, Take 1 tablet (20 mg total) by mouth 2 (two) times daily., Disp: 180 tablet, Rfl: 3 .  sertraline (ZOLOFT) 25 MG tablet, Take 1 tablet (25 mg total) by mouth daily., Disp: 90 tablet, Rfl: 3 .  tamoxifen (NOLVADEX) 20 MG tablet, Take 1 tablet (20 mg total) by mouth daily., Disp: 90 tablet, Rfl: 3  Physical exam:  Vitals:   07/29/20 1507  BP: 126/63  Pulse: 83  Resp: 18  Temp: 97.8 F (36.6 C)  TempSrc: Tympanic  SpO2: 99%  Weight: 110 lb 1.6 oz (49.9 kg)   Physical Exam Constitutional:      General: She is not in acute distress. Eyes:     Extraocular Movements: EOM normal.  Cardiovascular:     Rate and Rhythm: Normal rate and regular rhythm.     Heart sounds: Normal heart sounds.  Pulmonary:     Effort: Pulmonary effort is normal.     Breath sounds: Normal breath sounds.  Skin:    General: Skin is warm and dry.  Neurological:     Mental Status: She is alert and oriented to person, place, and time.   Breast exam: Patient is s/p bilateral mastectomy with a well-healed surgical scar.  No evidence of chest wall recurrence.  No palpable bilateral lymphadenopathy.  CMP Latest Ref Rng & Units  07/29/2020  Glucose 70 - 99 mg/dL 149(H)  BUN 8 - 23 mg/dL 15  Creatinine 0.44 - 1.00 mg/dL 0.96  Sodium 135 - 145 mmol/L 132(L)  Potassium 3.5 - 5.1 mmol/L 4.0  Chloride 98 - 111 mmol/L 98  CO2 22 - 32 mmol/L 28  Calcium 8.9 - 10.3 mg/dL 9.4  Total Protein 6.5 - 8.1 g/dL 7.3  Total Bilirubin 0.3 - 1.2 mg/dL <0.1(L)  Alkaline Phos 38 - 126 U/L 37(L)  AST 15 - 41 U/L 26  ALT 0 - 44 U/L 21   CBC Latest Ref Rng & Units 07/29/2020  WBC 4.0 - 10.5 K/uL 11.4(H)  Hemoglobin 12.0 - 15.0 g/dL 11.2(L)  Hematocrit 36.0 - 46.0 % 34.3(L)  Platelets 150 - 400 K/uL 289    Assessment and plan- Patient is a 80 y.o. female  with bilateral breast cancer (stage is unclear and ER PR and her 2 neu status is also unclear) s/p neoadjuvant chemotherapy followed bilateral mastectomy without reconstruction, adjuvant chemotherapy, RT and 6 months of tamoxifenin 2017.  Tamoxifen was restarted in July 2019 and this is a routine follow-up visit  Given that patient has bilateral breast cancer I would like her to take tamoxifen for 10 years ending in 2027 as long as she can tolerated well.  Patient reports doing well overall.  Appetite and weight have remained stable.  No recent illnesses or hospitalizations.  Left upper extremity lymphedema has improved after wearing the sleeve and seen occupational therapy.  I will plan to get a repeat bone density scan this year.  Patient does have baseline osteoporosis which we have been monitoring and therefore she is on tamoxifen not aromatase inhibitor.  I will see her back in 6 months   Visit Diagnosis 1. Encounter for monitoring tamoxifen therapy   2. Encounter for follow-up surveillance of breast cancer      Dr. Randa Evens, MD, MPH [facility] at [facility] [phone] 08/04/2020 12:13 PM

## 2020-08-08 ENCOUNTER — Encounter: Payer: Self-pay | Admitting: Family Medicine

## 2020-08-08 DIAGNOSIS — K219 Gastro-esophageal reflux disease without esophagitis: Secondary | ICD-10-CM

## 2020-08-09 ENCOUNTER — Other Ambulatory Visit: Payer: Self-pay | Admitting: Family Medicine

## 2020-08-10 ENCOUNTER — Other Ambulatory Visit: Payer: Self-pay | Admitting: Family Medicine

## 2020-08-10 MED ORDER — FAMOTIDINE 20 MG PO TABS: 20.0000 mg | ORAL_TABLET | Freq: Two times a day (BID) | ORAL | 3 refills | Status: DC

## 2020-08-10 MED ORDER — ATORVASTATIN CALCIUM 20 MG PO TABS
20.0000 mg | ORAL_TABLET | Freq: Every day | ORAL | 3 refills | Status: DC
Start: 2020-08-10 — End: 2020-08-10

## 2020-08-10 MED ORDER — TAMOXIFEN CITRATE 20 MG PO TABS
20.0000 mg | ORAL_TABLET | Freq: Every day | ORAL | 3 refills | Status: DC
Start: 2020-08-10 — End: 2020-08-10

## 2020-10-13 ENCOUNTER — Other Ambulatory Visit: Payer: Self-pay

## 2020-10-13 ENCOUNTER — Other Ambulatory Visit: Payer: Self-pay | Admitting: Family Medicine

## 2020-10-13 MED FILL — Sertraline HCl Tab 25 MG: ORAL | 30 days supply | Qty: 30 | Fill #0 | Status: AC

## 2020-10-13 MED FILL — Tamoxifen Citrate Tab 20 MG (Base Equivalent): ORAL | 30 days supply | Qty: 30 | Fill #0 | Status: AC

## 2020-10-13 MED FILL — Atorvastatin Calcium Tab 20 MG (Base Equivalent): ORAL | 90 days supply | Qty: 90 | Fill #0 | Status: AC

## 2020-10-14 ENCOUNTER — Other Ambulatory Visit: Payer: Self-pay

## 2020-10-14 MED ORDER — ATORVASTATIN CALCIUM 10 MG PO TABS
20.0000 mg | ORAL_TABLET | Freq: Every day | ORAL | 0 refills | Status: DC
  Filled 2020-10-14: qty 60, 30d supply, fill #0

## 2020-10-18 ENCOUNTER — Other Ambulatory Visit: Payer: Self-pay

## 2020-10-19 ENCOUNTER — Other Ambulatory Visit: Payer: Self-pay

## 2020-11-05 MED FILL — Sertraline HCl Tab 25 MG: ORAL | 30 days supply | Qty: 30 | Fill #1 | Status: AC

## 2020-11-05 MED FILL — Propranolol HCl Tab 20 MG: ORAL | 30 days supply | Qty: 60 | Fill #0 | Status: AC

## 2020-11-05 MED FILL — Atorvastatin Calcium Tab 20 MG (Base Equivalent): ORAL | 90 days supply | Qty: 90 | Fill #1 | Status: AC

## 2020-11-05 MED FILL — Famotidine Tab 20 MG: ORAL | 90 days supply | Qty: 180 | Fill #0 | Status: AC

## 2020-11-05 MED FILL — Tamoxifen Citrate Tab 20 MG (Base Equivalent): ORAL | 30 days supply | Qty: 30 | Fill #1 | Status: AC

## 2020-11-05 MED FILL — Levothyroxine Sodium Tab 25 MCG: ORAL | 90 days supply | Qty: 45 | Fill #0 | Status: CN

## 2020-11-05 MED FILL — Metformin HCl Tab 500 MG: ORAL | 90 days supply | Qty: 180 | Fill #0 | Status: AC

## 2020-11-07 ENCOUNTER — Other Ambulatory Visit: Payer: Self-pay

## 2020-11-07 ENCOUNTER — Other Ambulatory Visit: Payer: Self-pay | Admitting: Family Medicine

## 2020-11-08 ENCOUNTER — Other Ambulatory Visit: Payer: Self-pay

## 2020-11-09 ENCOUNTER — Other Ambulatory Visit: Payer: Self-pay

## 2020-11-09 ENCOUNTER — Other Ambulatory Visit: Payer: Self-pay | Admitting: Family Medicine

## 2020-11-09 MED ORDER — LEVOTHYROXINE SODIUM 25 MCG PO TABS
ORAL_TABLET | Freq: Every day | ORAL | 1 refills | Status: DC
  Filled 2020-11-09: qty 45, 90d supply, fill #0
  Filled 2020-11-13: qty 45, fill #0

## 2020-11-10 ENCOUNTER — Other Ambulatory Visit: Payer: Self-pay

## 2020-11-14 ENCOUNTER — Other Ambulatory Visit: Payer: Self-pay

## 2020-11-15 ENCOUNTER — Telehealth: Payer: Self-pay

## 2020-11-15 ENCOUNTER — Ambulatory Visit (INDEPENDENT_AMBULATORY_CARE_PROVIDER_SITE_OTHER): Payer: Self-pay | Admitting: Family Medicine

## 2020-11-15 ENCOUNTER — Encounter: Payer: Self-pay | Admitting: Family Medicine

## 2020-11-15 ENCOUNTER — Other Ambulatory Visit: Payer: Self-pay

## 2020-11-15 VITALS — BP 115/72 | HR 77 | Temp 97.7°F | Wt 108.0 lb

## 2020-11-15 DIAGNOSIS — E1169 Type 2 diabetes mellitus with other specified complication: Secondary | ICD-10-CM

## 2020-11-15 DIAGNOSIS — E039 Hypothyroidism, unspecified: Secondary | ICD-10-CM

## 2020-11-15 DIAGNOSIS — E1159 Type 2 diabetes mellitus with other circulatory complications: Secondary | ICD-10-CM

## 2020-11-15 DIAGNOSIS — I152 Hypertension secondary to endocrine disorders: Secondary | ICD-10-CM

## 2020-11-15 DIAGNOSIS — E785 Hyperlipidemia, unspecified: Secondary | ICD-10-CM

## 2020-11-15 LAB — POCT GLYCOSYLATED HEMOGLOBIN (HGB A1C): Hemoglobin A1C: 6.7 % — AB (ref 4.0–5.6)

## 2020-11-15 MED ORDER — LEVOTHYROXINE SODIUM 25 MCG PO TABS
ORAL_TABLET | Freq: Every day | ORAL | 1 refills | Status: DC
  Filled 2020-11-15: qty 45, 90d supply, fill #0
  Filled 2021-02-07: qty 45, 90d supply, fill #1

## 2020-11-15 NOTE — Assessment & Plan Note (Signed)
Well controlled Continue current medications Recheck metabolic panel F/u in 6 months  

## 2020-11-15 NOTE — Assessment & Plan Note (Signed)
Stable and well controlled Continue metformin On statin UTD on vaccines

## 2020-11-15 NOTE — Progress Notes (Signed)
Established patient visit   Patient: Erin Wagner   DOB: [birth year]   80 y.o. Female  MRN: [id number] Visit Date: 11/15/2020  Today's healthcare provider: Lavon Paganini, MD   Chief Complaint  Patient presents with  . Diabetes  . Hypothyroidism  . Hypertension   Subjective    Diabetes Pertinent negatives for hypoglycemia include no dizziness or headaches. Pertinent negatives for diabetes include no chest pain, no fatigue and no weakness.  Hypertension Pertinent negatives include no chest pain, headaches, neck pain, palpitations or shortness of breath.    Erin Wagner is accompanied by her son and he is translating for her. He says that she is doing well. She is tolerating her medications for hypertension and diabetes.  Arthritis  He says she is having some pain in her right knee due to arthritis. He says she will use topical ointments for pain relief.   Diabetes Mellitus Type II, Follow-up  Lab Results  Component Value Date   HGBA1C 6.8 (A) 05/16/2020   HGBA1C 7.0 (A) 11/11/2019   HGBA1C 6.8 (H) 05/13/2019   Wt Readings from Last 3 Encounters:  11/15/20 108 lb (49 kg)  07/29/20 110 lb 1.6 oz (49.9 kg)  05/16/20 111 lb 4.8 oz (50.5 kg)   Last seen for diabetes 6 months ago.  Management since then includes no changes. She reports excellent compliance with treatment. She is not having side effects.   Home blood sugar records: are not being checked  Episodes of hypoglycemia? No    Current insulin regiment: None Most Recent Eye Exam: Pt is due for an eye exam Current exercise: walking Current diet habits: in general, a "healthy" diet    Pertinent Labs: Lab Results  Component Value Date   CHOL 130 11/11/2019   HDL 47 11/11/2019   LDLCALC 45 11/11/2019   TRIG 247 (H) 11/11/2019   CHOLHDL 2.9 07/14/2018   Lab Results  Component Value Date   NA 132 (L) 07/29/2020   K 4.0 07/29/2020   CREATININE 0.96 07/29/2020   GFRNONAA 60 (L) 07/29/2020    GFRAA 57 (L) 01/18/2020   GLUCOSE 149 (H) 07/29/2020     --------------------------------------------------------------------------------------------------- Hypertension, follow-up  BP Readings from Last 3 Encounters:  11/15/20 115/72  07/29/20 126/63  05/16/20 115/67   Wt Readings from Last 3 Encounters:  11/15/20 108 lb (49 kg)  07/29/20 110 lb 1.6 oz (49.9 kg)  05/16/20 111 lb 4.8 oz (50.5 kg)     She was last seen for hypertension 6 months ago.  BP at that visit was 115/67. Management since that visit includes no changes.  She reports excellent compliance with treatment. She is not having side effects.  She is following a Regular diet. She is exercising. She does not smoke.  Use of agents associated with hypertension: none.   Outside blood pressures are normal at home. Symptoms: No chest pain No chest pressure  No palpitations No syncope  No dyspnea No orthopnea  No paroxysmal nocturnal dyspnea No lower extremity edema   Pertinent labs: Lab Results  Component Value Date   CHOL 130 11/11/2019   HDL 47 11/11/2019   LDLCALC 45 11/11/2019   TRIG 247 (H) 11/11/2019   CHOLHDL 2.9 07/14/2018   Lab Results  Component Value Date   NA 132 (L) 07/29/2020   K 4.0 07/29/2020   CREATININE 0.96 07/29/2020   GFRNONAA 60 (L) 07/29/2020   GFRAA 57 (L) 01/18/2020   GLUCOSE 149 (H) 07/29/2020  The ASCVD Risk score Mikey Bussing DC Jr., et al., 2013) failed to calculate for the following reasons:   The 2013 ASCVD risk score is only valid for ages 17 to 67   --------------------------------------------------------------------------------------------------- Hypothyroid, follow-up  Lab Results  Component Value Date   TSH 3.260 11/11/2019   TSH 2.990 05/13/2019   TSH 3.520 07/14/2018   Wt Readings from Last 3 Encounters:  11/15/20 108 lb (49 kg)  07/29/20 110 lb 1.6 oz (49.9 kg)  05/16/20 111 lb 4.8 oz (50.5 kg)    She was last seen for hypothyroid 6 months ago.   Management since that visit includes no changes. She reports excellent compliance with treatment. She is not having side effects.   Symptoms: No change in energy level No constipation  No diarrhea No heat / cold intolerance  No nervousness No palpitations  No weight changes    -----------------------------------------------------------------------------------------   Patient Active Problem List   Diagnosis Date Noted  . Hypothyroidism 05/13/2019  . Essential tremor 05/13/2019  . Allergic conjunctivitis of both eyes 05/13/2019  . Lymphedema 12/13/2017  . History of cataract 09/09/2017  . Depression 09/09/2017  . History of breast cancer 06/27/2017  . Diabetes mellitus ([place]) 06/27/2017  . Hyperlipidemia associated with type 2 diabetes mellitus ([place]) 06/27/2017  . GERD (gastroesophageal reflux disease) 06/27/2017  . Hypertension associated with diabetes ([place]) 06/27/2017   Social History   Tobacco Use  . Smoking status: Never Smoker  . Smokeless tobacco: Never Used  Vaping Use  . Vaping Use: Never used  Substance Use Topics  . Alcohol use: No  . Drug use: No   No Known Allergies     Medications: Outpatient Medications Prior to Visit  Medication Sig  . atorvastatin (LIPITOR) 20 MG tablet TAKE ONE TABLET BY MOUTH EVERY DAY  . Calcium Carbonate-Vitamin D 600-400 MG-UNIT tablet Take 1 tablet by mouth daily.   . famotidine (PEPCID) 20 MG tablet TAKE ONE TABLET BY MOUTH 2 TIMES A DAY  . ibuprofen (ADVIL) 400 MG tablet Take 1 tablet (400 mg total) by mouth every 6 (six) hours as needed.  . metFORMIN (GLUCOPHAGE) 500 MG tablet TAKE ONE TABLET BY MOUTH 2 TIMES A DAY WITH A MEAL  . Multiple Vitamin (MULTIVITAMIN) tablet Take 1 tablet by mouth daily.  . propranolol (INDERAL) 20 MG tablet TAKE ONE TABLET BY MOUTH 2 TIMES A DAY  . sertraline (ZOLOFT) 25 MG tablet TAKE ONE TABLET BY MOUTH EVERY DAY  . tamoxifen (NOLVADEX) 20 MG tablet TAKE ONE TABLET BY MOUTH EVERY DAY  .  [DISCONTINUED] levothyroxine (SYNTHROID) 25 MCG tablet TAKE 1/2 TABLET BY MOUTH ONCE DAILY.  . [DISCONTINUED] atorvastatin (LIPITOR) 10 MG tablet Take 2 tablets (20 mg total) by mouth daily.  . [DISCONTINUED] azithromycin (ZITHROMAX) 250 MG tablet Take by mouth daily.   No facility-administered medications prior to visit.    Review of Systems  Constitutional: Negative for chills, fatigue and fever.  HENT: Negative for ear pain, nosebleeds, sinus pressure, sinus pain and sore throat.   Eyes: Negative for pain.  Respiratory: Negative for cough, chest tightness, shortness of breath and wheezing.   Cardiovascular: Negative for chest pain, palpitations and leg swelling.  Gastrointestinal: Negative for abdominal pain, blood in stool, diarrhea, nausea and vomiting.  Genitourinary: Negative for flank pain, frequency, pelvic pain and urgency.  Musculoskeletal: Positive for arthralgias (right knee). Negative for back pain, joint swelling, neck pain and neck stiffness.  Neurological: Negative for dizziness, syncope, weakness, numbness and headaches.  Objective    BP 115/72 (BP Location: Left Arm, Patient Position: Sitting, Cuff Size: Normal)   Pulse 77   Temp 97.7 F (36.5 C) (Oral)   Wt 108 lb (49 kg)   BMI 26.04 kg/m  BP Readings from Last 3 Encounters:  11/15/20 115/72  07/29/20 126/63  05/16/20 115/67   Wt Readings from Last 3 Encounters:  11/15/20 108 lb (49 kg)  07/29/20 110 lb 1.6 oz (49.9 kg)  05/16/20 111 lb 4.8 oz (50.5 kg)      Physical Exam Vitals reviewed.  Constitutional:      General: She is not in acute distress.    Appearance: Normal appearance. She is well-developed. She is not diaphoretic.  HENT:     Head: Normocephalic and atraumatic.  Eyes:     General: No scleral icterus.    Conjunctiva/sclera: Conjunctivae normal.  Neck:     Thyroid: No thyromegaly.  Cardiovascular:     Rate and Rhythm: Normal rate and regular rhythm.     Pulses: Normal  pulses.     Heart sounds: Normal heart sounds. No murmur heard.   Pulmonary:     Effort: Pulmonary effort is normal. No respiratory distress.     Breath sounds: Normal breath sounds. No wheezing, rhonchi or rales.  Musculoskeletal:     Cervical back: Neck supple.     Right lower leg: No edema.     Left lower leg: No edema.  Lymphadenopathy:     Cervical: No cervical adenopathy.  Skin:    General: Skin is warm and dry.     Findings: No rash.  Neurological:     Mental Status: She is alert and oriented to person, place, and time. Mental status is at baseline.  Psychiatric:        Mood and Affect: Mood normal.        Behavior: Behavior normal.       No results found for any visits on 11/15/20.  Assessment & Plan     Problem List Items Addressed This Visit      Cardiovascular and Mediastinum   Hypertension associated with diabetes ([place])    Well controlled Continue current medications Recheck metabolic panel F/u in 6 months       Relevant Orders   Comprehensive metabolic panel     Endocrine   Diabetes mellitus ([place]) - Primary    Stable and well controlled Continue metformin On statin UTD on vaccines      Relevant Orders   POCT glycosylated hemoglobin (Hb A1C)   Hyperlipidemia associated with type 2 diabetes mellitus ([facility])    Previously well controlled Continue statin Repeat FLP and CMP Goal LDL < 70      Relevant Orders   Comprehensive metabolic panel   Lipid panel   Hypothyroidism    Previously well controlled Continue Synthroid at current dose  Recheck TSH and adjust Synthroid as indicated        Relevant Medications   levothyroxine (SYNTHROID) 25 MCG tablet   Other Relevant Orders   TSH       Return in about 6 months (around 05/18/2021) for chronic disease f/u.       I,Essence Turner,acting as a Education administrator for Lavon Paganini, MD.,have documented all relevant documentation on the behalf of Lavon Paganini, MD,as directed by  Lavon Paganini, MD while in the presence of Lavon Paganini, MD.  I, Lavon Paganini, MD, have reviewed all documentation for this visit. The documentation on 11/15/20 for the exam, diagnosis,  procedures, and orders are all accurate and complete.   Decie Verne, Dionne Bucy, MD, MPH [facility]

## 2020-11-15 NOTE — Assessment & Plan Note (Signed)
Previously well controlled Continue statin Repeat FLP and CMP Goal LDL < 70 

## 2020-11-15 NOTE — Telephone Encounter (Signed)
Ok to Advertising copywriter. Thanks for checking. Please let pharmacist know.

## 2020-11-15 NOTE — Telephone Encounter (Signed)
Copied from [place] [id number]. Topic: General - Inquiry >> Nov 09, 2020  3:45 PM Loma Boston wrote: levothyroxine (SYNTHROID) 25 MCG tablet Medication Date: 11/09/2020 Department: [place] Ordering/Authorizing: Virginia Crews, MD  Adam from [place] calling stating that he has this med but from a different manufacture, wants Dr B to affirm ok to [employer], same drug. Please call Adam,states new # call [phone] to confirm. [facility] [address] [place] [address] Phone: [phone] Fax: [phone]

## 2020-11-15 NOTE — Assessment & Plan Note (Signed)
Previously well controlled Continue Synthroid at current dose  Recheck TSH and adjust Synthroid as indicated   

## 2020-11-15 NOTE — Telephone Encounter (Signed)
Left detailed message for adam.

## 2020-11-16 ENCOUNTER — Other Ambulatory Visit: Payer: Self-pay

## 2020-11-16 LAB — COMPREHENSIVE METABOLIC PANEL
ALT: 18 IU/L (ref 0–32)
AST: 25 IU/L (ref 0–40)
Albumin/Globulin Ratio: 1.4 (ref 1.2–2.2)
Albumin: 4 g/dL (ref 3.7–4.7)
Alkaline Phosphatase: 44 IU/L (ref 44–121)
BUN/Creatinine Ratio: 15 (ref 12–28)
BUN: 15 mg/dL (ref 8–27)
Bilirubin Total: <0.2 mg/dL (ref 0.0–1.2)
CO2: 23 mmol/L (ref 20–29)
Calcium: 9.7 mg/dL (ref 8.7–10.3)
Chloride: 100 mmol/L (ref 96–106)
Creatinine, Ser: 0.98 mg/dL (ref 0.57–1.00)
Globulin, Total: 2.9 g/dL (ref 1.5–4.5)
Glucose: 105 mg/dL — ABNORMAL HIGH (ref 65–99)
Potassium: 4.8 mmol/L (ref 3.5–5.2)
Sodium: 136 mmol/L (ref 134–144)
Total Protein: 6.9 g/dL (ref 6.0–8.5)
eGFR: 58 mL/min/{1.73_m2} — ABNORMAL LOW (ref >59)

## 2020-11-16 LAB — LIPID PANEL
Chol/HDL Ratio: 2.8 ratio (ref 0.0–4.4)
Cholesterol, Total: 124 mg/dL (ref 100–199)
HDL: 45 mg/dL (ref >39)
LDL Chol Calc (NIH): 46 mg/dL (ref 0–99)
Triglycerides: 204 mg/dL — ABNORMAL HIGH (ref 0–149)
VLDL Cholesterol Cal: 33 mg/dL (ref 5–40)

## 2020-11-16 LAB — TSH: TSH: 4.77 u[IU]/mL — ABNORMAL HIGH (ref 0.450–4.500)

## 2020-11-18 NOTE — Progress Notes (Signed)
Since she was out of it, that explains it being elevated. We will continue current dose and recheck at next visit.

## 2020-12-08 ENCOUNTER — Encounter: Payer: Self-pay | Admitting: Family Medicine

## 2020-12-08 MED FILL — Propranolol HCl Tab 20 MG: ORAL | 30 days supply | Qty: 60 | Fill #1 | Status: AC

## 2020-12-08 MED FILL — Tamoxifen Citrate Tab 20 MG (Base Equivalent): ORAL | 30 days supply | Qty: 30 | Fill #2 | Status: AC

## 2020-12-09 ENCOUNTER — Other Ambulatory Visit: Payer: Self-pay

## 2020-12-09 MED ORDER — SERTRALINE HCL 25 MG PO TABS
ORAL_TABLET | Freq: Every day | ORAL | 1 refills | Status: DC
  Filled 2020-12-09: qty 30, 30d supply, fill #0
  Filled 2021-01-03 – 2021-01-10 (×2): qty 30, 30d supply, fill #1
  Filled 2021-02-07: qty 30, 30d supply, fill #2
  Filled 2021-03-07: qty 30, 30d supply, fill #3
  Filled 2021-04-07: qty 30, 30d supply, fill #4
  Filled 2021-05-05: qty 30, 30d supply, fill #5

## 2020-12-12 ENCOUNTER — Other Ambulatory Visit: Payer: Self-pay

## 2021-01-03 MED FILL — Propranolol HCl Tab 20 MG: ORAL | 30 days supply | Qty: 60 | Fill #2 | Status: CN

## 2021-01-03 MED FILL — Tamoxifen Citrate Tab 20 MG (Base Equivalent): ORAL | 30 days supply | Qty: 30 | Fill #3 | Status: CN

## 2021-01-04 ENCOUNTER — Telehealth: Payer: Self-pay | Admitting: Pharmacist

## 2021-01-04 ENCOUNTER — Other Ambulatory Visit: Payer: Self-pay

## 2021-01-04 NOTE — Telephone Encounter (Signed)
Patient approved for medication assistance at [facility] until 10/07/21, as long as eligibility criteria continues to be met.   Vonda Henderson [facility] Administrative Assistant 

## 2021-01-10 ENCOUNTER — Other Ambulatory Visit: Payer: Self-pay

## 2021-01-10 MED FILL — Tamoxifen Citrate Tab 20 MG (Base Equivalent): ORAL | 30 days supply | Qty: 30 | Fill #3 | Status: AC

## 2021-01-10 MED FILL — Propranolol HCl Tab 20 MG: ORAL | 30 days supply | Qty: 60 | Fill #2 | Status: AC

## 2021-01-12 ENCOUNTER — Other Ambulatory Visit: Payer: Self-pay

## 2021-02-03 ENCOUNTER — Inpatient Hospital Stay: Payer: Self-pay | Attending: Oncology | Admitting: Oncology

## 2021-02-03 DIAGNOSIS — C50911 Malignant neoplasm of unspecified site of right female breast: Secondary | ICD-10-CM | POA: Insufficient documentation

## 2021-02-03 DIAGNOSIS — E119 Type 2 diabetes mellitus without complications: Secondary | ICD-10-CM | POA: Insufficient documentation

## 2021-02-03 DIAGNOSIS — Z170 Estrogen receptor positive status [ER+]: Secondary | ICD-10-CM | POA: Insufficient documentation

## 2021-02-03 DIAGNOSIS — Z08 Encounter for follow-up examination after completed treatment for malignant neoplasm: Secondary | ICD-10-CM

## 2021-02-03 DIAGNOSIS — Z853 Personal history of malignant neoplasm of breast: Secondary | ICD-10-CM

## 2021-02-03 DIAGNOSIS — I10 Essential (primary) hypertension: Secondary | ICD-10-CM | POA: Insufficient documentation

## 2021-02-03 DIAGNOSIS — E785 Hyperlipidemia, unspecified: Secondary | ICD-10-CM | POA: Insufficient documentation

## 2021-02-03 DIAGNOSIS — C50912 Malignant neoplasm of unspecified site of left female breast: Secondary | ICD-10-CM | POA: Insufficient documentation

## 2021-02-03 DIAGNOSIS — I890 Lymphedema, not elsewhere classified: Secondary | ICD-10-CM | POA: Insufficient documentation

## 2021-02-03 DIAGNOSIS — Z5181 Encounter for therapeutic drug level monitoring: Secondary | ICD-10-CM

## 2021-02-03 DIAGNOSIS — M81 Age-related 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 fracture: Secondary | ICD-10-CM | POA: Insufficient documentation

## 2021-02-03 DIAGNOSIS — Z7984 Long term (current) use of oral hypoglycemic drugs: Secondary | ICD-10-CM | POA: Insufficient documentation

## 2021-02-03 DIAGNOSIS — Z79899 Other long term (current) drug therapy: Secondary | ICD-10-CM | POA: Insufficient documentation

## 2021-02-03 DIAGNOSIS — Z79810 Long term (current) use of selective estrogen receptor modulators (SERMs): Secondary | ICD-10-CM | POA: Insufficient documentation

## 2021-02-04 NOTE — Progress Notes (Signed)
Hematology/Oncology Consult note [facility]  Telephone:([phone] [phone]  Patient Care Team: Virginia Crews, MD as PCP - General (Family Medicine)   Name of the patient: Erin Wagner  [id number]  [birth year]   Date of visit: 02/04/21  Diagnosis- h/o b/l breast cancer in [place] currently on tamoxifen  Chief complaint/ Reason for visit- routine f/u of breast acncer on tamoxifen  Heme/Onc history: Patient is a 80 year old [place] speaking female from [place] with a past medical history significant for hypertension hyperlipidemia, diabetes and bilateral breast cancer.  She is here with son and daughter in law. She has refused formal language interpretor and would like her family to interpret for her. Her family is comfortable speaking in Hindi with me and translating for her.   Her oncology history is as follows:   1.  She was diagnosed with bilateral breast cancer in July 2017 in [place].  She initially received neoadjuvant chemotherapy with 3 cycles of epirubicin and cyclophosphamide.  This was followed by bilateral modified radical mastectomy.  I do not have the actual pathology report from [place].  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  I am unable to decipher the written handwriting well.  Appears to have 1 out of 13 lymph nodes positive with PNE positive, no LV I or perineural invasion.  Left breast mass showed 6 cm mixed infiltrating ductal and lobular histology.  Again handwritten notes mention 3 out of 16 lymph nodes positive.  In some other page on the outside report, the right breast cancer has been mentioned as T2 N0 in the left breast cancer has been mentioned as T4N0.   2.  Following surgery she had 1 cycle of epirubicin and cyclophosphamide and 2 cycles of doxorubicin.  She then had IMRT to her bilateral breasts over 5  weeks.  She was then asked to take tamoxifen for 6 months only.  There is no mention about ER PR and HER-2 status anywhere in the outside reports. Patient was asked to take tamoxifen for 6 months following radiation and then stop it.   3. 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  Patient had a bone density scan in July 2019 which showed significant osteoporosis.  Since definitive evidence of ER status was not obtainable, given history of bilateral node positive breast cancer and was to proceed with tamoxifen assuming that it was ER PR positive.  Tamoxifen 20 mg started July 2019.  Fosamax weekly started for osteoporosis.  She also had CT chest abdomen and pelvis as well as bone scan which did not reveal any evidence of malignancy.  Focally increased activity in the left second rib to the presence of subacute fracture.  Interval history-patient is here with her son today.  I communicate with them in Hindi.  She reports doing well overall.  States that she has greatly benefited from seeing Gwenette Greet from occupational therapy and her lymphedema especially in her left arm has improved.  She would like to follow-up with her again to get a new sleeve.  She is tolerating tamoxifen without any significant side effects.  She remains independent of her ADLs  ECOG PS- 1 Pain scale- 0   Review of systems- Review of Systems  Constitutional:  Negative for chills, fever, malaise/fatigue and weight loss.  HENT:  Negative for congestion, ear discharge and nosebleeds.   Eyes:  Negative for blurred vision.  Respiratory:  Negative for cough, hemoptysis, sputum production, shortness of breath and wheezing.   Cardiovascular:  Negative for chest pain, palpitations, orthopnea and claudication.  Gastrointestinal:  Negative for abdominal pain, blood in stool, constipation, diarrhea,  heartburn, melena, nausea and vomiting.  Genitourinary:  Negative for dysuria, flank pain, frequency, hematuria and urgency.  Musculoskeletal:  Negative for back pain, joint pain and myalgias.  Skin:  Negative for rash.  Neurological:  Negative for dizziness, tingling, focal weakness, seizures, weakness and headaches.  Endo/Heme/Allergies:  Does not bruise/bleed easily.  Psychiatric/Behavioral:  Negative for depression and suicidal ideas. The patient does not have insomnia.      No Known Allergies   Past Medical History:  Diagnosis Date   Cancer ([place])    breast   Cataract    Diabetes mellitus without complication ([place])    Hyperlipidemia    Hypertension    Thyroid disease      Past Surgical History:  Procedure Laterality Date   BREAST SURGERY     EYE SURGERY      Social History   Socioeconomic History   Marital status: Widowed    Spouse name: Not on file   Number of children: 4   Years of education: Not on file   Highest education level: Not on file  Occupational History    Employer: retired    Comment: homemaker  Tobacco Use   Smoking status: Never   Smokeless tobacco: Never  Vaping Use   Vaping Use: Never used  Substance and Sexual Activity   Alcohol use: No   Drug use: No   Sexual activity: Not on file  Other Topics Concern   Not on file  Social History Narrative   Not on file   Social Determinants of Health   Financial Resource Strain: Not on file  Food Insecurity: Not on file  Transportation Needs: Not on file  Physical Activity: Not on file  Stress: Not on file  Social Connections: Not on file  Intimate Partner Violence: Not on file    Family History  Problem Relation Age of Onset   Healthy Sister    Healthy Brother    Healthy Daughter    Healthy Sister    Healthy Daughter    Healthy Daughter      Current Outpatient Medications:    atorvastatin (LIPITOR) 20 MG tablet, TAKE ONE TABLET BY MOUTH EVERY DAY, Disp: 90 tablet, Rfl: 3    Calcium Carbonate-Vitamin D 600-400 MG-UNIT tablet, Take 1 tablet by mouth daily. , Disp: , Rfl:    famotidine (PEPCID) 20 MG tablet, TAKE ONE TABLET BY MOUTH 2 TIMES A DAY, Disp: 180 tablet, Rfl: 3   ibuprofen (ADVIL) 400 MG tablet, Take 1 tablet (400 mg total) by mouth every 6 (six) hours as needed., Disp: 30 tablet, Rfl: 0   levothyroxine (SYNTHROID) 25 MCG tablet, TAKE 1/2 TABLET BY MOUTH ONCE DAILY., Disp: 45 tablet, Rfl: 1   metFORMIN (GLUCOPHAGE) 500 MG tablet, TAKE ONE TABLET BY MOUTH 2 TIMES A DAY WITH A MEAL, Disp: 180 tablet, Rfl: 1   Multiple Vitamin (MULTIVITAMIN) tablet, Take 1 tablet by mouth daily., Disp: , Rfl:    propranolol (INDERAL) 20 MG tablet, TAKE ONE TABLET BY MOUTH 2 TIMES A DAY, Disp: 180 tablet, Rfl: 1   sertraline (ZOLOFT) 25 MG tablet, TAKE ONE TABLET  BY MOUTH EVERY DAY, Disp: 90 tablet, Rfl: 1   tamoxifen (NOLVADEX) 20 MG tablet, TAKE ONE TABLET BY MOUTH EVERY DAY, Disp: 90 tablet, Rfl: 3  Physical exam:  Physical Exam Constitutional:      General: She is not in acute distress. Cardiovascular:     Rate and Rhythm: Normal rate and regular rhythm.     Heart sounds: Normal heart sounds.  Pulmonary:     Effort: Pulmonary effort is normal.     Breath sounds: Normal breath sounds.  Abdominal:     General: Bowel sounds are normal.     Palpations: Abdomen is soft.  Skin:    General: Skin is warm and dry.  Neurological:     Mental Status: She is alert and oriented to person, place, and time.  Patient is s/p bilateral mastectomy without reconstruction.  No evidence of chest wall recurrence.  CMP Latest Ref Rng & Units 11/15/2020  Glucose 65 - 99 mg/dL 105(H)  BUN 8 - 27 mg/dL 15  Creatinine 0.57 - 1.00 mg/dL 0.98  Sodium 134 - 144 mmol/L 136  Potassium 3.5 - 5.2 mmol/L 4.8  Chloride 96 - 106 mmol/L 100  CO2 20 - 29 mmol/L 23  Calcium 8.7 - 10.3 mg/dL 9.7  Total Protein 6.0 - 8.5 g/dL 6.9  Total Bilirubin 0.0 - 1.2 mg/dL <0.2  Alkaline Phos 44 - 121 IU/L 44   AST 0 - 40 IU/L 25  ALT 0 - 32 IU/L 18   CBC Latest Ref Rng & Units 07/29/2020  WBC 4.0 - 10.5 K/uL 11.4(H)  Hemoglobin 12.0 - 15.0 g/dL 11.2(L)  Hematocrit 36.0 - 46.0 % 34.3(L)  Platelets 150 - 400 K/uL 289     Assessment and plan- Patient is a 80 y.o. female with bilateral breast cancer (stage is unclear and ER PR and her 2 neu status is also unclear) s/p neoadjuvant chemotherapy followed bilateral mastectomy without reconstruction, adjuvant chemotherapy, RT and 6 months of tamoxifen in 2017.  Tamoxifen was restarted in July 2019 and this is a routine follow-up visit  Weekly there is no evidence of chest wall recurrence.  We have not been able to confirm that she was truly ER positive but I do not see any downside of continuing tamoxifen given that she had bilateral breast cancer and was prescribed tamoxifen in [place].  She will continue to take it for 5 more years ending in 2027.  She is tolerating it well.  I will repeat a bone density scan in January 2023.  If it remains stable I will hold off on starting bisphosphonates given her age.  She will continue to follow-up with Luna Fuse for her lymphedema  I will see her back in 6 months   Visit Diagnosis 1. Encounter for monitoring tamoxifen therapy   2. Encounter for follow-up surveillance of breast cancer   3. Lymphedema   4. Osteoporosis, unspecified osteoporosis type, unspecified pathological fracture presence      Dr. Randa Evens, MD, MPH [facility] [phone] 02/04/2021 6:05 PM

## 2021-02-06 ENCOUNTER — Encounter: Payer: Self-pay | Admitting: Oncology

## 2021-02-07 ENCOUNTER — Other Ambulatory Visit: Payer: Self-pay | Admitting: Family Medicine

## 2021-02-07 MED ORDER — PROPRANOLOL HCL 20 MG PO TABS
ORAL_TABLET | Freq: Two times a day (BID) | ORAL | 1 refills | Status: DC
  Filled 2021-02-07: qty 180, 90d supply, fill #0
  Filled 2021-05-05: qty 180, 90d supply, fill #1

## 2021-02-07 MED ORDER — METFORMIN HCL 500 MG PO TABS
ORAL_TABLET | Freq: Two times a day (BID) | ORAL | 1 refills | Status: DC
Start: 2021-02-07 — End: 2021-08-03
  Filled 2021-02-07: qty 180, 90d supply, fill #0
  Filled 2021-05-05: qty 180, 90d supply, fill #1

## 2021-02-07 MED FILL — Tamoxifen Citrate Tab 20 MG (Base Equivalent): ORAL | 30 days supply | Qty: 30 | Fill #4 | Status: AC

## 2021-02-07 MED FILL — Famotidine Tab 20 MG: ORAL | 90 days supply | Qty: 180 | Fill #1 | Status: AC

## 2021-02-07 NOTE — Telephone Encounter (Signed)
Requested Prescriptions  Pending Prescriptions Disp Refills  . propranolol (INDERAL) 20 MG tablet 180 tablet 1    Sig: TAKE ONE TABLET BY MOUTH 2 TIMES A DAY     Cardiovascular:  Beta Blockers Passed - 02/07/2021  5:05 PM      Passed - Last BP in normal range    BP Readings from Last 1 Encounters:  11/15/20 115/72         Passed - Last Heart Rate in normal range    Pulse Readings from Last 1 Encounters:  11/15/20 77         Passed - Valid encounter within last 6 months    Recent Outpatient Visits          2 months ago Type 2 diabetes mellitus with other specified complication, without long-term current use of insulin ([place])   [facility] [place], Dionne Bucy, MD   8 months ago Type 2 diabetes mellitus with other specified complication, without long-term current use of insulin ([place])   [facility] [place], Dionne Bucy, MD   1 year ago Essential hypertension   [facility] [place], Dionne Bucy, MD   1 year ago Essential hypertension   [place], Dionne Bucy, MD   2 years ago Type 2 diabetes mellitus with other specified complication, without long-term current use of insulin ([place])   [facility], Dionne Bucy, MD      Future Appointments            In 3 months Bacigalupo, Dionne Bucy, MD [facility]           . metFORMIN (GLUCOPHAGE) 500 MG tablet 180 tablet 1    Sig: TAKE ONE TABLET BY MOUTH 2 TIMES A DAY WITH A MEAL     Endocrinology:  Diabetes - Biguanides Failed - 02/07/2021  5:05 PM      Failed - eGFR in normal range and within 360 days    GFR calc Af Amer  Date Value Ref Range Status  01/18/2020 57 (L) >60 mL/min Final   GFR, Estimated  Date Value Ref Range Status  07/29/2020 60 (L) >60 mL/min Final    Comment:    (NOTE) Calculated using the CKD-EPI Creatinine Equation (2021)    eGFR  Date Value Ref Range Status  11/15/2020 58 (L) >59 mL/min/1.73 Final          Passed - Cr in normal range and within 360 days    Creatinine, Ser  Date Value Ref Range Status  11/15/2020 0.98 0.57 - 1.00 mg/dL Final         Passed - HBA1C is between 0 and 7.9 and within 180 days    Hemoglobin A1C  Date Value Ref Range Status  11/15/2020 6.7 (A) 4.0 - 5.6 % Final   Hgb A1c MFr Bld  Date Value Ref Range Status  05/13/2019 6.8 (H) 4.8 - 5.6 % Final    Comment:             Prediabetes: 5.7 - 6.4          Diabetes: >6.4          Glycemic control for adults with diabetes: <7.0          Passed - Valid encounter within last 6 months    Recent Outpatient Visits          2 months ago Type 2 diabetes mellitus with other specified complication, without long-term current use  of insulin [facility])   [facility] [place], Dionne Bucy, MD   8 months ago Type 2 diabetes mellitus with other specified complication, without long-term current use of insulin [facility])   [facility] [place], Dionne Bucy, MD   1 year ago Essential hypertension   [place], Dionne Bucy, MD   1 year ago Essential hypertension   [place], Dionne Bucy, MD   2 years ago Type 2 diabetes mellitus with other specified complication, without long-term current use of insulin [facility])   [facility], Dionne Bucy, MD      Future Appointments            In 3 months Bacigalupo, Dionne Bucy, MD [facility]

## 2021-02-08 ENCOUNTER — Other Ambulatory Visit: Payer: Self-pay

## 2021-02-16 ENCOUNTER — Ambulatory Visit
Admission: RE | Admit: 2021-02-16 | Discharge: 2021-02-16 | Disposition: A | Discharge status: Home / Self Care | Payer: Self-pay | Source: Ambulatory Visit | Attending: Oncology | Admitting: Oncology

## 2021-02-16 ENCOUNTER — Other Ambulatory Visit: Payer: Self-pay

## 2021-02-16 DIAGNOSIS — I890 Lymphedema, not elsewhere classified: Secondary | ICD-10-CM | POA: Insufficient documentation

## 2021-02-16 DIAGNOSIS — Z853 Personal history of malignant neoplasm of breast: Secondary | ICD-10-CM | POA: Insufficient documentation

## 2021-02-16 DIAGNOSIS — Z08 Encounter for follow-up examination after completed treatment for malignant neoplasm: Secondary | ICD-10-CM | POA: Insufficient documentation

## 2021-02-16 DIAGNOSIS — Z5181 Encounter for therapeutic drug level monitoring: Secondary | ICD-10-CM | POA: Insufficient documentation

## 2021-02-16 DIAGNOSIS — M81 Age-related 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 fracture: Secondary | ICD-10-CM | POA: Insufficient documentation

## 2021-02-16 DIAGNOSIS — Z79810 Long term (current) use of selective estrogen receptor modulators (SERMs): Secondary | ICD-10-CM | POA: Insufficient documentation

## 2021-02-23 ENCOUNTER — Other Ambulatory Visit: Payer: Self-pay

## 2021-03-07 MED FILL — Tamoxifen Citrate Tab 20 MG (Base Equivalent): ORAL | 30 days supply | Qty: 30 | Fill #5 | Status: AC

## 2021-03-08 ENCOUNTER — Other Ambulatory Visit: Payer: Self-pay

## 2021-04-07 ENCOUNTER — Other Ambulatory Visit: Payer: Self-pay

## 2021-04-07 MED FILL — Tamoxifen Citrate Tab 20 MG (Base Equivalent): ORAL | 30 days supply | Qty: 30 | Fill #6 | Status: AC

## 2021-04-10 ENCOUNTER — Other Ambulatory Visit: Payer: Self-pay

## 2021-05-05 ENCOUNTER — Other Ambulatory Visit: Payer: Self-pay | Admitting: Family Medicine

## 2021-05-05 MED FILL — Famotidine Tab 20 MG: ORAL | 90 days supply | Qty: 180 | Fill #2 | Status: AC

## 2021-05-05 MED FILL — Atorvastatin Calcium Tab 20 MG (Base Equivalent): ORAL | 90 days supply | Qty: 90 | Fill #2 | Status: AC

## 2021-05-05 MED FILL — Tamoxifen Citrate Tab 20 MG (Base Equivalent): ORAL | 30 days supply | Qty: 30 | Fill #7 | Status: AC

## 2021-05-06 MED ORDER — LEVOTHYROXINE SODIUM 25 MCG PO TABS
ORAL_TABLET | Freq: Every day | ORAL | 1 refills | Status: DC
  Filled 2021-05-06: qty 30, 60d supply, fill #0
  Filled 2021-06-02 – 2021-06-06 (×2): qty 30, 60d supply, fill #1
  Filled 2021-06-07: qty 15, 30d supply, fill #1
  Filled 2021-08-03: qty 45, 90d supply, fill #2

## 2021-05-06 NOTE — Telephone Encounter (Signed)
Requested Prescriptions  Pending Prescriptions Disp Refills  . levothyroxine (SYNTHROID) 25 MCG tablet 45 tablet 1    Sig: TAKE 1/2 TABLET BY MOUTH ONCE DAILY.     Endocrinology:  Hypothyroid Agents Failed - 05/05/2021  5:09 PM      Failed - TSH needs to be rechecked within 3 months after an abnormal result. Refill until TSH is due.      Failed - TSH in normal range and within 360 days    TSH  Date Value Ref Range Status  11/15/2020 4.770 (H) 0.450 - 4.500 uIU/mL Final         Passed - Valid encounter within last 12 months    Recent Outpatient Visits          5 months ago Type 2 diabetes mellitus with other specified complication, without long-term current use of insulin [facility])   [facility] [place], Dionne Bucy, MD   11 months ago Type 2 diabetes mellitus with other specified complication, without long-term current use of insulin [facility])   [facility], Dionne Bucy, MD   1 year ago Essential hypertension   [facility] [place], Dionne Bucy, MD   1 year ago Essential hypertension   [place], Dionne Bucy, MD   2 years ago Type 2 diabetes mellitus with other specified complication, without long-term current use of insulin [facility])   [place], Dionne Bucy, MD      Future Appointments            In 2 weeks Bacigalupo, Dionne Bucy, MD [facility]

## 2021-05-08 ENCOUNTER — Other Ambulatory Visit: Payer: Self-pay

## 2021-05-09 ENCOUNTER — Other Ambulatory Visit: Payer: Self-pay

## 2021-05-23 ENCOUNTER — Ambulatory Visit: Payer: Self-pay | Admitting: Family Medicine

## 2021-06-02 ENCOUNTER — Other Ambulatory Visit: Payer: Self-pay | Admitting: Family Medicine

## 2021-06-02 MED FILL — Tamoxifen Citrate Tab 20 MG (Base Equivalent): ORAL | 30 days supply | Qty: 30 | Fill #8 | Status: AC

## 2021-06-03 NOTE — Telephone Encounter (Signed)
Requested medication (s) are due for refill today:   Yes  Requested medication (s) are on the active medication list:   Yes  Future visit scheduled:   Yes   Last ordered: 12/09/2020 - 12/09/2021 #90, 1 refill  Returned because got a high warning from pharmacy with the tamoxifen and sertraline that it may increase the risk of breast cancer recurrence.      Requested Prescriptions  Pending Prescriptions Disp Refills   sertraline (ZOLOFT) 25 MG tablet 90 tablet 0    Sig: TAKE ONE TABLET BY MOUTH EVERY DAY     Psychiatry:  Antidepressants - SSRI Failed - 06/02/2021  2:10 PM      Failed - Valid encounter within last 6 months    Recent Outpatient Visits           6 months ago Type 2 diabetes mellitus with other specified complication, without long-term current use of insulin [facility])   [facility] [employer], Dionne Bucy, MD   1 year ago Type 2 diabetes mellitus with other specified complication, without long-term current use of insulin [facility])   [facility], Dionne Bucy, MD   1 year ago Essential hypertension   [place], Dionne Bucy, MD   2 years ago Essential hypertension   [place], Dionne Bucy, MD   2 years ago Type 2 diabetes mellitus with other specified complication, without long-term current use of insulin [facility])   [place], Dionne Bucy, MD       Future Appointments             In 2 months Bacigalupo, Dionne Bucy, MD [facility]            Passed - Completed PHQ-2 or PHQ-9 in the last 360 days

## 2021-06-05 ENCOUNTER — Other Ambulatory Visit: Payer: Self-pay

## 2021-06-05 MED ORDER — SERTRALINE HCL 25 MG PO TABS
ORAL_TABLET | Freq: Every day | ORAL | 0 refills | Status: DC
  Filled 2021-06-05: qty 30, 30d supply, fill #0
  Filled 2021-08-03: qty 30, 30d supply, fill #1
  Filled 2021-09-05: qty 30, 30d supply, fill #2

## 2021-06-05 NOTE — Telephone Encounter (Signed)
Returned because got a high warning from pharmacy with the tamoxifen and sertraline that it may increase the risk of breast cancer recurrence.

## 2021-06-06 ENCOUNTER — Other Ambulatory Visit: Payer: Self-pay

## 2021-06-07 ENCOUNTER — Other Ambulatory Visit: Payer: Self-pay

## 2021-08-03 ENCOUNTER — Other Ambulatory Visit: Payer: Self-pay

## 2021-08-03 ENCOUNTER — Other Ambulatory Visit: Payer: Self-pay | Admitting: Family Medicine

## 2021-08-03 MED FILL — Atorvastatin Calcium Tab 20 MG (Base Equivalent): ORAL | 90 days supply | Qty: 90 | Fill #3 | Status: AC

## 2021-08-03 MED FILL — Famotidine Tab 20 MG: ORAL | 90 days supply | Qty: 180 | Fill #3 | Status: AC

## 2021-08-03 MED FILL — Tamoxifen Citrate Tab 20 MG (Base Equivalent): ORAL | 30 days supply | Qty: 30 | Fill #9 | Status: AC

## 2021-08-03 NOTE — Telephone Encounter (Signed)
Requested medications are due for refill today.  yes  Requested medications are on the active medications list.  yes  Last refill. Both 02/07/2021  Future visit scheduled.   no  Notes to clinic.  Pt was to [facility]/18/[facility]. Appointment was cancelled by provider.     Requested Prescriptions  Pending Prescriptions Disp Refills   metFORMIN (GLUCOPHAGE) 500 MG tablet 180 tablet 1    Sig: TAKE ONE TABLET BY MOUTH 2 TIMES A DAY WITH A MEAL     Endocrinology:  Diabetes - Biguanides Failed - 08/03/2021  1:52 PM      Failed - HBA1C is between 0 and 7.9 and within 180 days    Hemoglobin A1C  Date Value Ref Range Status  11/15/2020 6.7 (A) 4.0 - 5.6 % Final   Hgb A1c MFr Bld  Date Value Ref Range Status  05/13/2019 6.8 (H) 4.8 - 5.6 % Final    Comment:             Prediabetes: 5.7 - 6.4          Diabetes: >6.4          Glycemic control for adults with diabetes: <7.0           Failed - eGFR in normal range and within 360 days    GFR calc Af Amer  Date Value Ref Range Status  01/18/2020 57 (L) >60 mL/min Final   GFR, Estimated  Date Value Ref Range Status  07/29/2020 60 (L) >60 mL/min Final    Comment:    (NOTE) Calculated using the CKD-EPI Creatinine Equation (2021)    eGFR  Date Value Ref Range Status  11/15/2020 58 (L) >59 mL/min/1.73 Final          Failed - Valid encounter within last 6 months    Recent Outpatient Visits           8 months ago Type 2 diabetes mellitus with other specified complication, without long-term current use of insulin ([place])   [facility] [place], Dionne Bucy, MD   1 year ago Type 2 diabetes mellitus with other specified complication, without long-term current use of insulin ([place])   [place], Dionne Bucy, MD   1 year ago Essential hypertension   [place], Dionne Bucy, MD   2 years ago Essential hypertension   [place], Dionne Bucy, MD   2 years ago  Type 2 diabetes mellitus with other specified complication, without long-term current use of insulin [facility])   [place], Dionne Bucy, MD              Passed - Cr in normal range and within 360 days    Creatinine, Ser  Date Value Ref Range Status  11/15/2020 0.98 0.57 - 1.00 mg/dL Final           propranolol (INDERAL) 20 MG tablet 180 tablet 1    Sig: TAKE ONE TABLET BY MOUTH 2 TIMES A DAY     Cardiovascular:  Beta Blockers Failed - 08/03/2021  1:52 PM      Failed - Valid encounter within last 6 months    Recent Outpatient Visits           8 months ago Type 2 diabetes mellitus with other specified complication, without long-term current use of insulin [facility])   [facility] [place], Dionne Bucy, MD   1 year ago Type 2 diabetes mellitus with other specified complication,  without long-term current use of insulin [facility])   [facility] [employer], Dionne Bucy, MD   1 year ago Essential hypertension   [place], Dionne Bucy, MD   2 years ago Essential hypertension   [place], Dionne Bucy, MD   2 years ago Type 2 diabetes mellitus with other specified complication, without long-term current use of insulin [facility])   [facility], Dionne Bucy, MD              Passed - Last BP in normal range    BP Readings from Last 1 Encounters:  11/15/20 115/72          Passed - Last Heart Rate in normal range    Pulse Readings from Last 1 Encounters:  11/15/20 77

## 2021-08-04 ENCOUNTER — Other Ambulatory Visit: Payer: Self-pay

## 2021-08-04 MED ORDER — PROPRANOLOL HCL 20 MG PO TABS
ORAL_TABLET | Freq: Two times a day (BID) | ORAL | 1 refills | Status: DC
  Filled 2021-08-04: qty 180, 90d supply, fill #0
  Filled 2021-10-31: qty 180, 90d supply, fill #1

## 2021-08-04 MED ORDER — METFORMIN HCL 500 MG PO TABS
ORAL_TABLET | Freq: Two times a day (BID) | ORAL | 1 refills | Status: DC
  Filled 2021-08-04: qty 180, 90d supply, fill #0
  Filled 2021-10-31: qty 180, 90d supply, fill #1

## 2021-08-04 NOTE — Telephone Encounter (Signed)
LOV:11/15/2021 LR:02/07/22 Future visit scheduled. no   Notes to clinic.  Pt was to [facility] 05/18/2021. Appointment was cancelled by provider.  FYI: called patient and scheduled  for 02/13 follow up appointment.

## 2021-08-11 ENCOUNTER — Inpatient Hospital Stay: Payer: Self-pay | Attending: Oncology | Admitting: Oncology

## 2021-08-11 ENCOUNTER — Other Ambulatory Visit: Payer: Self-pay

## 2021-08-11 ENCOUNTER — Other Ambulatory Visit: Payer: Self-pay | Admitting: *Deleted

## 2021-08-11 ENCOUNTER — Encounter: Payer: Self-pay | Admitting: Nurse Practitioner

## 2021-08-11 VITALS — Temp 97.6°F | Wt 105.2 lb

## 2021-08-11 DIAGNOSIS — C50912 Malignant neoplasm of unspecified site of left female breast: Secondary | ICD-10-CM | POA: Insufficient documentation

## 2021-08-11 DIAGNOSIS — E119 Type 2 diabetes mellitus without complications: Secondary | ICD-10-CM | POA: Insufficient documentation

## 2021-08-11 DIAGNOSIS — Z170 Estrogen receptor positive status [ER+]: Secondary | ICD-10-CM | POA: Insufficient documentation

## 2021-08-11 DIAGNOSIS — Z7984 Long term (current) use of oral hypoglycemic drugs: Secondary | ICD-10-CM | POA: Insufficient documentation

## 2021-08-11 DIAGNOSIS — Z853 Personal history of malignant neoplasm of breast: Secondary | ICD-10-CM

## 2021-08-11 DIAGNOSIS — E1136 Type 2 diabetes mellitus with diabetic cataract: Secondary | ICD-10-CM | POA: Insufficient documentation

## 2021-08-11 DIAGNOSIS — C50911 Malignant neoplasm of unspecified site of right female breast: Secondary | ICD-10-CM | POA: Insufficient documentation

## 2021-08-11 DIAGNOSIS — I890 Lymphedema, not elsewhere classified: Secondary | ICD-10-CM | POA: Insufficient documentation

## 2021-08-11 DIAGNOSIS — E785 Hyperlipidemia, unspecified: Secondary | ICD-10-CM | POA: Insufficient documentation

## 2021-08-11 DIAGNOSIS — Z79810 Long term (current) use of selective estrogen receptor modulators (SERMs): Secondary | ICD-10-CM | POA: Insufficient documentation

## 2021-08-11 DIAGNOSIS — I10 Essential (primary) hypertension: Secondary | ICD-10-CM | POA: Insufficient documentation

## 2021-08-11 DIAGNOSIS — Z79899 Other long term (current) drug therapy: Secondary | ICD-10-CM | POA: Insufficient documentation

## 2021-08-11 DIAGNOSIS — Z5181 Encounter for therapeutic drug level monitoring: Secondary | ICD-10-CM

## 2021-08-11 DIAGNOSIS — E079 Disorder of thyroid, unspecified: Secondary | ICD-10-CM | POA: Insufficient documentation

## 2021-08-11 MED ORDER — TAMOXIFEN CITRATE 20 MG PO TABS
ORAL_TABLET | Freq: Every day | ORAL | 3 refills | Status: DC
  Filled 2021-08-11: qty 90, fill #0
  Filled 2021-09-05: qty 30, 30d supply, fill #0
  Filled 2021-10-03: qty 30, 30d supply, fill #1
  Filled 2021-10-31: qty 30, 30d supply, fill #2
  Filled 2021-12-01: qty 30, 30d supply, fill #3
  Filled 2021-12-04: qty 30, 30d supply, fill #0
  Filled 2021-12-31: qty 30, 30d supply, fill #1
  Filled 2022-01-26: qty 30, 30d supply, fill #0
  Filled 2022-01-29: qty 60, 60d supply, fill #0
  Filled 2022-03-31: qty 60, 60d supply, fill #1
  Filled 2022-04-02: qty 90, 90d supply, fill #1
  Filled 2022-06-24: qty 30, 30d supply, fill #2
  Filled 2022-07-22: qty 30, 30d supply, fill #3

## 2021-08-11 NOTE — Progress Notes (Signed)
Pt is doing well. No questions or concerns for provider today.

## 2021-08-13 NOTE — Progress Notes (Signed)
Hematology/Oncology Consult note [facility]  Telephone:([phone] [phone]  Patient Care Team: Virginia Crews, MD as PCP - General (Family Medicine)   Name of the patient: Erin Wagner  [id number]  May 04, 1941   Date of visit: 08/13/21  Diagnosis-  h/o b/l breast cancer treated in [place] currently on tamoxifen  Chief complaint/ Reason for visit- routine f/u of breast cancer on tamoxifen  Heme/Onc history: Patient is a 81 year old [place] He[place] speaking female from [place] with a past medical history significant for hypertension hyperlipidemia, diabetes and bilateral breast cancer.  She is here with son and daughter in law. She has refused formal language interpretor and would like her family to interpret for her. Her family is comfortable speaking in Hindi with me and translating for her.   Her oncology history is as follows:   1.  She was diagnosed with bilateral breast cancer in July 2017 in [place].  She initially received neoadjuvant chemotherapy with 3 cycles of epirubicin and cyclophosphamide.  This was followed by bilateral modified radical mastectomy.  I do not have the actual pathology report from [place].  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  I am unable to decipher the written handwriting well.  Appears to have 1 out of 13 lymph nodes positive with PNE positive, no LV I or perineural invasion.  Left breast mass showed 6 cm mixed infiltrating ductal and lobular histology.  Again handwritten notes mention 3 out of 16 lymph nodes positive.  In some other page on the outside report, the right breast cancer has been mentioned as T2 N0 in the left breast cancer has been mentioned as T4N0.   2.  Following surgery she had 1 cycle of epirubicin and cyclophosphamide and 2 cycles of doxorubicin.  She then had IMRT to her bilateral breasts  over 5 weeks.  She was then asked to take tamoxifen for 6 months only.  There is no mention about ER PR and HER-2 status anywhere in the outside reports. Patient was asked to take tamoxifen for 6 months following radiation and then stop it.   3. 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  Patient had a bone density scan in July 2019 which showed significant osteoporosis.  Since definitive evidence of ER status was not obtainable, given history of bilateral node positive breast cancer and was to proceed with tamoxifen assuming that it was ER PR positive.  Tamoxifen 20 mg started July 2019.  Fosamax weekly started for osteoporosis.  She also had CT chest abdomen and pelvis as well as bone scan which did not reveal any evidence of malignancy.  Focally increased activity in the left second rib to the presence of subacute fracture.  Interval history-patient recently went to [place] for her granddaughters wedding. She tolerated the long distance trip well.  She is here with her son today.  Denies any complaints at this time.  Left arm lymphedema has improved significantly after pursuing occupational therapy.  ECOG PS- 1 Pain scale- 0   Review of systems- Review of Systems  Constitutional:  Negative for chills, fever, malaise/fatigue and weight loss.  HENT:  Negative for congestion, ear discharge and nosebleeds.   Eyes:  Negative for blurred vision.  Respiratory:  Negative for cough, hemoptysis, sputum production, shortness of breath and wheezing.   Cardiovascular:  Negative for chest pain, palpitations, orthopnea and claudication.  Gastrointestinal:  Negative for abdominal pain, blood in stool, constipation, diarrhea, heartburn, melena, nausea and vomiting.  Genitourinary:  Negative for dysuria, flank pain, frequency, hematuria and urgency.  Musculoskeletal:   Negative for back pain, joint pain and myalgias.  Skin:  Negative for rash.  Neurological:  Negative for dizziness, tingling, focal weakness, seizures, weakness and headaches.  Endo/Heme/Allergies:  Does not bruise/bleed easily.  Psychiatric/Behavioral:  Negative for depression and suicidal ideas. The patient does not have insomnia.       No Known Allergies   Past Medical History:  Diagnosis Date   Cancer ([place])    breast   Cataract    Diabetes mellitus without complication ([place])    Hyperlipidemia    Hypertension    Thyroid disease      Past Surgical History:  Procedure Laterality Date   BREAST SURGERY     EYE SURGERY      Social History   Socioeconomic History   Marital status: Widowed    Spouse name: Not on file   Number of children: 4   Years of education: Not on file   Highest education level: Not on file  Occupational History    Employer: retired    Comment: homemaker  Tobacco Use   Smoking status: Never   Smokeless tobacco: Never  Vaping Use   Vaping Use: Never used  Substance and Sexual Activity   Alcohol use: No   Drug use: No   Sexual activity: Not on file  Other Topics Concern   Not on file  Social History Narrative   Not on file   Social Determinants of Health   Financial Resource Strain: Not on file  Food Insecurity: Not on file  Transportation Needs: Not on file  Physical Activity: Not on file  Stress: Not on file  Social Connections: Not on file  Intimate Partner Violence: Not on file    Family History  Problem Relation Age of Onset   Healthy Sister    Healthy Brother    Healthy Daughter    Healthy Sister    Healthy Daughter    Healthy Daughter      Current Outpatient Medications:    atorvastatin (LIPITOR) 20 MG tablet, TAKE ONE TABLET BY MOUTH EVERY DAY, Disp: 90 tablet, Rfl: 3   Calcium Carbonate-Vitamin D 600-400 MG-UNIT tablet, Take 1 tablet by mouth daily. , Disp: , Rfl:    famotidine (PEPCID) 20 MG tablet, TAKE ONE  TABLET BY MOUTH 2 TIMES A DAY, Disp: 180 tablet, Rfl: 3   ibuprofen (ADVIL) 400 MG tablet, Take 1 tablet (400 mg total) by mouth every 6 (six) hours as needed., Disp: 30 tablet, Rfl: 0   levothyroxine (SYNTHROID) 25 MCG tablet, TAKE 1/2 TABLET BY MOUTH ONCE DAILY., Disp: 45 tablet, Rfl: 1   metFORMIN (GLUCOPHAGE) 500 MG tablet, TAKE ONE TABLET BY MOUTH 2 TIMES A DAY WITH A MEAL, Disp: 180 tablet, Rfl: 1   Multiple Vitamin (MULTIVITAMIN) tablet, Take 1 tablet by mouth daily., Disp: , Rfl:    propranolol (INDERAL) 20 MG tablet, TAKE ONE TABLET BY MOUTH 2 TIMES A DAY, Disp: 180 tablet, Rfl: 1   sertraline (ZOLOFT) 25 MG tablet, TAKE ONE TABLET BY MOUTH EVERY DAY, Disp: 90 tablet, Rfl: 0   tamoxifen (NOLVADEX) 20 MG tablet, TAKE ONE TABLET BY MOUTH EVERY DAY, Disp: 90 tablet, Rfl: 3  Physical exam:  Vitals:   08/11/21 1424  Temp: 97.6 F (36.4 C)  TempSrc: Tympanic  Weight: 105 lb 3.2 oz (47.7 kg)   Physical Exam Constitutional:      General: She is not in acute distress. Cardiovascular:     Rate and Rhythm: Normal rate and regular rhythm.     Heart sounds: Normal heart sounds.  Pulmonary:     Effort: Pulmonary effort is normal.     Breath sounds: Normal breath sounds.  Abdominal:     General: Bowel sounds are normal.     Palpations: Abdomen is soft.  Skin:    General: Skin is warm and dry.  Neurological:     Mental Status: She is alert and oriented to person, place, and time.    Chest wall exam: Patient is s/p bilateral mastectomy without reconstruction.  No evidence of chest wall recurrence.  No palpable bilateral axillary adenopathy.  Lymphedema in her left upper extremity appears improved.   CMP Latest Ref Rng & Units 11/15/2020  Glucose 65 - 99 mg/dL 105(H)  BUN 8 - 27 mg/dL 15  Creatinine 0.57 - 1.00 mg/dL 0.98  Sodium 134 - 144 mmol/L 136  Potassium 3.5 - 5.2 mmol/L 4.8  Chloride 96 - 106 mmol/L 100  CO2 20 - 29 mmol/L 23  Calcium 8.7 - 10.3 mg/dL 9.7  Total Protein  6.0 - 8.5 g/dL 6.9  Total Bilirubin 0.0 - 1.2 mg/dL <0.2  Alkaline Phos 44 - 121 IU/L 44  AST 0 - 40 IU/L 25  ALT 0 - 32 IU/L 18   CBC Latest Ref Rng & Units 07/29/2020  WBC 4.0 - 10.5 K/uL 11.4(H)  Hemoglobin 12.0 - 15.0 g/dL 11.2(L)  Hematocrit 36.0 - 46.0 % 34.3(L)  Platelets 150 - 400 K/uL 289   Assessment and plan- Patient is a 81 y.o. female with bilateral breast cancer (stage is unclear and ER PR and her 2 neu status is also unclear) s/p neoadjuvant chemotherapy followed bilateral mastectomy without reconstruction, adjuvant chemotherapy, RT and 6 months of tamoxifen in 2017.  Patient is currently on tamoxifen which was restarted in 2019 and this is a routine follow-up visit  Patient is currently on tamoxifen and tolerating it well.  Clinically no signs and symptoms concerning for recurrence.  She does have baseline osteoporosis but has not started bisphosphonates yet.  I will repeat a bone density scan and if scores are consistently getting worse we will plan to start her on bisphosphonate at that time   Visit Diagnosis 1. Encounter for monitoring tamoxifen therapy   2. History of breast cancer      Dr. Randa Evens, MD, MPH [facility] [phone] 08/13/2021 10:20 AM

## 2021-08-14 ENCOUNTER — Other Ambulatory Visit: Payer: Self-pay

## 2021-08-17 ENCOUNTER — Ambulatory Visit: Payer: Self-pay | Admitting: Family Medicine

## 2021-08-21 ENCOUNTER — Encounter: Payer: Self-pay | Admitting: Family Medicine

## 2021-08-21 ENCOUNTER — Other Ambulatory Visit: Payer: Self-pay

## 2021-08-21 ENCOUNTER — Ambulatory Visit (INDEPENDENT_AMBULATORY_CARE_PROVIDER_SITE_OTHER): Payer: Self-pay | Admitting: Family Medicine

## 2021-08-21 VITALS — BP 124/57 | HR 81 | Temp 98.2°F | Resp 16 | Ht <= 58 in | Wt 106.9 lb

## 2021-08-21 DIAGNOSIS — E1159 Type 2 diabetes mellitus with other circulatory complications: Secondary | ICD-10-CM

## 2021-08-21 DIAGNOSIS — F341 Dysthymic disorder: Secondary | ICD-10-CM

## 2021-08-21 DIAGNOSIS — I152 Hypertension secondary to endocrine disorders: Secondary | ICD-10-CM

## 2021-08-21 DIAGNOSIS — E785 Hyperlipidemia, unspecified: Secondary | ICD-10-CM

## 2021-08-21 DIAGNOSIS — E1169 Type 2 diabetes mellitus with other specified complication: Secondary | ICD-10-CM

## 2021-08-21 DIAGNOSIS — E039 Hypothyroidism, unspecified: Secondary | ICD-10-CM

## 2021-08-21 MED ORDER — AZITHROMYCIN 250 MG PO TABS
ORAL_TABLET | ORAL | 0 refills | Status: AC
  Filled 2021-08-21: qty 6, 5d supply, fill #0

## 2021-08-21 NOTE — Progress Notes (Signed)
Established patient visit   Patient: Erin Wagner   DOB: [birth year]   81 y.o. Female  MRN: [id number] Visit Date: 08/21/2021  Today's healthcare provider: Lavon Paganini, MD   Chief Complaint  Patient presents with   Diabetes   Hypertension   URI   I,Sulibeya S Dimas,acting as a scribe for Lavon Paganini, MD.,have documented all relevant documentation on the behalf of Lavon Paganini, MD,as directed by  Lavon Paganini, MD while in the presence of Lavon Paganini, MD.  Subjective    HPI HPI     URI   Associated symptoms inlclude congestion, cough and rhinorrhea.  Recent episode started in the past 7 days.  The problem has been unchanged since onset.  The temperature has been with in normal range.  Patient  is drinking plenty of fluids.  Past hisotry is significant for  no history of pneumonia or bronchitis.  Patient is not a smoker.      Last edited by Dorian Pod, CMA on 08/21/2021  4:06 PM.      Diabetes Mellitus Type II, Follow-up  Lab Results  Component Value Date   HGBA1C 6.7 (A) 11/15/2020   HGBA1C 6.8 (A) 05/16/2020   HGBA1C 7.0 (A) 11/11/2019   Wt Readings from Last 3 Encounters:  08/21/21 106 lb 14.4 oz (48.5 kg)  08/11/21 105 lb 3.2 oz (47.7 kg)  11/15/20 108 lb (49 kg)   Last seen for diabetes 9 months ago.  Management since then includes no changes. She reports excellent compliance with treatment. She is not having side effects.  Symptoms: No fatigue No foot ulcerations  No appetite changes No nausea  No paresthesia of the feet  No polydipsia  No polyuria No visual disturbances   No vomiting     Home blood sugar records:  not being checked  Episodes of hypoglycemia? No    Current insulin regiment: none Most Recent Eye Exam: na Current exercise: walking Current diet habits: in general, a "healthy" diet    Pertinent Labs: Lab Results  Component Value Date   CHOL 124 11/15/2020   HDL 45 11/15/2020    LDLCALC 46 11/15/2020   TRIG 204 (H) 11/15/2020   CHOLHDL 2.8 11/15/2020   Lab Results  Component Value Date   NA 136 11/15/2020   K 4.8 11/15/2020   CREATININE 0.98 11/15/2020   EGFR 58 (L) 11/15/2020   MICROALBUR 20 11/11/2019     --------------------------------------------------------------------------------------------------- Hypertension, follow-up  BP Readings from Last 3 Encounters:  08/21/21 (!) 124/57  11/15/20 115/72  07/29/20 126/63   Wt Readings from Last 3 Encounters:  08/21/21 106 lb 14.4 oz (48.5 kg)  08/11/21 105 lb 3.2 oz (47.7 kg)  11/15/20 108 lb (49 kg)     She was last seen for hypertension 9 months ago.  BP at that visit was 115/72. Management since that visit includes no changes.  She reports excellent compliance with treatment. She is not having side effects.  She is following a Regular diet. She is not exercising. She does not smoke.  Use of agents associated with hypertension: none.   Outside blood pressures are not being checked. Symptoms: No chest pain No chest pressure  No palpitations No syncope  No dyspnea No orthopnea  No paroxysmal nocturnal dyspnea No lower extremity edema   Pertinent labs: Lab Results  Component Value Date   CHOL 124 11/15/2020   HDL 45 11/15/2020   LDLCALC 46 11/15/2020   TRIG 204 (H) 11/15/2020  CHOLHDL 2.8 11/15/2020   Lab Results  Component Value Date   NA 136 11/15/2020   K 4.8 11/15/2020   CREATININE 0.98 11/15/2020   EGFR 58 (L) 11/15/2020   GLUCOSE 105 (H) 11/15/2020   TSH 4.770 (H) 11/15/2020     The ASCVD Risk score (Arnett DK, et al., 2019) failed to calculate for the following reasons:   The 2019 ASCVD risk score is only valid for ages 26 to 30   ---------------------------------------------------------------------------------------------------   Medications: Outpatient Medications Prior to Visit  Medication Sig   atorvastatin (LIPITOR) 20 MG tablet TAKE ONE TABLET BY MOUTH EVERY  DAY   Calcium Carbonate-Vitamin D 600-400 MG-UNIT tablet Take 1 tablet by mouth daily.    famotidine (PEPCID) 20 MG tablet TAKE ONE TABLET BY MOUTH 2 TIMES A DAY   ibuprofen (ADVIL) 400 MG tablet Take 1 tablet (400 mg total) by mouth every 6 (six) hours as needed.   levothyroxine (SYNTHROID) 25 MCG tablet TAKE 1/2 TABLET BY MOUTH ONCE DAILY.   metFORMIN (GLUCOPHAGE) 500 MG tablet TAKE ONE TABLET BY MOUTH 2 TIMES A DAY WITH A MEAL   Multiple Vitamin (MULTIVITAMIN) tablet Take 1 tablet by mouth daily.   propranolol (INDERAL) 20 MG tablet TAKE ONE TABLET BY MOUTH 2 TIMES A DAY   sertraline (ZOLOFT) 25 MG tablet TAKE ONE TABLET BY MOUTH EVERY DAY   tamoxifen (NOLVADEX) 20 MG tablet TAKE ONE TABLET BY MOUTH ONCE EVERY DAY.   No facility-administered medications prior to visit.    Review of Systems  Constitutional:  Negative for appetite change and fatigue.  HENT:  Positive for congestion and rhinorrhea.   Respiratory:  Positive for cough.   Cardiovascular:  Negative for chest pain and palpitations.       Objective    BP (!) 124/57 (BP Location: Left Arm, Patient Position: Sitting, Cuff Size: Normal)    Pulse 81    Temp 98.2 F (36.8 C) (Temporal)    Resp 16    Ht '4\' 5"'  (1.346 m)    Wt 106 lb 14.4 oz (48.5 kg)    SpO2 100%    BMI 26.76 kg/m  BP Readings from Last 3 Encounters:  08/21/21 (!) 124/57  11/15/20 115/72  07/29/20 126/63   Wt Readings from Last 3 Encounters:  08/21/21 106 lb 14.4 oz (48.5 kg)  08/11/21 105 lb 3.2 oz (47.7 kg)  11/15/20 108 lb (49 kg)      Physical Exam Vitals reviewed.  Constitutional:      General: She is not in acute distress.    Appearance: Normal appearance. She is well-developed. She is not diaphoretic.  HENT:     Head: Normocephalic and atraumatic.  Eyes:     General: No scleral icterus.    Conjunctiva/sclera: Conjunctivae normal.  Neck:     Thyroid: No thyromegaly.  Cardiovascular:     Rate and Rhythm: Normal rate and regular rhythm.      Pulses: Normal pulses.     Heart sounds: Normal heart sounds. No murmur heard. Pulmonary:     Effort: Pulmonary effort is normal. No respiratory distress.     Breath sounds: Normal breath sounds. No wheezing, rhonchi or rales.  Musculoskeletal:     Cervical back: Neck supple.     Right lower leg: No edema.     Left lower leg: No edema.  Lymphadenopathy:     Cervical: No cervical adenopathy.  Skin:    General: Skin is warm and dry.  Findings: No rash.  Neurological:     Mental Status: She is alert and oriented to person, place, and time. Mental status is at baseline.  Psychiatric:        Mood and Affect: Mood normal.        Behavior: Behavior normal.    Diabetic Foot Exam - Simple   Simple Foot Form Diabetic Foot exam was performed with the following findings: Yes 08/21/2021  4:27 PM  Visual Inspection No deformities, no ulcerations, no other skin breakdown bilaterally: Yes Sensation Testing Intact to touch and monofilament testing bilaterally: Yes Pulse Check Posterior Tibialis and Dorsalis pulse intact bilaterally: Yes Comments      No results found for any visits on 08/21/21.  Assessment & Plan     Problem List Items Addressed This Visit       Cardiovascular and Mediastinum   Hypertension associated with diabetes ([place])    Well controlled Continue current medications Recheck metabolic panel F/u in 6 months       Relevant Orders   Comprehensive metabolic panel     Endocrine   Diabetes mellitus ([place]) - Primary    Well controlled with last A1c - recheck today Continue current medications Will take pcv20 at next visit Unable to afford eye exam (not covered on [facility] card) Foot exam and uACR today On Statin Discussed diet and exercise F/u in 6 months       Relevant Orders   Hemoglobin A1c   Urine Microalbumin w/creat. ratio   Hyperlipidemia associated with type 2 diabetes mellitus ([facility])    Previously well controlled Continue statin Repeat FLP and  CMP Goal LDL < 70       Relevant Orders   Lipid Panel With LDL/HDL Ratio   Hypothyroidism    Previously slightly elevated Continue Synthroid at current dose  Recheck TSH and adjust Synthroid as indicated        Relevant Orders   TSH     Other   Depression    Chronic and stable Continue low dose Zoloft        Return in about 6 months (around 02/18/2022) for chronic disease f/u.      I, Lavon Paganini, MD, have reviewed all documentation for this visit. The documentation on 08/21/21 for the exam, diagnosis, procedures, and orders are all accurate and complete.   Robbie Nangle, Dionne Bucy, MD, MPH [facility]

## 2021-08-21 NOTE — Assessment & Plan Note (Signed)
Well controlled Continue current medications Recheck metabolic panel F/u in 6 months  

## 2021-08-21 NOTE — Assessment & Plan Note (Signed)
Previously slightly elevated Continue Synthroid at current dose  Recheck TSH and adjust Synthroid as indicated

## 2021-08-21 NOTE — Assessment & Plan Note (Signed)
Chronic and stable Continue low dose Zoloft

## 2021-08-21 NOTE — Assessment & Plan Note (Signed)
Well controlled with last A1c - recheck today Continue current medications Will take pcv20 at next visit Unable to afford eye exam (not covered on [facility] card) Foot exam and uACR today On Statin Discussed diet and exercise F/u in 6 months

## 2021-08-21 NOTE — Assessment & Plan Note (Signed)
Previously well controlled Continue statin Repeat FLP and CMP Goal LDL < 70 

## 2021-08-22 ENCOUNTER — Other Ambulatory Visit: Payer: Self-pay

## 2021-08-22 LAB — COMPREHENSIVE METABOLIC PANEL
ALT: 13 IU/L (ref 0–32)
AST: 22 IU/L (ref 0–40)
Albumin/Globulin Ratio: 1.3 (ref 1.2–2.2)
Albumin: 4 g/dL (ref 3.6–4.6)
Alkaline Phosphatase: 36 IU/L — ABNORMAL LOW (ref 44–121)
BUN/Creatinine Ratio: 16 (ref 12–28)
BUN: 15 mg/dL (ref 8–27)
Bilirubin Total: <0.2 mg/dL (ref 0.0–1.2)
CO2: 22 mmol/L (ref 20–29)
Calcium: 9.6 mg/dL (ref 8.7–10.3)
Chloride: 97 mmol/L (ref 96–106)
Creatinine, Ser: 0.91 mg/dL (ref 0.57–1.00)
Globulin, Total: 3 g/dL (ref 1.5–4.5)
Glucose: 94 mg/dL (ref 70–99)
Potassium: 4.5 mmol/L (ref 3.5–5.2)
Sodium: 131 mmol/L — ABNORMAL LOW (ref 134–144)
Total Protein: 7 g/dL (ref 6.0–8.5)
eGFR: 63 mL/min/{1.73_m2} (ref >59)

## 2021-08-22 LAB — LIPID PANEL WITH LDL/HDL RATIO
Cholesterol, Total: 137 mg/dL (ref 100–199)
HDL: 43 mg/dL (ref >39)
LDL Chol Calc (NIH): 55 mg/dL (ref 0–99)
LDL/HDL Ratio: 1.3 ratio (ref 0.0–3.2)
Triglycerides: 246 mg/dL — ABNORMAL HIGH (ref 0–149)
VLDL Cholesterol Cal: 39 mg/dL (ref 5–40)

## 2021-08-22 LAB — MICROALBUMIN / CREATININE URINE RATIO
Creatinine, Urine: 35.2 mg/dL
Microalb/Creat Ratio: 19 mg/g creat (ref 0–29)
Microalbumin, Urine: 6.6 ug/mL

## 2021-08-22 LAB — HEMOGLOBIN A1C
Est. average glucose Bld gHb Est-mCnc: 137 mg/dL
Hgb A1c MFr Bld: 6.4 % — ABNORMAL HIGH (ref 4.8–5.6)

## 2021-08-22 LAB — TSH: TSH: 2.33 u[IU]/mL (ref 0.450–4.500)

## 2021-09-05 ENCOUNTER — Other Ambulatory Visit: Payer: Self-pay

## 2021-09-06 ENCOUNTER — Other Ambulatory Visit: Payer: Self-pay

## 2021-10-03 ENCOUNTER — Other Ambulatory Visit: Payer: Self-pay

## 2021-10-03 ENCOUNTER — Other Ambulatory Visit: Payer: Self-pay | Admitting: Family Medicine

## 2021-10-03 MED ORDER — SERTRALINE HCL 25 MG PO TABS
ORAL_TABLET | Freq: Every day | ORAL | 0 refills | Status: DC
  Filled 2021-10-03: qty 30, 30d supply, fill #0
  Filled 2021-10-31: qty 30, 30d supply, fill #1
  Filled 2021-12-01: qty 30, 30d supply, fill #2
  Filled 2021-12-04: qty 30, 30d supply, fill #0

## 2021-10-04 ENCOUNTER — Other Ambulatory Visit: Payer: Self-pay

## 2021-10-31 ENCOUNTER — Other Ambulatory Visit: Payer: Self-pay | Admitting: Family Medicine

## 2021-10-31 ENCOUNTER — Other Ambulatory Visit: Payer: Self-pay

## 2021-10-31 DIAGNOSIS — K219 Gastro-esophageal reflux disease without esophagitis: Secondary | ICD-10-CM

## 2021-11-01 ENCOUNTER — Other Ambulatory Visit: Payer: Self-pay

## 2021-11-01 MED ORDER — FAMOTIDINE 20 MG PO TABS
ORAL_TABLET | Freq: Two times a day (BID) | ORAL | 3 refills | Status: DC
  Filled 2021-11-01: qty 180, 90d supply, fill #0
  Filled 2022-01-26: qty 180, 90d supply, fill #1
  Filled 2022-01-29: qty 180, 90d supply, fill #0
  Filled 2022-04-25: qty 180, 90d supply, fill #1
  Filled 2022-07-24: qty 180, 90d supply, fill #2

## 2021-11-01 MED ORDER — LEVOTHYROXINE SODIUM 25 MCG PO TABS
ORAL_TABLET | Freq: Every day | ORAL | 1 refills | Status: DC
  Filled 2021-11-01: qty 45, 90d supply, fill #0
  Filled 2022-01-26: qty 45, 90d supply, fill #1
  Filled 2022-01-29: qty 45, 90d supply, fill #0

## 2021-11-01 MED ORDER — ATORVASTATIN CALCIUM 20 MG PO TABS
ORAL_TABLET | Freq: Every day | ORAL | 3 refills | Status: DC
  Filled 2021-11-01: qty 90, 90d supply, fill #0
  Filled 2022-01-26: qty 90, 90d supply, fill #1
  Filled 2022-01-29: qty 90, 90d supply, fill #0
  Filled 2022-04-25: qty 90, 90d supply, fill #1
  Filled 2022-07-22: qty 90, 90d supply, fill #2

## 2021-11-02 ENCOUNTER — Other Ambulatory Visit: Payer: Self-pay

## 2021-11-10 ENCOUNTER — Other Ambulatory Visit: Payer: Self-pay

## 2021-11-13 ENCOUNTER — Telehealth: Payer: Self-pay | Admitting: Pharmacy Technician

## 2021-11-13 NOTE — Telephone Encounter (Signed)
Received updated proof of income.  Patient eligible to receive medication assistance at [facility] until time for re-certification in 2024, and as long as eligibility requirements continue to be met. ? ?Erin Wagner J. Brandun Pinn ?Care Manager ?[facility]  ?

## 2021-12-01 ENCOUNTER — Other Ambulatory Visit: Payer: Self-pay

## 2021-12-05 ENCOUNTER — Other Ambulatory Visit: Payer: Self-pay

## 2021-12-31 ENCOUNTER — Other Ambulatory Visit: Payer: Self-pay | Admitting: Family Medicine

## 2021-12-31 ENCOUNTER — Other Ambulatory Visit: Payer: Self-pay

## 2022-01-01 ENCOUNTER — Other Ambulatory Visit: Payer: Self-pay

## 2022-01-01 MED ORDER — SERTRALINE HCL 25 MG PO TABS
ORAL_TABLET | Freq: Every day | ORAL | 1 refills | Status: DC
  Filled 2022-01-01 – 2022-01-29 (×3): qty 90, 90d supply, fill #0

## 2022-01-26 ENCOUNTER — Other Ambulatory Visit: Payer: Self-pay | Admitting: Family Medicine

## 2022-01-26 MED ORDER — METFORMIN HCL 500 MG PO TABS
ORAL_TABLET | Freq: Two times a day (BID) | ORAL | 1 refills | Status: DC
  Filled 2022-01-26: qty 180, fill #0
  Filled 2022-01-29: qty 180, 90d supply, fill #0
  Filled 2022-04-25: qty 180, 90d supply, fill #1

## 2022-01-26 MED ORDER — PROPRANOLOL HCL 20 MG PO TABS
ORAL_TABLET | Freq: Two times a day (BID) | ORAL | 1 refills | Status: DC
  Filled 2022-01-26: qty 180, fill #0
  Filled 2022-01-29: qty 180, 90d supply, fill #0
  Filled 2022-04-25: qty 180, 90d supply, fill #1

## 2022-01-26 NOTE — Telephone Encounter (Signed)
Requested Prescriptions  Pending Prescriptions Disp Refills  . metFORMIN (GLUCOPHAGE) 500 MG tablet 180 tablet 1    Sig: TAKE ONE TABLET BY MOUTH 2 TIMES A DAY WITH A MEAL     Endocrinology:  Diabetes - Biguanides Failed - 01/26/2022 11:13 AM      Failed - B12 Level in normal range and within 720 days    No results found for: "VITAMINB12"       Failed - CBC within normal limits and completed in the last 12 months    WBC  Date Value Ref Range Status  07/29/2020 11.4 (H) 4.0 - 10.5 K/uL Final   RBC  Date Value Ref Range Status  07/29/2020 4.10 3.87 - 5.11 MIL/uL Final   Hemoglobin  Date Value Ref Range Status  07/29/2020 11.2 (L) 12.0 - 15.0 g/dL Final  06/27/2017 12.1 11.1 - 15.9 g/dL Final   HCT  Date Value Ref Range Status  07/29/2020 34.3 (L) 36.0 - 46.0 % Final   Hematocrit  Date Value Ref Range Status  06/27/2017 37.4 34.0 - 46.6 % Final   MCHC  Date Value Ref Range Status  07/29/2020 32.7 30.0 - 36.0 g/dL Final   [facility]  Date Value Ref Range Status  07/29/2020 27.3 26.0 - 34.0 pg Final   MCV  Date Value Ref Range Status  07/29/2020 83.7 80.0 - 100.0 fL Final  06/27/2017 88 79 - 97 fL Final   No results found for: "PLTCOUNTKUC", "LABPLAT", "POCPLA" RDW  Date Value Ref Range Status  07/29/2020 13.1 11.5 - 15.5 % Final  06/27/2017 13.3 12.3 - 15.4 % Final         Passed - Cr in normal range and within 360 days    Creatinine, Ser  Date Value Ref Range Status  08/21/2021 0.91 0.57 - 1.00 mg/dL Final         Passed - HBA1C is between 0 and 7.9 and within 180 days    Hgb A1c MFr Bld  Date Value Ref Range Status  08/21/2021 6.4 (H) 4.8 - 5.6 % Final    Comment:             Prediabetes: 5.7 - 6.4          Diabetes: >6.4          Glycemic control for adults with diabetes: <7.0          Passed - eGFR in normal range and within 360 days    GFR calc Af Amer  Date Value Ref Range Status  01/18/2020 57 (L) >60 mL/min Final   GFR, Estimated  Date Value Ref  Range Status  07/29/2020 60 (L) >60 mL/min Final    Comment:    (NOTE) Calculated using the CKD-EPI Creatinine Equation (2021)    eGFR  Date Value Ref Range Status  08/21/2021 63 >59 mL/min/1.73 Final         Passed - Valid encounter within last 6 months    Recent Outpatient Visits          5 months ago Type 2 diabetes mellitus with other specified complication, without long-term current use of insulin ([place])   [facility] [place], Dionne Bucy, MD   1 year ago Type 2 diabetes mellitus with other specified complication, without long-term current use of insulin [facility])   [place], Dionne Bucy, MD   1 year ago Type 2 diabetes mellitus with other specified complication, without long-term current use of insulin ([place])  [facility] [place], Dionne Bucy, MD   2 years ago Essential hypertension   [facility] [place], Dionne Bucy, MD   2 years ago Essential hypertension   [place], Dionne Bucy, MD      Future Appointments            In 3 weeks [place], Dionne Bucy, MD [facility]           . propranolol (INDERAL) 20 MG tablet 180 tablet 1    Sig: TAKE ONE TABLET BY MOUTH 2 TIMES A DAY     Cardiovascular:  Beta Blockers Passed - 01/26/2022 11:13 AM      Passed - Last BP in normal range    BP Readings from Last 1 Encounters:  08/21/21 (!) 124/57         Passed - Last Heart Rate in normal range    Pulse Readings from Last 1 Encounters:  08/21/21 81         Passed - Valid encounter within last 6 months    Recent Outpatient Visits          5 months ago Type 2 diabetes mellitus with other specified complication, without long-term current use of insulin ([place])   [facility] [place], Dionne Bucy, MD   1 year ago Type 2 diabetes mellitus with other specified complication, without long-term current use of insulin [facility])   [facility]  [place], Dionne Bucy, MD   1 year ago Type 2 diabetes mellitus with other specified complication, without long-term current use of insulin [facility])   [facility] [place], Dionne Bucy, MD   2 years ago Essential hypertension   [facility] [place], Dionne Bucy, MD   2 years ago Essential hypertension   [place], Dionne Bucy, MD      Future Appointments            In 3 weeks [place], Dionne Bucy, MD [facility]

## 2022-01-29 ENCOUNTER — Other Ambulatory Visit: Payer: Self-pay

## 2022-01-30 ENCOUNTER — Other Ambulatory Visit: Payer: Self-pay

## 2022-02-20 NOTE — Progress Notes (Unsigned)
I,Sulibeya S Dimas,acting as a Education administrator for Gwyneth Sprout, FNP.,have documented all relevant documentation on the behalf of Gwyneth Sprout, FNP,as directed by  Gwyneth Sprout, FNP while in the presence of Gwyneth Sprout, FNP.   Established patient visit   Patient: Erin Wagner   DOB: 1940-09-25   81 y.o. Female  MRN: [id number] Visit Date: 02/22/2022  Today's healthcare provider: Gwyneth Sprout, FNP  Introduced to nurse practitioner role and practice setting.  All questions answered.  Discussed provider/patient relationship and expectations.   Chief Complaint  Patient presents with   Diabetes   Hypertension   Hyperlipidemia   Subjective    HPI  Diabetes Mellitus Type II, follow-up  Lab Results  Component Value Date   HGBA1C 6.4 (A) 02/22/2022   HGBA1C 6.4 (H) 08/21/2021   HGBA1C 6.7 (A) 11/15/2020   Last seen for diabetes 6 months ago.  Management since then includes continuing the same treatment. She reports excellent compliance with treatment. She is not having side effects.  Home blood sugar records:  not being checked  Episodes of hypoglycemia? No    Current insulin regiment: none Most Recent Eye Exam: reports done in [place] last December.   --------------------------------------------------------------------------------------------------- Hypertension, follow-up  BP Readings from Last 3 Encounters:  02/22/22 122/60  08/21/21 (!) 124/57  11/15/20 115/72   Wt Readings from Last 3 Encounters:  02/22/22 107 lb 14.4 oz (48.9 kg)  08/21/21 106 lb 14.4 oz (48.5 kg)  08/11/21 105 lb 3.2 oz (47.7 kg)     She was last seen for hypertension 6 months ago.  BP at that visit was 124/57. Management since that visit includes continue medication. She reports excellent compliance with treatment. She is not having side effects.  She is exercising. She is not adherent to low salt diet.   Outside blood pressures are not being checked.  She does not  smoke.  Use of agents associated with hypertension: none.   --------------------------------------------------------------------------------------------------- Lipid/Cholesterol, follow-up  Last Lipid Panel: Lab Results  Component Value Date   CHOL 137 08/21/2021   LDLCALC 55 08/21/2021   HDL 43 08/21/2021   TRIG 246 (H) 08/21/2021    She was last seen for this 6 months ago.  Management since that visit includes continue medication.  She reports excellent compliance with treatment. She is not having side effects.   Symptoms: No appetite changes No foot ulcerations  No chest pain No chest pressure/discomfort  No dyspnea No orthopnea  No fatigue No lower extremity edema  No palpitations No paroxysmal nocturnal dyspnea  No nausea No numbness or tingling of extremity  No polydipsia No polyuria  No speech difficulty No syncope   She is following a Regular diet. Current exercise: walking  Last metabolic panel Lab Results  Component Value Date   GLUCOSE 94 08/21/2021   NA 131 (L) 08/21/2021   K 4.5 08/21/2021   BUN 15 08/21/2021   CREATININE 0.91 08/21/2021   EGFR 63 08/21/2021   GFRNONAA 60 (L) 07/29/2020   CALCIUM 9.6 08/21/2021   AST 22 08/21/2021   ALT 13 08/21/2021   The ASCVD Risk score (Arnett DK, et al., 2019) failed to calculate for the following reasons:   The 2019 ASCVD risk score is only valid for ages 49 to 83  ---------------------------------------------------------------------------------------------------   Medications: Outpatient Medications Prior to Visit  Medication Sig   atorvastatin (LIPITOR) 20 MG tablet TAKE ONE TABLET BY MOUTH ONCE EVERY DAY.   Calcium  Carbonate-Vitamin D 600-400 MG-UNIT tablet Take 1 tablet by mouth daily.    famotidine (PEPCID) 20 MG tablet TAKE ONE TABLET BY MOUTH 2 TIMES A DAY   ibuprofen (ADVIL) 400 MG tablet Take 1 tablet (400 mg total) by mouth every 6 (six) hours as needed.   levothyroxine (SYNTHROID) 25 MCG  tablet TAKE 1/2 TABLET BY MOUTH ONCE DAILY.   metFORMIN (GLUCOPHAGE) 500 MG tablet TAKE ONE TABLET BY MOUTH 2 TIMES A DAY WITH A MEAL   Multiple Vitamin (MULTIVITAMIN) tablet Take 1 tablet by mouth daily.   propranolol (INDERAL) 20 MG tablet TAKE ONE TABLET BY MOUTH 2 TIMES A DAY   sertraline (ZOLOFT) 25 MG tablet TAKE ONE TABLET BY MOUTH EVERY DAY   tamoxifen (NOLVADEX) 20 MG tablet TAKE ONE TABLET BY MOUTH ONCE EVERY DAY.   No facility-administered medications prior to visit.    Review of Systems  Constitutional:  Negative for appetite change and fatigue.  Respiratory:  Negative for chest tightness and shortness of breath.   Cardiovascular:  Negative for chest pain, palpitations and leg swelling.  Gastrointestinal:  Negative for abdominal pain, diarrhea, nausea and vomiting.        Objective    BP 122/60 (BP Location: Left Arm, Cuff Size: Normal) Comment: Simultaneous filing. User may not have seen previous data.  Pulse 73   Temp 98 F (36.7 C) (Oral)   Resp 16   Wt 107 lb 14.4 oz (48.9 kg)   BMI 27.01 kg/m  BP Readings from Last 3 Encounters:  02/22/22 122/60  08/21/21 (!) 124/57  11/15/20 115/72   Wt Readings from Last 3 Encounters:  02/22/22 107 lb 14.4 oz (48.9 kg)  08/21/21 106 lb 14.4 oz (48.5 kg)  08/11/21 105 lb 3.2 oz (47.7 kg)      Physical Exam Vitals and nursing note reviewed.  Constitutional:      General: She is not in acute distress.    Appearance: Normal appearance. She is overweight. She is not ill-appearing, toxic-appearing or diaphoretic.  HENT:     Head: Normocephalic and atraumatic.  Cardiovascular:     Rate and Rhythm: Normal rate and regular rhythm.     Pulses: Normal pulses.     Heart sounds: Normal heart sounds. No murmur heard.    No friction rub. No gallop.  Pulmonary:     Effort: Pulmonary effort is normal. No respiratory distress.     Breath sounds: Normal breath sounds. No stridor. No wheezing, rhonchi or rales.  Chest:     Chest  wall: No tenderness.  Abdominal:     General: Bowel sounds are normal.     Palpations: Abdomen is soft.  Musculoskeletal:        General: No swelling, tenderness, deformity or signs of injury. Normal range of motion.     Right lower leg: No edema.     Left lower leg: No edema.  Skin:    General: Skin is warm and dry.     Capillary Refill: Capillary refill takes less than 2 seconds.     Coloration: Skin is not jaundiced or pale.     Findings: No bruising, erythema, lesion or rash.  Neurological:     General: No focal deficit present.     Mental Status: She is alert and oriented to person, place, and time. Mental status is at baseline.     Cranial Nerves: No cranial nerve deficit.     Sensory: No sensory deficit.     Motor: No  weakness.     Coordination: Coordination normal.  Psychiatric:        Mood and Affect: Mood normal.        Behavior: Behavior normal.        Thought Content: Thought content normal.        Judgment: Judgment normal.      Results for orders placed or performed in visit on 02/22/22  POCT glycosylated hemoglobin (Hb A1C)  Result Value Ref Range   Hemoglobin A1C 6.4 (A) 4.0 - 5.6 %   Est. average glucose Bld gHb Est-mCnc 137     Assessment & Plan     Problem List Items Addressed This Visit       Cardiovascular and Mediastinum   Hypertension associated with diabetes ([place])    Chronic, stable At goal, <130/<80 Continue propranolol 20 mg BID       Relevant Orders   Comprehensive metabolic panel   CBC with Differential/Platelet     Endocrine   Diabetes mellitus ([place]) - Primary    Chronic, stable At A1c goal <7% Continue to recommend balanced, lower carb meals. Smaller meal size, adding snacks. Choosing water as drink of choice and increasing purposeful exercise. Continue 500 mg Metformin BID      Relevant Orders   POCT glycosylated hemoglobin (Hb A1C) (Completed)   Hyperlipidemia associated with type 2 diabetes mellitus (HCC)    Chronic,  stable At goal previously; repeat LP I recommend diet low in saturated fat and regular exercise - 30 min at least 5 times per week Son reports pt occ walks; however, not consistent       Relevant Orders   Lipid panel     Musculoskeletal and Integument   Localized 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 fracture    Chronic, worsening Discussed use of Calcium and Vit D supplements Trial of fosamax to assist       Relevant Medications   alendronate (FOSAMAX) 70 MG tablet     Return in about 6 months (around 08/25/2022) for annual examination.      Vonna Kotyk, FNP, have reviewed all documentation for this visit. The documentation on 02/22/22 for the exam, diagnosis, procedures, and orders are all accurate and complete.    Gwyneth Sprout, [place] [phone] (phone) [phone] (fax)  [place]

## 2022-02-22 ENCOUNTER — Ambulatory Visit (INDEPENDENT_AMBULATORY_CARE_PROVIDER_SITE_OTHER): Payer: Self-pay | Admitting: Family Medicine

## 2022-02-22 ENCOUNTER — Ambulatory Visit
Admission: RE | Admit: 2022-02-22 | Discharge: 2022-02-22 | Disposition: A | Discharge status: Home / Self Care | Payer: Self-pay | Source: Ambulatory Visit | Attending: Oncology | Admitting: Oncology

## 2022-02-22 ENCOUNTER — Other Ambulatory Visit: Payer: Self-pay

## 2022-02-22 ENCOUNTER — Encounter: Payer: Self-pay | Admitting: Family Medicine

## 2022-02-22 VITALS — BP 122/60 | HR 73 | Temp 98.0°F | Resp 16 | Wt 107.9 lb

## 2022-02-22 DIAGNOSIS — Z853 Personal history of malignant neoplasm of breast: Secondary | ICD-10-CM | POA: Insufficient documentation

## 2022-02-22 DIAGNOSIS — E785 Hyperlipidemia, unspecified: Secondary | ICD-10-CM

## 2022-02-22 DIAGNOSIS — M816 Localized osteoporosis [Lequesne]: Secondary | ICD-10-CM

## 2022-02-22 DIAGNOSIS — Z5181 Encounter for therapeutic drug level monitoring: Secondary | ICD-10-CM | POA: Insufficient documentation

## 2022-02-22 DIAGNOSIS — E1159 Type 2 diabetes mellitus with other circulatory complications: Secondary | ICD-10-CM

## 2022-02-22 DIAGNOSIS — E1169 Type 2 diabetes mellitus with other specified complication: Secondary | ICD-10-CM

## 2022-02-22 DIAGNOSIS — Z79810 Long term (current) use of selective estrogen receptor modulators (SERMs): Secondary | ICD-10-CM | POA: Insufficient documentation

## 2022-02-22 DIAGNOSIS — I152 Hypertension secondary to endocrine disorders: Secondary | ICD-10-CM

## 2022-02-22 LAB — POCT GLYCOSYLATED HEMOGLOBIN (HGB A1C)
Est. average glucose Bld gHb Est-mCnc: 137
Hemoglobin A1C: 6.4 % — AB (ref 4.0–5.6)

## 2022-02-22 MED ORDER — ALENDRONATE SODIUM 70 MG PO TABS
70.0000 mg | ORAL_TABLET | ORAL | 11 refills | Status: DC
Start: 2022-02-22 — End: 2023-02-04
  Filled 2022-02-22: qty 4, 28d supply, fill #0
  Filled 2022-03-18: qty 4, 28d supply, fill #1
  Filled 2022-04-15: qty 4, 28d supply, fill #2
  Filled 2022-05-13: qty 4, 28d supply, fill #3
  Filled 2022-06-06: qty 4, 28d supply, fill #4
  Filled 2022-07-05: qty 12, 84d supply, fill #5
  Filled 2022-10-13: qty 12, 84d supply, fill #6
  Filled 2022-12-31: qty 4, 28d supply, fill #7

## 2022-02-22 NOTE — Assessment & Plan Note (Signed)
Chronic, stable At A1c goal <7% Continue to recommend balanced, lower carb meals. Smaller meal size, adding snacks. Choosing water as drink of choice and increasing purposeful exercise. Continue 500 mg Metformin BID

## 2022-02-22 NOTE — Patient Instructions (Signed)
Borderline softness, osteopenia/osteoporosis. Can use Vit D 800 IU and Calcium 1200 mg to assist regular exercise for bone health. Can repeat DEXA in 3 years if desired. Recommend start of fosamax.

## 2022-02-22 NOTE — Assessment & Plan Note (Signed)
Chronic, stable At goal, <130/<80 Continue propranolol 20 mg BID

## 2022-02-22 NOTE — Assessment & Plan Note (Signed)
Chronic, stable At goal previously; repeat LP I recommend diet low in saturated fat and regular exercise - 30 min at least 5 times per week Son reports pt occ walks; however, not consistent

## 2022-02-22 NOTE — Assessment & Plan Note (Signed)
Chronic, worsening Discussed use of Calcium and Vit D supplements Trial of fosamax to assist

## 2022-02-23 LAB — CBC WITH DIFFERENTIAL/PLATELET
Basophils Absolute: 0.1 10*3/uL (ref 0.0–0.2)
Basos: 1 %
EOS (ABSOLUTE): 0.5 10*3/uL — ABNORMAL HIGH (ref 0.0–0.4)
Eos: 4 %
Hematocrit: 32.3 % — ABNORMAL LOW (ref 34.0–46.6)
Hemoglobin: 10.4 g/dL — ABNORMAL LOW (ref 11.1–15.9)
Immature Grans (Abs): 0 10*3/uL (ref 0.0–0.1)
Immature Granulocytes: 0 %
Lymphocytes Absolute: 3.3 10*3/uL — ABNORMAL HIGH (ref 0.7–3.1)
Lymphs: 29 %
MCH: 25.7 pg — ABNORMAL LOW (ref 26.6–33.0)
MCHC: 32.2 g/dL (ref 31.5–35.7)
MCV: 80 fL (ref 79–97)
Monocytes Absolute: 1.1 10*3/uL — ABNORMAL HIGH (ref 0.1–0.9)
Monocytes: 9 %
Neutrophils Absolute: 6.4 10*3/uL (ref 1.4–7.0)
Neutrophils: 57 %
Platelets: 275 10*3/uL (ref 150–450)
RBC: 4.05 x10E6/uL (ref 3.77–5.28)
RDW: 13.1 % (ref 11.7–15.4)
WBC: 11.3 10*3/uL — ABNORMAL HIGH (ref 3.4–10.8)

## 2022-02-23 LAB — LIPID PANEL
Chol/HDL Ratio: 3.2 ratio (ref 0.0–4.4)
Cholesterol, Total: 130 mg/dL (ref 100–199)
HDL: 41 mg/dL (ref >39)
LDL Chol Calc (NIH): 42 mg/dL (ref 0–99)
Triglycerides: 313 mg/dL — ABNORMAL HIGH (ref 0–149)
VLDL Cholesterol Cal: 47 mg/dL — ABNORMAL HIGH (ref 5–40)

## 2022-02-23 LAB — COMPREHENSIVE METABOLIC PANEL
ALT: 15 IU/L (ref 0–32)
AST: 20 IU/L (ref 0–40)
Albumin/Globulin Ratio: 1.4 (ref 1.2–2.2)
Albumin: 4.2 g/dL (ref 3.7–4.7)
Alkaline Phosphatase: 35 IU/L — ABNORMAL LOW (ref 44–121)
BUN/Creatinine Ratio: 17 (ref 12–28)
BUN: 17 mg/dL (ref 8–27)
Bilirubin Total: <0.2 mg/dL (ref 0.0–1.2)
CO2: 21 mmol/L (ref 20–29)
Calcium: 10.1 mg/dL (ref 8.7–10.3)
Chloride: 97 mmol/L (ref 96–106)
Creatinine, Ser: 1.01 mg/dL — ABNORMAL HIGH (ref 0.57–1.00)
Globulin, Total: 3 g/dL (ref 1.5–4.5)
Glucose: 98 mg/dL (ref 70–99)
Potassium: 5.1 mmol/L (ref 3.5–5.2)
Sodium: 133 mmol/L — ABNORMAL LOW (ref 134–144)
Total Protein: 7.2 g/dL (ref 6.0–8.5)
eGFR: 56 mL/min/{1.73_m2} — ABNORMAL LOW (ref >59)

## 2022-02-23 NOTE — Progress Notes (Signed)
Creatinine is elevated from 6 months ago; decrease in kidney function. Ensure water is primary drink- goal of 64 oz/day.   Cholesterol is stable; however, elevated fats in blood, triglycerides/ not uncommon for non fasting labs.  Borderline anemia- low hemoglobin and hematocrit. Ensure iron rich meals.  Gwyneth Sprout, [address] [place], [place] [address] [phone] (phone) [phone] (fax) [place]

## 2022-02-26 ENCOUNTER — Ambulatory Visit: Payer: Self-pay | Admitting: Oncology

## 2022-03-02 ENCOUNTER — Ambulatory Visit: Payer: Self-pay | Admitting: Oncology

## 2022-03-19 ENCOUNTER — Other Ambulatory Visit: Payer: Self-pay

## 2022-03-28 ENCOUNTER — Ambulatory Visit: Payer: Self-pay | Admitting: Oncology

## 2022-04-01 ENCOUNTER — Other Ambulatory Visit: Payer: Self-pay

## 2022-04-02 ENCOUNTER — Other Ambulatory Visit: Payer: Self-pay

## 2022-04-16 ENCOUNTER — Other Ambulatory Visit: Payer: Self-pay

## 2022-04-20 ENCOUNTER — Encounter: Payer: Self-pay | Admitting: Oncology

## 2022-04-20 ENCOUNTER — Inpatient Hospital Stay: Payer: Medicaid Other | Attending: Oncology | Admitting: Oncology

## 2022-04-20 VITALS — BP 127/58 | HR 86 | Temp 96.8°F | Resp 16 | Wt 109.3 lb

## 2022-04-20 DIAGNOSIS — E785 Hyperlipidemia, unspecified: Secondary | ICD-10-CM | POA: Insufficient documentation

## 2022-04-20 DIAGNOSIS — C50912 Malignant neoplasm of unspecified site of left female breast: Secondary | ICD-10-CM | POA: Diagnosis present

## 2022-04-20 DIAGNOSIS — Z853 Personal history of malignant neoplasm of breast: Secondary | ICD-10-CM

## 2022-04-20 DIAGNOSIS — I10 Essential (primary) hypertension: Secondary | ICD-10-CM | POA: Insufficient documentation

## 2022-04-20 DIAGNOSIS — C50911 Malignant neoplasm of unspecified site of right female breast: Secondary | ICD-10-CM | POA: Diagnosis not present

## 2022-04-20 DIAGNOSIS — Z79810 Long term (current) use of selective estrogen receptor modulators (SERMs): Secondary | ICD-10-CM

## 2022-04-20 DIAGNOSIS — Z08 Encounter for follow-up examination after completed treatment for malignant neoplasm: Secondary | ICD-10-CM

## 2022-04-20 DIAGNOSIS — E079 Disorder of thyroid, unspecified: Secondary | ICD-10-CM | POA: Insufficient documentation

## 2022-04-20 DIAGNOSIS — E119 Type 2 diabetes mellitus without complications: Secondary | ICD-10-CM | POA: Diagnosis not present

## 2022-04-20 DIAGNOSIS — Z79899 Other long term (current) drug therapy: Secondary | ICD-10-CM | POA: Diagnosis not present

## 2022-04-20 DIAGNOSIS — Z7984 Long term (current) use of oral hypoglycemic drugs: Secondary | ICD-10-CM | POA: Insufficient documentation

## 2022-04-20 DIAGNOSIS — Z5181 Encounter for therapeutic drug level monitoring: Secondary | ICD-10-CM | POA: Diagnosis not present

## 2022-04-20 DIAGNOSIS — M81 Age-related 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 fracture: Secondary | ICD-10-CM

## 2022-04-22 NOTE — Progress Notes (Signed)
Hematology/Oncology Consult note [facility]  [phone] [phone]  Patient Care Team: Virginia Crews, MD as PCP - General (Family Medicine)   Name of the patient: Erin Wagner  [id number]  [birth year]   Date of visit: 04/22/22  Diagnosis- h/o b/l breast cancer treated in [place] currently on tamoxifen  Chief complaint/ Reason for visit-routine follow-up of breast cancer  Heme/Onc history: Patient is a 81 year old [place] speaking female from [place] with a past medical history significant for hypertension hyperlipidemia, diabetes and bilateral breast cancer.  She is here with son and daughter in law. She has refused formal language interpretor and would like her family to interpret for her. Her family is comfortable speaking in Hindi with me and translating for her.   Her oncology history is as follows:   1.  She was diagnosed with bilateral breast cancer in July 2017 in [place].  She initially received neoadjuvant chemotherapy with 3 cycles of epirubicin and cyclophosphamide.  This was followed by bilateral modified radical mastectomy.  I do not have the actual pathology report from [place].  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  I am unable to decipher the written handwriting well.  Appears to have 1 out of 13 lymph nodes positive with PNE positive, no LV I or perineural invasion.  Left breast mass showed 6 cm mixed infiltrating ductal and lobular histology.  Again handwritten notes mention 3 out of 16 lymph nodes positive.  In some other page on the outside report, the right breast cancer has been mentioned as T2 N0 in the left breast cancer has been mentioned as T4N0.   2.  Following surgery she had 1 cycle of epirubicin and cyclophosphamide and 2 cycles of doxorubicin.  She then had IMRT to her bilateral breasts over 5  weeks.  She was then asked to take tamoxifen for 6 months only.  There is no mention about ER PR and HER-2 status anywhere in the outside reports. Patient was asked to take tamoxifen for 6 months following radiation and then stop it.   3. 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  Patient had a bone density scan in July 2019 which showed significant osteoporosis.  Since definitive evidence of ER status was not obtainable, given history of bilateral node positive breast cancer and was to proceed with tamoxifen assuming that it was ER PR positive.  Tamoxifen 20 mg started July 2019.  Fosamax weekly started for osteoporosis.  She also had CT chest abdomen and pelvis as well as bone scan which did not reveal any evidence of malignancy.  Focally increased activity in the left second rib to the presence of subacute fracture.  Interval history-patient is here with her son today.  Overall she is doing well for her age.  No recent falls or hospitalizations.  Denies any arm pain  ECOG PS- 1 Pain scale- 0   Review of systems- Review of Systems  Constitutional:  Negative for chills, fever, malaise/fatigue and weight loss.  HENT:  Negative for congestion, ear discharge and nosebleeds.   Eyes:  Negative for blurred vision.  Respiratory:  Negative for cough, hemoptysis, sputum production, shortness of breath and wheezing.   Cardiovascular:  Negative for chest pain,  palpitations, orthopnea and claudication.  Gastrointestinal:  Negative for abdominal pain, blood in stool, constipation, diarrhea, heartburn, melena, nausea and vomiting.  Genitourinary:  Negative for dysuria, flank pain, frequency, hematuria and urgency.  Musculoskeletal:  Negative for back pain, joint pain and myalgias.  Skin:  Negative for rash.  Neurological:  Negative for dizziness, tingling, focal  weakness, seizures, weakness and headaches.  Endo/Heme/Allergies:  Does not bruise/bleed easily.  Psychiatric/Behavioral:  Negative for depression and suicidal ideas. The patient does not have insomnia.       No Known Allergies   Past Medical History:  Diagnosis Date   Cancer (HCC)    breast   Cataract    Diabetes mellitus without complication (HCC)    Hyperlipidemia    Hypertension    Thyroid disease      Past Surgical History:  Procedure Laterality Date   BREAST SURGERY     EYE SURGERY      Social History   Socioeconomic History   Marital status: Widowed    Spouse name: Not on file   Number of children: 4   Years of education: Not on file   Highest education level: Not on file  Occupational History    Employer: retired    Comment: homemaker  Tobacco Use   Smoking status: Never   Smokeless tobacco: Never  Vaping Use   Vaping Use: Never used  Substance and Sexual Activity   Alcohol use: No   Drug use: No   Sexual activity: Not on file  Other Topics Concern   Not on file  Social History Narrative   Not on file   Social Determinants of Health   Financial Resource Strain: Not on file  Food Insecurity: Not on file  Transportation Needs: Not on file  Physical Activity: Insufficiently Active (06/11/2018)   Exercise Vital Sign    Days of Exercise per Week: 3 days    Minutes of Exercise per Session: 20 min  Stress: Not on file  Social Connections: Not on file  Intimate Partner Violence: Not on file    Family History  Problem Relation Age of Onset   Healthy Sister    Healthy Brother    Healthy Daughter    Healthy Sister    Healthy Daughter    Healthy Daughter      Current Outpatient Medications:    alendronate (FOSAMAX) 70 MG tablet, Take 1 tablet (70 mg total) by mouth every 7 (seven) days. Take with a full glass of water on an empty stomach., Disp: 4 tablet, Rfl: 11   atorvastatin (LIPITOR) 20 MG tablet, TAKE ONE TABLET BY MOUTH ONCE EVERY DAY.,  Disp: 90 tablet, Rfl: 3   Calcium Carbonate-Vitamin D 600-400 MG-UNIT tablet, Take 1 tablet by mouth daily. , Disp: , Rfl:    famotidine (PEPCID) 20 MG tablet, TAKE ONE TABLET BY MOUTH 2 TIMES A DAY, Disp: 180 tablet, Rfl: 3   ibuprofen (ADVIL) 400 MG tablet, Take 1 tablet (400 mg total) by mouth every 6 (six) hours as needed., Disp: 30 tablet, Rfl: 0   levothyroxine (SYNTHROID) 25 MCG tablet, TAKE 1/2 TABLET BY MOUTH ONCE DAILY., Disp: 45 tablet, Rfl: 1   metFORMIN (GLUCOPHAGE) 500 MG tablet, TAKE ONE TABLET BY MOUTH 2 TIMES A DAY WITH A MEAL, Disp: 180 tablet, Rfl: 1   Multiple Vitamin (MULTIVITAMIN) tablet, Take 1 tablet by mouth daily., Disp: , Rfl:    propranolol (INDERAL) 20 MG tablet, TAKE ONE TABLET BY MOUTH 2 TIMES A DAY, Disp:   180 tablet, Rfl: 1   sertraline (ZOLOFT) 25 MG tablet, TAKE ONE TABLET BY MOUTH EVERY DAY, Disp: 90 tablet, Rfl: 1   tamoxifen (NOLVADEX) 20 MG tablet, TAKE ONE TABLET BY MOUTH ONCE EVERY DAY., Disp: 90 tablet, Rfl: 3  Physical exam:  Vitals:   04/20/22 1513  BP: (!) 127/58  Pulse: 86  Resp: 16  Temp: (!) 96.8 F (36 C)  SpO2: 100%  Weight: 109 lb 4.8 oz (49.6 kg)   Physical Exam Constitutional:      General: She is not in acute distress. Cardiovascular:     Rate and Rhythm: Normal rate and regular rhythm.     Heart sounds: Normal heart sounds.  Pulmonary:     Effort: Pulmonary effort is normal.     Breath sounds: Normal breath sounds.  Abdominal:     General: Bowel sounds are normal.     Palpations: Abdomen is soft.  Skin:    General: Skin is warm and dry.  Neurological:     Mental Status: She is alert and oriented to person, place, and time.   Chest wall exam: Patient is s/p bilateral mastectomy without reconstruction.  No evidence of chest wall recurrence.  No palpable bilateral axillary adenopathy.     Latest Ref Rng & Units 02/22/2022    4:17 PM  CMP  Glucose 70 - 99 mg/dL 98   BUN 8 - 27 mg/dL 17   Creatinine 0.57 - 1.00 mg/dL 1.01    Sodium 134 - 144 mmol/L 133   Potassium 3.5 - 5.2 mmol/L 5.1   Chloride 96 - 106 mmol/L 97   CO2 20 - 29 mmol/L 21   Calcium 8.7 - 10.3 mg/dL 10.1   Total Protein 6.0 - 8.5 g/dL 7.2   Total Bilirubin 0.0 - 1.2 mg/dL <0.2   Alkaline Phos 44 - 121 IU/L 35   AST 0 - 40 IU/L 20   ALT 0 - 32 IU/L 15       Latest Ref Rng & Units 02/22/2022    4:17 PM  CBC  WBC 3.4 - 10.8 x10E3/uL 11.3   Hemoglobin 11.1 - 15.9 g/dL 10.4   Hematocrit 34.0 - 46.6 % 32.3   Platelets 150 - 450 x10E3/uL 275     Assessment and plan- Patient is a 80 y.o. female with bilateral breast cancer (stage is unclear and ER PR and her 2 neu status is also unclear) s/p neoadjuvant chemotherapy followed bilateral mastectomy without reconstruction, adjuvant chemotherapy, RT and 6 months of tamoxifen in 2017.  Patient is currently on tamoxifen which was restarted in 2019.  This is a routine follow-up visit  Clinically patient is doing well with no signs and symptoms of recurrence based on today's exam.  Given that she had bilateral high risk breast cancer requiring neoadjuvant chemotherapy and given that her overall performance status continues to be good I would recommend that she should take endocrine therapy for 5 additional years.  She is completing 5 years at this time.  I did get a repeat bone density scan which does show osteoporosis with a T score of -2.5 at the left femur neck.  She has been started on weekly Fosamax by her primary care doctor which she will continue.  Given that she is now more than 5 years since the diagnosis of her breast cancer I will see her on a yearly basis   Visit Diagnosis 1. Encounter for follow-up surveillance of breast cancer   2. Age-related osteoporosis without   current pathological fracture   3. Encounter for monitoring tamoxifen therapy      Dr. Archana Rao, MD, MPH [facility] [phone] 04/22/2022 8:32 PM                

## 2022-04-25 ENCOUNTER — Other Ambulatory Visit: Payer: Self-pay | Admitting: Family Medicine

## 2022-04-26 ENCOUNTER — Other Ambulatory Visit: Payer: Self-pay

## 2022-04-26 MED ORDER — LEVOTHYROXINE SODIUM 25 MCG PO TABS
ORAL_TABLET | Freq: Every day | ORAL | 1 refills | Status: DC
  Filled 2022-04-26: qty 45, 90d supply, fill #0
  Filled 2022-07-22: qty 45, 90d supply, fill #1

## 2022-04-28 ENCOUNTER — Encounter: Payer: Self-pay | Admitting: Family Medicine

## 2022-04-30 ENCOUNTER — Other Ambulatory Visit: Payer: Self-pay

## 2022-04-30 MED ORDER — NIRMATRELVIR/RITONAVIR (PAXLOVID) TABLET (RENAL DOSING)
3.0000 | ORAL_TABLET | Freq: Two times a day (BID) | ORAL | 0 refills | Status: AC
  Filled 2022-04-30: qty 20, 5d supply, fill #0
  Filled 2022-04-30: qty 30, 5d supply, fill #0

## 2022-05-14 ENCOUNTER — Other Ambulatory Visit: Payer: Self-pay

## 2022-06-06 ENCOUNTER — Other Ambulatory Visit: Payer: Self-pay

## 2022-06-12 ENCOUNTER — Encounter: Payer: Self-pay | Admitting: Physician Assistant

## 2022-06-12 ENCOUNTER — Ambulatory Visit (INDEPENDENT_AMBULATORY_CARE_PROVIDER_SITE_OTHER): Payer: Medicaid Other | Admitting: Physician Assistant

## 2022-06-12 VITALS — BP 132/54 | HR 82 | Temp 98.6°F | Wt 107.3 lb

## 2022-06-12 DIAGNOSIS — R059 Cough, unspecified: Secondary | ICD-10-CM | POA: Diagnosis not present

## 2022-06-12 DIAGNOSIS — R0989 Other specified symptoms and signs involving the circulatory and respiratory systems: Secondary | ICD-10-CM

## 2022-06-12 NOTE — Progress Notes (Unsigned)
I,Sha'taria Tyson,acting as a Education administrator for [employer], PA-C.,have documented all relevant documentation on the behalf of Mardene Speak, PA-C,as directed by  [employer], PA-C while in the presence of [employer], PA-C.   Established patient visit   Patient: Erin Wagner   DOB: 12/21/1940   81 y.o. Female  MRN: [id number] Visit Date: 06/12/2022  Today's healthcare provider: Mardene Speak, PA-C  CC: cough  Subjective    HPI  Patient son whom is healthcare power of attorney is present to interpret for patient today. Declined interpretation services.  Patient is being seen today due to cough with phlegm (clear or yellow at time) and congestion for 15 days now. No fever to report and no contact with anyone sick that patient is aware of. Patient reports to be taken robitussin with mild relief. Originally started with nyquil and dayquil for 3 days and then switched.  2 mo ago , pt had a covid-19, and was treated with paxlovid Medications: Outpatient Medications Prior to Visit  Medication Sig   alendronate (FOSAMAX) 70 MG tablet Take 1 tablet (70 mg total) by mouth every 7 (seven) days. Take with a full glass of water on an empty stomach.   atorvastatin (LIPITOR) 20 MG tablet TAKE ONE TABLET BY MOUTH ONCE EVERY DAY.   Calcium Carbonate-Vitamin D 600-400 MG-UNIT tablet Take 1 tablet by mouth daily.    famotidine (PEPCID) 20 MG tablet TAKE ONE TABLET BY MOUTH 2 TIMES A DAY   ibuprofen (ADVIL) 400 MG tablet Take 1 tablet (400 mg total) by mouth every 6 (six) hours as needed.   levothyroxine (SYNTHROID) 25 MCG tablet TAKE 1/2 TABLET BY MOUTH ONCE DAILY.   metFORMIN (GLUCOPHAGE) 500 MG tablet TAKE ONE TABLET BY MOUTH 2 TIMES A DAY WITH A MEAL   Multiple Vitamin (MULTIVITAMIN) tablet Take 1 tablet by mouth daily.   propranolol (INDERAL) 20 MG tablet TAKE ONE TABLET BY MOUTH 2 TIMES A DAY   sertraline (ZOLOFT) 25 MG tablet TAKE ONE TABLET BY MOUTH EVERY DAY   tamoxifen  (NOLVADEX) 20 MG tablet TAKE ONE TABLET BY MOUTH ONCE EVERY DAY.   No facility-administered medications prior to visit.    Review of Systems  HENT:  Positive for congestion, postnasal drip and rhinorrhea. Negative for ear discharge, ear pain, facial swelling, sinus pressure, sinus pain and sore throat.   Respiratory:  Positive for cough. Negative for chest tightness, shortness of breath and wheezing.   Cardiovascular:  Negative for chest pain, palpitations and leg swelling.    {Labs  Heme  Chem  Endocrine  Serology  Results Review (optional):23779}   Objective    BP (!) 132/54 (BP Location: Left Arm, Patient Position: Sitting, Cuff Size: Normal)   Pulse 82   Temp 98.6 F (37 C) (Oral)   Wt 107 lb 4.8 oz (48.7 kg)   SpO2 100%   BMI 26.86 kg/m  {Show previous vital signs (optional):23777}  Physical Exam Vitals reviewed.  Constitutional:      General: She is not in acute distress.    Appearance: Normal appearance. She is well-developed. She is not diaphoretic.  HENT:     Head: Normocephalic and atraumatic.     Right Ear: Ear canal and external ear normal. There is impacted cerumen (with hearing loss/finger rub test).     Left Ear: Tympanic membrane, ear canal and external ear normal. There is no impacted cerumen.     Nose: Congestion and rhinorrhea present.  Mouth/Throat:     Pharynx: No oropharyngeal exudate or posterior oropharyngeal erythema.  Eyes:     General: No scleral icterus.       Right eye: No discharge.        Left eye: No discharge.     Extraocular Movements: Extraocular movements intact.     Conjunctiva/sclera: Conjunctivae normal.     Pupils: Pupils are equal, round, and reactive to light.  Neck:     Thyroid: No thyromegaly.  Cardiovascular:     Rate and Rhythm: Normal rate and regular rhythm.     Pulses: Normal pulses.     Heart sounds: Normal heart sounds. No murmur heard. Pulmonary:     Effort: Pulmonary effort is normal. No respiratory  distress.     Breath sounds: Rales (R>L) present. No wheezing or rhonchi.  Musculoskeletal:        General: Normal range of motion.     Cervical back: Normal range of motion and neck supple. No rigidity or tenderness.     Right lower leg: No edema.     Left lower leg: No edema.  Lymphadenopathy:     Cervical: Cervical adenopathy present.  Skin:    General: Skin is warm and dry.     Findings: No rash.  Neurological:     Mental Status: She is alert and oriented to person, place, and time. Mental status is at baseline.  Psychiatric:        Behavior: Behavior normal.        Thought Content: Thought content normal.        Judgment: Judgment normal.     No results found for any visits on 06/12/22.  Assessment & Plan     1. Abnormal lung sounds 2. Cough, unspecified type  Could be due to URI, bronchitis, pneumonia Advised symptomatic treatment including .  Rx Doxycycline.  Increase fluids.  Rest.  Saline nasal spray.  Probiotic.  Mucinex as directed.  Humidifier in bedroom. Flonase per orders.  Call or return to clinic if symptoms are not improving.   - doxycycline (VIBRAMYCIN) 100 MG capsule; Take 1 capsule (100 mg total) by mouth 2 (two) times daily.  Dispense: 20 capsule; Refill: 0 - fluticasone (FLONASE) 50 MCG/ACT nasal spray; Place 2 sprays into both nostrils daily.  Dispense: 16 g; Refill: 0    - DG Chest 2 View; Future to rule out bronchitis and pneumonia Uptodate with pneumococcal vaccine  The patient was advised to call back or seek an in-person evaluation if the symptoms worsen or if the condition fails to improve as anticipated.  I discussed the assessment and treatment plan with the patient. The patient was provided an opportunity to ask questions and all were answered. The patient agreed with the plan and demonstrated an understanding of the instructions.  The entirety of the information documented in the History of Present Illness, Review of Systems and Physical  Exam were personally obtained by me. Portions of this information were initially documented by the CMA and reviewed by me for thoroughness and accuracy.   Mardene Speak, [facility], [place] [phone] (phone) [phone] (fax)

## 2022-06-13 ENCOUNTER — Other Ambulatory Visit: Payer: Self-pay

## 2022-06-13 ENCOUNTER — Ambulatory Visit (INDEPENDENT_AMBULATORY_CARE_PROVIDER_SITE_OTHER): Payer: Medicaid Other | Admitting: Family Medicine

## 2022-06-13 ENCOUNTER — Encounter: Payer: Self-pay | Admitting: Family Medicine

## 2022-06-13 VITALS — BP 130/55 | HR 81 | Resp 16 | Wt 107.0 lb

## 2022-06-13 DIAGNOSIS — B9689 Other specified bacterial agents as the cause of diseases classified elsewhere: Secondary | ICD-10-CM | POA: Diagnosis not present

## 2022-06-13 DIAGNOSIS — J208 Acute bronchitis due to other specified organisms: Secondary | ICD-10-CM

## 2022-06-13 MED ORDER — AZITHROMYCIN 500 MG PO TABS
500.0000 mg | ORAL_TABLET | Freq: Every day | ORAL | 0 refills | Status: DC
  Filled 2022-06-13: qty 7, 7d supply, fill #0

## 2022-06-13 MED ORDER — PREDNISONE 10 MG PO TABS
10.0000 mg | ORAL_TABLET | Freq: Every day | ORAL | 0 refills | Status: DC
  Filled 2022-06-13: qty 3, 3d supply, fill #0

## 2022-06-13 MED ORDER — BREZTRI AEROSPHERE 160-9-4.8 MCG/ACT IN AERO: 2.0000 | INHALATION_SPRAY | Freq: Two times a day (BID) | RESPIRATORY_TRACT | 11 refills | Status: DC

## 2022-06-13 NOTE — Progress Notes (Signed)
Established patient visit   Patient: Erin Wagner   DOB: 06/22/1941   81 y.o. Female  MRN: [id number] Visit Date: 06/13/2022  Today's healthcare provider: Gwyneth Sprout, FNP   I,Tiffany J Bragg,acting as a scribe for Gwyneth Sprout, FNP.,have documented all relevant documentation on the behalf of Gwyneth Sprout, FNP,as directed by  Gwyneth Sprout, FNP while in the presence of Gwyneth Sprout, FNP.   Chief Complaint  Patient presents with   Cough    Patient complains of cough and congestion for 2 weeks. Had covid a month ago, took meds and symptoms went away, now cough came back.    Subjective    HPI HPI     Cough    Additional comments: Patient complains of cough and congestion for 2 weeks. Had covid a month ago, took meds and symptoms went away, now cough came back.       Last edited by Smitty Knudsen, CMA on 06/13/2022  2:33 PM.      Medications: Outpatient Medications Prior to Visit  Medication Sig   alendronate (FOSAMAX) 70 MG tablet Take 1 tablet (70 mg total) by mouth every 7 (seven) days. Take with a full glass of water on an empty stomach.   atorvastatin (LIPITOR) 20 MG tablet TAKE ONE TABLET BY MOUTH ONCE EVERY DAY.   Calcium Carbonate-Vitamin D 600-400 MG-UNIT tablet Take 1 tablet by mouth daily.    famotidine (PEPCID) 20 MG tablet TAKE ONE TABLET BY MOUTH 2 TIMES A DAY   ibuprofen (ADVIL) 400 MG tablet Take 1 tablet (400 mg total) by mouth every 6 (six) hours as needed.   levothyroxine (SYNTHROID) 25 MCG tablet TAKE 1/2 TABLET BY MOUTH ONCE DAILY.   metFORMIN (GLUCOPHAGE) 500 MG tablet TAKE ONE TABLET BY MOUTH 2 TIMES A DAY WITH A MEAL   Multiple Vitamin (MULTIVITAMIN) tablet Take 1 tablet by mouth daily.   propranolol (INDERAL) 20 MG tablet TAKE ONE TABLET BY MOUTH 2 TIMES A DAY   sertraline (ZOLOFT) 25 MG tablet TAKE ONE TABLET BY MOUTH EVERY DAY   tamoxifen (NOLVADEX) 20 MG tablet TAKE ONE TABLET BY MOUTH ONCE EVERY DAY.   No  facility-administered medications prior to visit.    Review of Systems   Objective    BP (!) 130/55 (BP Location: Left Arm, Patient Position: Sitting, Cuff Size: Normal)   Pulse 81   Resp 16   Wt 107 lb (48.5 kg)   SpO2 100%   BMI 26.78 kg/m   Physical Exam Vitals and nursing note reviewed.  Constitutional:      General: She is not in acute distress.    Appearance: Normal appearance. She is overweight. She is not ill-appearing, toxic-appearing or diaphoretic.  HENT:     Head: Normocephalic and atraumatic.  Cardiovascular:     Rate and Rhythm: Normal rate and regular rhythm.     Pulses: Normal pulses.     Heart sounds: Normal heart sounds. No murmur heard.    No friction rub. No gallop.  Pulmonary:     Effort: Pulmonary effort is normal. No respiratory distress.     Breath sounds: No stridor. Examination of the right-lower field reveals rhonchi. Examination of the left-lower field reveals rhonchi. Rhonchi present. No wheezing or rales.  Chest:     Chest wall: No tenderness.  Abdominal:     General: Bowel sounds are normal.     Palpations: Abdomen is soft.  Musculoskeletal:  General: No swelling, tenderness, deformity or signs of injury. Normal range of motion.     Right lower leg: No edema.     Left lower leg: No edema.  Skin:    General: Skin is warm and dry.     Capillary Refill: Capillary refill takes less than 2 seconds.     Coloration: Skin is not jaundiced or pale.     Findings: No bruising, erythema, lesion or rash.     Comments: Tenting noted on bilateral posterior hands; oral intake encouraged   Neurological:     General: No focal deficit present.     Mental Status: She is alert and oriented to person, place, and time. Mental status is at baseline.     Cranial Nerves: No cranial nerve deficit.     Sensory: No sensory deficit.     Motor: No weakness.     Coordination: Coordination normal.  Psychiatric:        Mood and Affect: Mood normal.         Behavior: Behavior normal.        Thought Content: Thought content normal.        Judgment: Judgment normal.     No results found for any visits on 06/13/22.  Assessment & Plan     Problem List Items Addressed This Visit       Respiratory   Acute bacterial bronchitis - Primary    S/p COVID treated with Paxlovid and additional unknown URI Denies travel or sick contacts Unable to use Robitussin DM s/s nausea CXR ordered 12/5 Recommend use of abx/steroid combo with trial of breztri to assist Continue to push fluids, use of alternative treatments like steam showers, hot water bottles etc RTC prn       Relevant Medications   azithromycin (ZITHROMAX) 500 MG tablet   predniSONE (DELTASONE) 10 MG tablet   Budeson-Glycopyrrol-Formoterol (BREZTRI AEROSPHERE) 160-9-4.8 MCG/ACT AERO   Return if symptoms worsen or fail to improve.     Vonna Kotyk, FNP, have reviewed all documentation for this visit. The documentation on 06/13/22 for the exam, diagnosis, procedures, and orders are all accurate and complete.  Gwyneth Sprout, [place] [phone] (phone) [phone] (fax)  [place]

## 2022-06-13 NOTE — Assessment & Plan Note (Signed)
S/p COVID treated with Paxlovid and additional unknown URI Denies travel or sick contacts Unable to use Robitussin DM s/s nausea CXR ordered 12/5 Recommend use of abx/steroid combo with trial of breztri to assist Continue to push fluids, use of alternative treatments like steam showers, hot water bottles etc RTC prn

## 2022-06-14 ENCOUNTER — Telehealth: Payer: Self-pay | Admitting: Physician Assistant

## 2022-06-14 NOTE — Telephone Encounter (Signed)
Please contact her son and ask if he was able to do CXR?  Please ask him if his mother is doing better on symptomatic treatment

## 2022-06-24 ENCOUNTER — Other Ambulatory Visit: Payer: Self-pay

## 2022-06-25 ENCOUNTER — Other Ambulatory Visit: Payer: Self-pay

## 2022-06-28 ENCOUNTER — Other Ambulatory Visit: Payer: Self-pay | Admitting: Family Medicine

## 2022-06-28 ENCOUNTER — Other Ambulatory Visit: Payer: Self-pay

## 2022-06-28 MED ORDER — SERTRALINE HCL 25 MG PO TABS
ORAL_TABLET | Freq: Every day | ORAL | 0 refills | Status: DC
  Filled 2022-06-28: qty 90, 90d supply, fill #0

## 2022-07-06 ENCOUNTER — Other Ambulatory Visit: Payer: Self-pay

## 2022-07-22 ENCOUNTER — Other Ambulatory Visit: Payer: Self-pay | Admitting: Family Medicine

## 2022-07-23 ENCOUNTER — Other Ambulatory Visit: Payer: Self-pay

## 2022-07-23 MED ORDER — METFORMIN HCL 500 MG PO TABS
500.0000 mg | ORAL_TABLET | Freq: Two times a day (BID) | ORAL | 11 refills | Status: DC
  Filled 2022-07-23: qty 60, 30d supply, fill #0
  Filled 2022-08-18: qty 60, 30d supply, fill #1

## 2022-07-23 MED ORDER — PROPRANOLOL HCL 20 MG PO TABS
20.0000 mg | ORAL_TABLET | Freq: Two times a day (BID) | ORAL | 1 refills | Status: DC
  Filled 2022-07-23: qty 180, 90d supply, fill #0

## 2022-08-03 LAB — HM DIABETES EYE EXAM

## 2022-08-09 ENCOUNTER — Encounter: Payer: Self-pay | Admitting: Family Medicine

## 2022-08-18 ENCOUNTER — Other Ambulatory Visit: Payer: Self-pay | Admitting: Oncology

## 2022-08-19 MED ORDER — TAMOXIFEN CITRATE 20 MG PO TABS
ORAL_TABLET | Freq: Every day | ORAL | 3 refills | Status: DC
  Filled 2022-08-19: qty 90, 90d supply, fill #0

## 2022-08-20 ENCOUNTER — Other Ambulatory Visit: Payer: Self-pay

## 2022-08-23 ENCOUNTER — Other Ambulatory Visit: Payer: Self-pay

## 2022-08-27 ENCOUNTER — Ambulatory Visit (INDEPENDENT_AMBULATORY_CARE_PROVIDER_SITE_OTHER): Payer: Medicaid Other | Admitting: Family Medicine

## 2022-08-27 ENCOUNTER — Ambulatory Visit
Admission: RE | Admit: 2022-08-27 | Discharge: 2022-08-27 | Disposition: A | Discharge status: Home / Self Care | Payer: Medicaid Other | Source: Ambulatory Visit | Attending: Family Medicine | Admitting: Family Medicine

## 2022-08-27 ENCOUNTER — Other Ambulatory Visit: Payer: Self-pay

## 2022-08-27 ENCOUNTER — Ambulatory Visit
Admission: RE | Admit: 2022-08-27 | Discharge: 2022-08-27 | Disposition: A | Discharge status: Home / Self Care | Payer: Medicaid Other | Attending: Family Medicine | Admitting: Family Medicine

## 2022-08-27 ENCOUNTER — Encounter: Payer: Self-pay | Admitting: Family Medicine

## 2022-08-27 VITALS — BP 135/50 | HR 84 | Temp 97.8°F | Resp 20 | Wt 107.0 lb

## 2022-08-27 DIAGNOSIS — Z0000 Encounter for general adult medical examination without abnormal findings: Secondary | ICD-10-CM

## 2022-08-27 DIAGNOSIS — E1159 Type 2 diabetes mellitus with other circulatory complications: Secondary | ICD-10-CM

## 2022-08-27 DIAGNOSIS — E039 Hypothyroidism, unspecified: Secondary | ICD-10-CM | POA: Diagnosis not present

## 2022-08-27 DIAGNOSIS — E1169 Type 2 diabetes mellitus with other specified complication: Secondary | ICD-10-CM | POA: Diagnosis not present

## 2022-08-27 DIAGNOSIS — E785 Hyperlipidemia, unspecified: Secondary | ICD-10-CM

## 2022-08-27 DIAGNOSIS — I152 Hypertension secondary to endocrine disorders: Secondary | ICD-10-CM

## 2022-08-27 DIAGNOSIS — R0989 Other specified symptoms and signs involving the circulatory and respiratory systems: Secondary | ICD-10-CM

## 2022-08-27 DIAGNOSIS — Z23 Encounter for immunization: Secondary | ICD-10-CM | POA: Diagnosis not present

## 2022-08-27 MED ORDER — SERTRALINE HCL 25 MG PO TABS
25.0000 mg | ORAL_TABLET | Freq: Every day | ORAL | 1 refills | Status: DC
  Filled 2022-08-27: qty 90, fill #0
  Filled 2022-09-16: qty 90, 90d supply, fill #0
  Filled 2022-12-31: qty 90, 90d supply, fill #1

## 2022-08-27 MED ORDER — LEVOTHYROXINE SODIUM 25 MCG PO TABS
25.0000 ug | ORAL_TABLET | Freq: Every day | ORAL | 1 refills | Status: DC
  Filled 2022-08-27: qty 45, fill #0
  Filled 2022-10-13: qty 45, 45d supply, fill #0
  Filled 2022-12-31: qty 45, 45d supply, fill #1

## 2022-08-27 MED ORDER — PROPRANOLOL HCL 20 MG PO TABS
20.0000 mg | ORAL_TABLET | Freq: Two times a day (BID) | ORAL | 1 refills | Status: DC
  Filled 2022-08-27 – 2022-10-13 (×2): qty 180, 90d supply, fill #0
  Filled 2023-01-16: qty 180, 90d supply, fill #1
  Filled 2023-01-24: qty 150, 75d supply, fill #1
  Filled 2023-01-24 (×2): qty 30, 15d supply, fill #1
  Filled 2023-01-24: qty 150, 75d supply, fill #1
  Filled 2023-01-24: qty 180, 90d supply, fill #1

## 2022-08-27 MED ORDER — ATORVASTATIN CALCIUM 20 MG PO TABS
20.0000 mg | ORAL_TABLET | Freq: Every day | ORAL | 3 refills | Status: DC
  Filled 2022-08-27: qty 90, fill #0
  Filled 2022-10-13: qty 90, 90d supply, fill #0
  Filled 2023-01-16 – 2023-01-24 (×3): qty 90, 90d supply, fill #1
  Filled 2023-04-21: qty 90, 90d supply, fill #2

## 2022-08-27 MED ORDER — METFORMIN HCL 500 MG PO TABS
500.0000 mg | ORAL_TABLET | Freq: Two times a day (BID) | ORAL | 1 refills | Status: DC
  Filled 2022-08-27 – 2022-09-16 (×2): qty 180, 90d supply, fill #0
  Filled 2022-12-15: qty 180, 90d supply, fill #1

## 2022-08-27 NOTE — Assessment & Plan Note (Signed)
New finding on exam with cough No LE edema Will check CXR Treatment pending results

## 2022-08-27 NOTE — Assessment & Plan Note (Signed)
Previously well controlled Continue Synthroid at current dose  Recheck TSH and adjust Synthroid as indicated   

## 2022-08-27 NOTE — Assessment & Plan Note (Signed)
Well controlled Continue current medications Recheck metabolic panel F/u in 6 months  

## 2022-08-27 NOTE — Assessment & Plan Note (Signed)
Well controlled with last A1c - recheck today Continue current medications UTD on vaccines, eye exam, foot exam today Uacr today On Statin Discussed diet and exercise F/u in 6 months

## 2022-08-27 NOTE — Assessment & Plan Note (Signed)
Previously well controlled Continue statin Repeat FLP and CMP Goal LDL < 70 - consider decreasing statin dose if LDL too low

## 2022-08-27 NOTE — Progress Notes (Signed)
I,Sulibeya S Dimas,acting as a Education administrator for Erin Paganini, MD.,have documented all relevant documentation on the behalf of Erin Paganini, MD,as directed by  Erin Paganini, MD while in the presence of Erin Paganini, MD.    Annual Wellness Visit     Patient: Erin Wagner, Female    DOB: 10-20-1940, 82 y.o.   MRN: [id number] Visit Date: 08/27/2022  Today's Provider: Lavon Paganini, MD   Chief Complaint  Patient presents with   Medicare Wellness   Subjective    Erin Wagner is a 82 y.o. female who presents today for her Annual Wellness Visit. She reports consuming a general diet. Home exercise routine includes walking. She generally feels well. She reports sleeping well. She does not have additional problems to discuss today.   HPI  Covid in 10/23, then better, then bronchitis in 12/23, then better. Now with cough again, non productive, no fever, no leg swelling, no SOB.  Medications: Outpatient Medications Prior to Visit  Medication Sig   alendronate (FOSAMAX) 70 MG tablet Take 1 tablet (70 mg total) by mouth every 7 (seven) days. Take with a full glass of water on an empty stomach.   Calcium Carbonate-Vitamin D 600-400 MG-UNIT tablet Take 1 tablet by mouth daily.    famotidine (PEPCID) 20 MG tablet TAKE ONE TABLET BY MOUTH 2 TIMES A DAY   ibuprofen (ADVIL) 400 MG tablet Take 1 tablet (400 mg total) by mouth every 6 (six) hours as needed.   Multiple Vitamin (MULTIVITAMIN) tablet Take 1 tablet by mouth daily.   tamoxifen (NOLVADEX) 20 MG tablet TAKE ONE TABLET BY MOUTH ONCE EVERY DAY.   [DISCONTINUED] atorvastatin (LIPITOR) 20 MG tablet TAKE ONE TABLET BY MOUTH ONCE EVERY DAY.   [DISCONTINUED] Budeson-Glycopyrrol-Formoterol (BREZTRI AEROSPHERE) 160-9-4.8 MCG/ACT AERO Inhale 2 puffs into the lungs 2 (two) times daily.   [DISCONTINUED] levothyroxine (SYNTHROID) 25 MCG tablet TAKE 1/2 TABLET BY MOUTH ONCE DAILY.   [DISCONTINUED] metFORMIN  (GLUCOPHAGE) 500 MG tablet Take 1 tablet (500 mg total) by mouth 2 (two) times daily with a meal.   [DISCONTINUED] predniSONE (DELTASONE) 10 MG tablet Take 1 tablet (10 mg total) by mouth daily with breakfast.   [DISCONTINUED] propranolol (INDERAL) 20 MG tablet Take 1 tablet (20 mg total) by mouth 2 (two) times daily.   [DISCONTINUED] sertraline (ZOLOFT) 25 MG tablet TAKE ONE TABLET BY MOUTH EVERY DAY   [DISCONTINUED] azithromycin (ZITHROMAX) 500 MG tablet Take 1 tablet (500 mg total) by mouth daily. (Patient not taking: Reported on 08/27/2022)   No facility-administered medications prior to visit.    No Known Allergies  Patient Care Team: Virginia Crews, MD as PCP - General (Family Medicine)  Review of Systems  All other systems reviewed and are negative.       Objective    Vitals: BP (!) 135/50 (BP Location: Right Arm, Patient Position: Sitting, Cuff Size: Normal)   Pulse 84   Temp 97.8 F (36.6 C) (Temporal)   Resp 20   Wt 107 lb (48.5 kg)   SpO2 100%   BMI 26.78 kg/m     Physical Exam Vitals reviewed.  Constitutional:      General: She is not in acute distress.    Appearance: Normal appearance. She is well-developed. She is not diaphoretic.  HENT:     Head: Normocephalic and atraumatic.     Right Ear: Tympanic membrane, ear canal and external ear normal.     Left Ear: Tympanic membrane, ear canal and external ear normal.  Nose: Nose normal.     Mouth/Throat:     Mouth: Mucous membranes are moist.     Pharynx: Oropharynx is clear.  Eyes:     General: No scleral icterus.    Conjunctiva/sclera: Conjunctivae normal.  Neck:     Thyroid: No thyromegaly.  Cardiovascular:     Rate and Rhythm: Normal rate and regular rhythm.     Pulses: Normal pulses.     Heart sounds: Normal heart sounds. No murmur heard. Pulmonary:     Effort: Pulmonary effort is normal. No respiratory distress.     Breath sounds: Rales (bibasilar) present. No wheezing or rhonchi.   Abdominal:     General: There is no distension.     Palpations: Abdomen is soft.     Tenderness: There is no abdominal tenderness.  Musculoskeletal:     Cervical back: Neck supple.     Right lower leg: No edema.     Left lower leg: No edema.  Lymphadenopathy:     Cervical: No cervical adenopathy.  Skin:    General: Skin is warm and dry.     Findings: No rash.  Neurological:     Mental Status: She is alert and oriented to person, place, and time. Mental status is at baseline.  Psychiatric:        Mood and Affect: Mood normal.        Behavior: Behavior normal.     Most recent functional status assessment:    08/27/2022    3:57 PM  In your present state of health, do you have any difficulty performing the following activities:  Hearing? 0  Vision? 0  Difficulty concentrating or making decisions? 0  Walking or climbing stairs? 0  Dressing or bathing? 0  Doing errands, shopping? 0   Most recent fall risk assessment:    08/27/2022    3:57 PM  [place] in the past year? 0  Number falls in past yr: 0  Injury with Fall? 0  Risk for fall due to : No Fall Risks  Follow up Falls evaluation completed    Most recent depression screenings:    08/27/2022    3:57 PM 02/22/2022    4:02 PM  PHQ 2/9 Scores  PHQ - 2 Score 0 0  PHQ- 9 Score 0 1   Most recent cognitive screening:     No data to display         Most recent Audit-C alcohol use screening    08/27/2022    3:57 PM  Alcohol Use Disorder Test (AUDIT)  1. How often do you have a drink containing alcohol? 0  2. How many drinks containing alcohol do you have on a typical day when you are drinking? 0  3. How often do you have six or more drinks on one occasion? 0  AUDIT-C Score 0   A score of 3 or more in women, and 4 or more in men indicates increased risk for alcohol abuse, EXCEPT if all of the points are from question 1   No results found for any visits on 08/27/22.  Assessment & Plan     Annual  wellness visit done today including the all of the following: Reviewed patient's Family Medical History Reviewed and updated list of patient's medical providers Assessment of cognitive impairment was done Assessed patient's functional ability Established a written schedule for health screening [place] Completed and Reviewed  Exercise Activities and Dietary recommendations  Goals  None     Immunization History  Administered Date(s) Administered   PFIZER(Purple Top)SARS-COV-2 Vaccination 07/29/2019, 08/20/2019, 04/26/2020   PNEUMOCOCCAL CONJUGATE-20 08/27/2022   Pneumococcal Polysaccharide-23 05/16/2020   Tdap 08/27/2022    Health Maintenance  Topic Date Due   Zoster Vaccines- Shingrix (1 of 2) Never done   COVID-19 Vaccine (4 - 2023-24 season) 03/09/2022   Diabetic kidney evaluation - Urine ACR  08/21/2022   HEMOGLOBIN A1C  08/25/2022   INFLUENZA VACCINE  10/07/2022 (Originally 02/06/2022)   Diabetic kidney evaluation - eGFR measurement  02/23/2023   OPHTHALMOLOGY EXAM  08/04/2023   FOOT EXAM  08/28/2023   DTaP/Tdap/Td (2 - Td or Tdap) 08/27/2032   Pneumonia Vaccine 80+ Years old  Completed   DEXA SCAN  Completed   HPV VACCINES  Aged Out     Discussed health benefits of physical activity, and encouraged her to engage in regular exercise appropriate for her age and condition.    Problem List Items Addressed This Visit       Cardiovascular and Mediastinum   Hypertension associated with diabetes ([place])    Well controlled Continue current medications Recheck metabolic panel F/u in 6 months       Relevant Medications   atorvastatin (LIPITOR) 20 MG tablet   metFORMIN (GLUCOPHAGE) 500 MG tablet   propranolol (INDERAL) 20 MG tablet   Other Relevant Orders   Comprehensive metabolic panel     Endocrine   Diabetes mellitus ([place])    Well controlled with last A1c - recheck today Continue current medications UTD on vaccines, eye exam, foot exam  today Uacr today On Statin Discussed diet and exercise F/u in 6 months       Relevant Medications   atorvastatin (LIPITOR) 20 MG tablet   metFORMIN (GLUCOPHAGE) 500 MG tablet   Other Relevant Orders   Hemoglobin A1c   Urine Microalbumin w/creat. ratio   Hyperlipidemia associated with type 2 diabetes mellitus (H[facility])    Previously well controlled Continue statin Repeat FLP and CMP Goal LDL < 70 - consider decreasing statin dose if LDL too low       Relevant Medications   atorvastatin (LIPITOR) 20 MG tablet   metFORMIN (GLUCOPHAGE) 500 MG tablet   propranolol (INDERAL) 20 MG tablet   Other Relevant Orders   Lipid panel   Comprehensive metabolic panel   Hypothyroidism    Previously well controlled Continue Synthroid at current dose  Recheck TSH and adjust Synthroid as indicated        Relevant Medications   levothyroxine (SYNTHROID) 25 MCG tablet   propranolol (INDERAL) 20 MG tablet   Other Relevant Orders   TSH     Other   Bibasilar crackles    New finding on exam with cough No LE edema Will check CXR Treatment pending results      Relevant Orders   DG Chest 2 View   Other Visit Diagnoses     Encounter for annual physical exam    -  Primary   Relevant Orders   Lipid panel   Comprehensive metabolic panel   Hemoglobin A1c   Urine Microalbumin w/creat. ratio   TSH   Need for pneumococcal 20-valent conjugate vaccination       Relevant Orders   Pneumococcal conjugate vaccine 20-valent (Completed)   Need for Tdap vaccination       Relevant Orders   Tdap vaccine greater than or equal to 7yo IM (Completed)        Return in about 6  months (around 02/25/2023) for chronic disease f/u.     I, Erin Paganini, MD, have reviewed all documentation for this visit. The documentation on 08/27/22 for the exam, diagnosis, procedures, and orders are all accurate and complete.   Kerryn Tennant, Dionne Bucy, MD, MPH [facility]

## 2022-08-28 ENCOUNTER — Other Ambulatory Visit: Payer: Self-pay

## 2022-08-28 DIAGNOSIS — I152 Hypertension secondary to endocrine disorders: Secondary | ICD-10-CM

## 2022-08-28 LAB — MICROALBUMIN / CREATININE URINE RATIO

## 2022-08-28 NOTE — Progress Notes (Signed)
  Normal/stable labs, except low sodium. It has decreased over last labs.  Recommend making sure she is hydrating well and eating a varied diet.  Recheck BMP for sodium in 1 month.

## 2022-08-31 ENCOUNTER — Telehealth: Payer: Self-pay

## 2022-08-31 DIAGNOSIS — R0989 Other specified symptoms and signs involving the circulatory and respiratory systems: Secondary | ICD-10-CM

## 2022-08-31 NOTE — Telephone Encounter (Signed)
Patient's son advised. Referral placed.

## 2022-08-31 NOTE — Telephone Encounter (Signed)
-----   Message from Virginia Crews, MD sent at 08/30/2022 11:16 AM EST ----- Some fluid or other scarring on chest XRay.  Doubt infection given that she is not better after antibiotics.  No swelling in her legs, so doubt fluid in the lungs. Recommend pulmonology referral for further evaluation.

## 2022-09-04 LAB — COMPREHENSIVE METABOLIC PANEL
ALT: 17 IU/L (ref 0–32)
AST: 24 IU/L (ref 0–40)
Albumin/Globulin Ratio: 1.5 (ref 1.2–2.2)
Albumin: 4.1 g/dL (ref 3.7–4.7)
Alkaline Phosphatase: 42 IU/L — ABNORMAL LOW (ref 44–121)
BUN/Creatinine Ratio: 18 (ref 12–28)
BUN: 17 mg/dL (ref 8–27)
Bilirubin Total: <0.2 mg/dL (ref 0.0–1.2)
CO2: 22 mmol/L (ref 20–29)
Calcium: 10 mg/dL (ref 8.7–10.3)
Chloride: 96 mmol/L (ref 96–106)
Creatinine, Ser: 0.96 mg/dL (ref 0.57–1.00)
Globulin, Total: 2.8 g/dL (ref 1.5–4.5)
Glucose: 115 mg/dL — ABNORMAL HIGH (ref 70–99)
Potassium: 4.4 mmol/L (ref 3.5–5.2)
Sodium: 130 mmol/L — ABNORMAL LOW (ref 134–144)
Total Protein: 6.9 g/dL (ref 6.0–8.5)
eGFR: 59 mL/min/{1.73_m2} — ABNORMAL LOW (ref >59)

## 2022-09-04 LAB — LIPID PANEL
Chol/HDL Ratio: 3.2 ratio (ref 0.0–4.4)
Cholesterol, Total: 126 mg/dL (ref 100–199)
HDL: 40 mg/dL (ref >39)
LDL Chol Calc (NIH): 43 mg/dL (ref 0–99)
Triglycerides: 283 mg/dL — ABNORMAL HIGH (ref 0–149)
VLDL Cholesterol Cal: 43 mg/dL — ABNORMAL HIGH (ref 5–40)

## 2022-09-04 LAB — MICROALBUMIN / CREATININE URINE RATIO

## 2022-09-04 LAB — HEMOGLOBIN A1C
Est. average glucose Bld gHb Est-mCnc: 143 mg/dL
Hgb A1c MFr Bld: 6.6 % — ABNORMAL HIGH (ref 4.8–5.6)

## 2022-09-04 LAB — TSH: TSH: 2.83 u[IU]/mL (ref 0.450–4.500)

## 2022-09-13 ENCOUNTER — Encounter: Payer: Self-pay | Admitting: Student in an Organized Health Care Education/Training Program

## 2022-09-13 ENCOUNTER — Other Ambulatory Visit: Payer: Self-pay

## 2022-09-13 ENCOUNTER — Ambulatory Visit (INDEPENDENT_AMBULATORY_CARE_PROVIDER_SITE_OTHER): Payer: Medicaid Other | Admitting: Student in an Organized Health Care Education/Training Program

## 2022-09-13 VITALS — BP 110/58 | HR 79 | Temp 97.5°F | Ht <= 58 in | Wt 106.6 lb

## 2022-09-13 DIAGNOSIS — R053 Chronic cough: Secondary | ICD-10-CM

## 2022-09-13 MED ORDER — LORATADINE 10 MG PO TABS
10.0000 mg | ORAL_TABLET | Freq: Every day | ORAL | 2 refills | Status: DC
  Filled 2022-09-13: qty 30, 30d supply, fill #0
  Filled 2022-10-13: qty 30, 30d supply, fill #1
  Filled 2022-11-13: qty 30, 30d supply, fill #2

## 2022-09-13 NOTE — Progress Notes (Signed)
Synopsis: Referred in for cough by Virginia Crews, MD  Assessment & Plan:   1. Chronic cough  Presents for the evaluation of a cough that appears to have exacerbated after a recent infection with COVID. History from the patient suggests bio-fuel exposure at a younger age which does increase her risk for the development of COPD. 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 I will obtain a pulmonary function test (spirometry, lung volumes, DLCO) to evaluate her for COPD and obtain a high resolution chest CT to evaluate for the development of any interstitial lung disease. Finally, will prescribe a second generation anti-histamine empirically to treat UACS.  - Pulmonary Function Test [facility] Only; Future - CT Chest High Resolution - loratadine (CLARITIN) 10 MG tablet; Take 1 tablet (10 mg total) by mouth daily.  Dispense: 30 tablet; Refill: 2   Return in about 2 months (around 11/13/2022).  I spent 60 minutes caring for this patient today, including preparing to see the patient, obtaining a medical history , reviewing a separately obtained history, performing a medically appropriate examination and/or evaluation, counseling and educating the patient/family/caregiver, ordering medications, tests, or procedures, documenting clinical information in the electronic health record, and independently interpreting results (not separately reported/billed) and communicating results to the patient/family/caregiver  Armando Reichert, MD [facility] 09/13/2022 4:11 PM    End of visit medications:  Meds ordered this encounter  Medications   loratadine (CLARITIN) 10 MG tablet    Sig: Take 1 tablet (10 mg total) by mouth daily.    Dispense:  30 tablet    Refill:  2     Current Outpatient Medications:    alendronate (FOSAMAX) 70 MG tablet, Take 1 tablet (70 mg total) by mouth every 7  (seven) days. Take with a full glass of water on an empty stomach., Disp: 4 tablet, Rfl: 11   atorvastatin (LIPITOR) 20 MG tablet, Take 1 tablet (20 mg total) by mouth daily., Disp: 90 tablet, Rfl: 3   Calcium Carbonate-Vitamin D 600-400 MG-UNIT tablet, Take 1 tablet by mouth daily. , Disp: , Rfl:    famotidine (PEPCID) 20 MG tablet, TAKE ONE TABLET BY MOUTH 2 TIMES A DAY, Disp: 180 tablet, Rfl: 3   levothyroxine (SYNTHROID) 25 MCG tablet, Take 1 tablet (25 mcg total) by mouth daily., Disp: 45 tablet, Rfl: 1   loratadine (CLARITIN) 10 MG tablet, Take 1 tablet (10 mg total) by mouth daily., Disp: 30 tablet, Rfl: 2   metFORMIN (GLUCOPHAGE) 500 MG tablet, Take 1 tablet (500 mg total) by mouth 2 (two) times daily with a meal., Disp: 180 tablet, Rfl: 1   Multiple Vitamin (MULTIVITAMIN) tablet, Take 1 tablet by mouth daily., Disp: , Rfl:    propranolol (INDERAL) 20 MG tablet, Take 1 tablet (20 mg total) by mouth 2 (two) times daily., Disp: 180 tablet, Rfl: 1   sertraline (ZOLOFT) 25 MG tablet, Take 1 tablet (25 mg total) by mouth daily., Disp: 90 tablet, Rfl: 1   tamoxifen (NOLVADEX) 20 MG tablet, TAKE ONE TABLET BY MOUTH ONCE EVERY DAY., Disp: 90 tablet, Rfl: 3   ibuprofen (ADVIL) 400 MG tablet, Take 1 tablet (400 mg total) by mouth every 6 (six) hours as needed. (Patient not taking: Reported on 09/13/2022), Disp: 30 tablet, Rfl: 0   Subjective:   PATIENT ID: Erin Wagner GENDER: female DOB: 1941/03/13, MRN: [id number]  Chief Complaint  Patient  presents with   pulmonary consult    Covid 05/2022- tx with antiviral. Cough returned 12/23 and 1/24 tx with zpak.  Prod cough with white sputum mainly in the morning.     HPI  82 year old female presenting to clinic for the evaluation of cough. Patient was interviewed with the help of a video Gujarati interpretor.  Patient had COVID-19 in November 2023 that was treated with Paxlovid with improvement. Following this, she developed a few bouts of  cough that responded to antibiotics.  She currently only reports a cough, mostly in the morning, productive of whitish sputum.  She does not have any shortness of breath, chest pain, fevers, chills, night sweats, weight loss, hemoptysis, GI, or GU symptoms.  She reports some chest tightness that occurs only when she coughs.  She does not have a wheeze.  She does not endorse any similar symptoms in the past and feels she was fairly healthy throughout her life.  She has not had any chest infections, any history of asthma, or any use of inhalers in the past.  She was prescribed an inhaler at some point over the past few months that she did not use.  Patient's other past medical history includes breast cancer status post bilateral mastectomy and chemoradiation.  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  Patient is originally from [place] where she was born and lived up until 6 years ago.  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  Patient reports using wood sticks for fuel for around 20 years of her life, after which she switched to [place] briefly and then kerosene/diesel.  She did not have any pet animals or farm animals in [place] nor has she kept any birds.  She moved in with her son 6 years ago in [place].  They have lived in the same house [facility]) for the past 60 years.  There is no basement, no water damage, and no mold in the house.  She does not have any history of working in a factory or any other exposures.  Her son does not have any pets or birds.  Ancillary information including prior medications, full medical/surgical/family/social histories, and PFTs (when available) are listed below and have been reviewed.   Review of Systems  Constitutional:  Negative for chills, fever, malaise/fatigue and weight loss.  Respiratory:  Positive for cough. Negative for hemoptysis, sputum production, shortness  of breath and wheezing.   Cardiovascular:  Negative for chest pain, leg swelling and PND.     Objective:   Vitals:   09/13/22 1536  BP: (!) 110/58  Pulse: 79  Temp: (!) 97.5 F (36.4 C)  SpO2: 96%  Weight: 106 lb 9.6 oz (48.4 kg)  Height: '4\' 5"'$  (1.346 m)   96% on RA  BMI Readings from Last 3 Encounters:  09/13/22 26.68 kg/m  08/27/22 26.78 kg/m  06/13/22 26.78 kg/m   Wt Readings from Last 3 Encounters:  09/13/22 106 lb 9.6 oz (48.4 kg)  08/27/22 107 lb (48.5 kg)  06/13/22 107 lb (48.5 kg)    Physical Exam Constitutional:      General: She is not in acute distress.    Appearance: Normal appearance. She is not ill-appearing.  HENT:     Head: Normocephalic.     Mouth/Throat:     Mouth: Mucous membranes are moist.  Cardiovascular:     Rate and Rhythm: Normal  rate and regular rhythm.     Pulses: Normal pulses.     Heart sounds: Normal heart sounds.  Pulmonary:     Effort: Pulmonary effort is normal. No respiratory distress.     Breath sounds: Normal breath sounds. No stridor. No wheezing, rhonchi or rales.  Abdominal:     Palpations: Abdomen is soft.  Neurological:     General: No focal deficit present.     Mental Status: She is alert and oriented to person, place, and time. Mental status is at baseline.     Ancillary Information    Past Medical History:  Diagnosis Date   Cancer [facility])    breast   Cataract    Diabetes mellitus without complication ([place])    Hyperlipidemia    Hypertension    Thyroid disease      Family History  Problem Relation Age of Onset   Healthy Sister    Healthy Brother    Healthy Daughter    Healthy Sister    Healthy Daughter    Healthy Daughter      Past Surgical History:  Procedure Laterality Date   BREAST SURGERY     EYE SURGERY      Social History   Socioeconomic History   Marital status: Widowed    Spouse name: Not on file   Number of children: 4   Years of education: Not on file   Highest education  level: Not on file  Occupational History    Employer: retired    Comment: homemaker  Tobacco Use   Smoking status: Never   Smokeless tobacco: Never  Vaping Use   Vaping Use: Never used  Substance and Sexual Activity   Alcohol use: No   Drug use: No   Sexual activity: Not on file  Other Topics Concern   Not on file  Social History Narrative   Not on file   Social Determinants of Health   Financial Resource Strain: Not on file  Food Insecurity: Not on file  Transportation Needs: Not on file  Physical Activity: Insufficiently Active (06/11/2018)   Exercise Vital Sign    Days of Exercise per Week: 3 days    Minutes of Exercise per Session: 20 min  Stress: Not on file  Social Connections: Not on file  Intimate Partner Violence: Not on file     No Known Allergies   CBC    Component Value Date/Time   WBC 11.3 (H) 02/22/2022 1617   WBC 11.4 (H) 07/29/2020 1440   RBC 4.05 02/22/2022 1617   RBC 4.10 07/29/2020 1440   HGB 10.4 (L) 02/22/2022 1617   HCT 32.3 (L) 02/22/2022 1617   PLT 275 02/22/2022 1617   MCV 80 02/22/2022 1617   MCH 25.7 (L) 02/22/2022 1617   MCH 27.3 07/29/2020 1440   MCHC 32.2 02/22/2022 1617   MCHC 32.7 07/29/2020 1440   RDW 13.1 02/22/2022 1617   LYMPHSABS 3.3 (H) 02/22/2022 1617   MONOABS 1.3 (H) 07/29/2020 1440   EOSABS 0.5 (H) 02/22/2022 1617   BASOSABS 0.1 02/22/2022 1617    Pulmonary Functions Testing Results:     No data to display          Outpatient Medications Prior to Visit  Medication Sig Dispense Refill   alendronate (FOSAMAX) 70 MG tablet Take 1 tablet (70 mg total) by mouth every 7 (seven) days. Take with a full glass of water on an empty stomach. 4 tablet 11   atorvastatin (LIPITOR) 20 MG tablet  Take 1 tablet (20 mg total) by mouth daily. 90 tablet 3   Calcium Carbonate-Vitamin D 600-400 MG-UNIT tablet Take 1 tablet by mouth daily.      famotidine (PEPCID) 20 MG tablet TAKE ONE TABLET BY MOUTH 2 TIMES A DAY 180 tablet 3    levothyroxine (SYNTHROID) 25 MCG tablet Take 1 tablet (25 mcg total) by mouth daily. 45 tablet 1   metFORMIN (GLUCOPHAGE) 500 MG tablet Take 1 tablet (500 mg total) by mouth 2 (two) times daily with a meal. 180 tablet 1   Multiple Vitamin (MULTIVITAMIN) tablet Take 1 tablet by mouth daily.     propranolol (INDERAL) 20 MG tablet Take 1 tablet (20 mg total) by mouth 2 (two) times daily. 180 tablet 1   sertraline (ZOLOFT) 25 MG tablet Take 1 tablet (25 mg total) by mouth daily. 90 tablet 1   tamoxifen (NOLVADEX) 20 MG tablet TAKE ONE TABLET BY MOUTH ONCE EVERY DAY. 90 tablet 3   ibuprofen (ADVIL) 400 MG tablet Take 1 tablet (400 mg total) by mouth every 6 (six) hours as needed. (Patient not taking: Reported on 09/13/2022) 30 tablet 0   No facility-administered medications prior to visit.

## 2022-09-16 ENCOUNTER — Other Ambulatory Visit: Payer: Self-pay

## 2022-09-25 ENCOUNTER — Ambulatory Visit: Payer: Medicaid Other

## 2022-09-25 ENCOUNTER — Ambulatory Visit
Admission: RE | Admit: 2022-09-25 | Discharge: 2022-09-25 | Disposition: A | Discharge status: Home / Self Care | Payer: Medicaid Other | Source: Ambulatory Visit | Attending: Student in an Organized Health Care Education/Training Program | Admitting: Student in an Organized Health Care Education/Training Program

## 2022-09-25 DIAGNOSIS — R053 Chronic cough: Secondary | ICD-10-CM | POA: Insufficient documentation

## 2022-10-14 ENCOUNTER — Other Ambulatory Visit: Payer: Self-pay

## 2022-10-16 ENCOUNTER — Ambulatory Visit: Payer: Medicaid Other | Admitting: Student in an Organized Health Care Education/Training Program

## 2022-11-05 ENCOUNTER — Encounter: Payer: Self-pay | Admitting: Student in an Organized Health Care Education/Training Program

## 2022-11-05 ENCOUNTER — Ambulatory Visit (INDEPENDENT_AMBULATORY_CARE_PROVIDER_SITE_OTHER): Payer: Medicaid Other | Admitting: Student in an Organized Health Care Education/Training Program

## 2022-11-05 ENCOUNTER — Other Ambulatory Visit: Payer: Self-pay

## 2022-11-05 VITALS — BP 130/50 | HR 95 | Temp 97.8°F | Ht <= 58 in | Wt 106.4 lb

## 2022-11-05 DIAGNOSIS — J849 Interstitial pulmonary disease, unspecified: Secondary | ICD-10-CM

## 2022-11-05 MED ORDER — PREDNISONE 5 MG PO TABS
20.0000 mg | ORAL_TABLET | Freq: Every day | ORAL | 0 refills | Status: DC
  Filled 2022-11-05: qty 168, 34d supply, fill #0
  Filled 2022-11-05: qty 60, 15d supply, fill #0

## 2022-11-05 NOTE — Patient Instructions (Addendum)
Today, I ordered blood work. You can get them draw at your preferred [facility] draw station. The nearest one to [facility] is at nearby [facility] ([address] [place], [place], [place] [address]).  Stop taking the sertraline. Continue taking tamoxifen for now, I will reach out to Dr. Smith Robert about it.  We will start prednisone Take 4 tablets (20 mg) daily for 3 weeks Then take 3 tablets (15 mg) daily for 2 weeks Then take 2 tablets (10 mg) daily for 2 weeks Then take 1 tablet (5 mg) daily for 2 weeks

## 2022-11-07 ENCOUNTER — Telehealth: Payer: Self-pay | Admitting: Student in an Organized Health Care Education/Training Program

## 2022-11-07 NOTE — Progress Notes (Signed)
Synopsis: Referred in for cough by Erasmo Downer, MD  Assessment & Plan:   1. ILD (interstitial lung disease) (HCC)  Presents for the evaluation of a cough that appears to have exacerbated after a recent infection with COVID. History from the patient suggests bio-fuel exposure at a younger age which does increase her risk for the development of COPD. 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 High resolution chest CT obtained 09/26/2022 shows progressive findings of subpleural reticulation and ground glass, with mosaic attenuation and airtrapping, that is suggestive of hypersensitivity pneumonitis. Fibrotic NSIP is also on the differential. Patient is still to have her pulmonary function test.   I did discuss these findings with them, and we went over the differential of hypersensitivity pneumonitis, NSIP, and drug induced pneumonitis. I reviewed her medication list and Sertraline and Tamoxifen are both associated with drug induced pneumonitis. I have asked her to stop Sertraline, and will reach out to patient's oncologist regarding the Tamoxifen, as she is close to the end of the 5 year mark - she will continue Tamoxifen while we clarify this question. I also explained to them that the diagnosis would be best obtained with biopsy, thou I am hesitant to have her undergo a wedge biopsy given her age. I am unsure if proceeding with bronchoscopy with airway survey and transbronchial biopsy would provide the diagnosis, and would rather not expose her to general anesthesia, especially if this will not change her overall management. I will obtain an auto-immune workup, included ANA, hypersensitivity, and myositis workup.  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 We will taper this over 9 weeks, after which I will see her in clinic for  follow up, and repeat imaging to assess her response.   - ANCA Profile - ANA 12 Plus Profile (RDL) - Anti-CCP Ab, IgG + IgA (RDL) - MyoMarker 3 Plus Profile (RDL) - Hypersensitivity Pneumonitis - Rheumatoid factor - CK - predniSONE (DELTASONE) 5 MG tablet; Take 4 tablets (20 mg total) by mouth daily with breakfast for 3 weeks. Then take 3 tablets (15 mg) daily for 2 weeks. Then take 2 tablets (10 mg) daily for 2 weeks. Then take 1 tablet (5 mg) daily for 2 weeks.  Dispense: 168 tablet; Refill: 0   Return in about 2 months (around 01/05/2023).  I spent 30 minutes caring for this patient today, including preparing to see the patient, obtaining a medical history , reviewing a separately obtained history, performing a medically appropriate examination and/or evaluation, counseling and educating the patient/family/caregiver, ordering medications, tests, or procedures, documenting clinical information in the electronic health record, and independently interpreting results (not separately reported/billed) and communicating results to the patient/family/caregiver  [name], MD [facility] 11/07/2022 10:46 AM    End of visit medications:  Meds ordered this encounter  Medications   predniSONE (DELTASONE) 5 MG tablet    Sig: Take 4 tablets (20 mg total) by mouth daily with breakfast for 3 weeks. Then take 3 tablets (15 mg) daily for 2 weeks. Then take 2 tablets (10 mg) daily for 2 weeks. Then take 1 tablet (5 mg) daily for 2 weeks.    Dispense:  168 tablet    Refill:  0    change to #168 tabs per Dr. Aundria Rud via secure chat 11/04/22 kjn     Current Outpatient Medications:  alendronate (FOSAMAX) 70 MG tablet, Take 1 tablet (70 mg total) by mouth every 7 (seven) days. Take with a full glass of water on an empty stomach., Disp: 4 tablet, Rfl: 11   atorvastatin (LIPITOR) 20 MG tablet, Take 1 tablet (20 mg total) by mouth daily., Disp: 90 tablet, Rfl: 3   Calcium  Carbonate-Vitamin D 600-400 MG-UNIT tablet, Take 1 tablet by mouth daily. , Disp: , Rfl:    ibuprofen (ADVIL) 400 MG tablet, Take 1 tablet (400 mg total) by mouth every 6 (six) hours as needed., Disp: 30 tablet, Rfl: 0   levothyroxine (SYNTHROID) 25 MCG tablet, Take 1 tablet (25 mcg total) by mouth daily., Disp: 45 tablet, Rfl: 1   loratadine (CLARITIN) 10 MG tablet, Take 1 tablet (10 mg total) by mouth daily., Disp: 30 tablet, Rfl: 2   metFORMIN (GLUCOPHAGE) 500 MG tablet, Take 1 tablet (500 mg total) by mouth 2 (two) times daily with a meal., Disp: 180 tablet, Rfl: 1   Multiple Vitamin (MULTIVITAMIN) tablet, Take 1 tablet by mouth daily., Disp: , Rfl:    predniSONE (DELTASONE) 5 MG tablet, Take 4 tablets (20 mg total) by mouth daily with breakfast for 3 weeks. Then take 3 tablets (15 mg) daily for 2 weeks. Then take 2 tablets (10 mg) daily for 2 weeks. Then take 1 tablet (5 mg) daily for 2 weeks., Disp: 168 tablet, Rfl: 0   propranolol (INDERAL) 20 MG tablet, Take 1 tablet (20 mg total) by mouth 2 (two) times daily., Disp: 180 tablet, Rfl: 1   sertraline (ZOLOFT) 25 MG tablet, Take 1 tablet (25 mg total) by mouth daily., Disp: 90 tablet, Rfl: 1   tamoxifen (NOLVADEX) 20 MG tablet, TAKE ONE TABLET BY MOUTH ONCE EVERY DAY., Disp: 90 tablet, Rfl: 3   famotidine (PEPCID) 20 MG tablet, TAKE ONE TABLET BY MOUTH 2 TIMES A DAY, Disp: 180 tablet, Rfl: 3   Subjective:   PATIENT ID: Erin Wagner GENDER: female DOB: May 23, 1941, MRN: [id number]  Chief Complaint  Patient presents with   Consult   Follow-up    Dry cough in am and 2-3 times during the day.  No sob.    HPI  82 year old female presenting to clinic for the evaluation of cough. Patient was interviewed with the help of an audio Gujarati interpretor.   Patient had COVID-19 in November 2023 that was treated with Paxlovid with improvement. Following this, she developed a few bouts of cough that responded to antibiotics. She was  referred to our clinic for evaluation of said chief complaint.  She currently only reports a cough, mostly in the morning, productive of whitish sputum.  She does not have any shortness of breath, chest pain, fevers, chills, night sweats, weight loss, hemoptysis, GI, or GU symptoms.  She reports some chest tightness that occurs only when she coughs.  She does not have a wheeze.  She does not endorse any similar symptoms in the past and feels she was fairly healthy throughout her life.  She has not had any chest infections, any history of asthma, or any use of inhalers in the past.  She was prescribed an inhaler at some point over the past few months that she did not use.    Following our last visit, they were given an ILD questionnaire that they filled, with no exposures noted. This was filled by the patient's son with input from the patient. Aside from history of breast cancer, there were no other medical problems to mention.  No report of recurrent UTI's either. They do report burning some incense sticks at home.  Patient's other past medical history includes breast cancer status post bilateral mastectomy and chemoradiation. She has been maintained on Tamoxifen for that since 2019, with brief period of it being held. 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   Patient is originally from [place] where she was born and lived up until 6 years ago.  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  Patient reports using wood sticks for fuel for around 20 years of her life, after which she switched to [facility] briefly and then kerosene/diesel. She did not have any pet animals or farm animals in [place] nor has she kept any birds.  She moved in with her son 6 years ago in [place].  They have lived in the same house (new build) for the past 6 years.  There is no basement, no water damage, and no mold in the house.  She  does not have any history of working in a factory or any other exposures.  Her son does not have any pets or birds.   Ancillary information including prior medications, full medical/surgical/family/social histories, and PFTs (when available) are listed below and have been reviewed.   Review of Systems  Constitutional:  Negative for chills, fever, malaise/fatigue and weight loss.  Respiratory:  Positive for cough. Negative for hemoptysis, sputum production, shortness of breath and wheezing.   Cardiovascular:  Negative for chest pain, leg swelling and PND.     Objective:   Vitals:   11/05/22 1557  BP: (!) 130/50  Pulse: 95  Temp: 97.8 F (36.6 C)  TempSrc: Oral  SpO2: 94%  Weight: 106 lb 6.4 oz (48.3 kg)  Height: 4\' 3"  (1.295 m)   94% on RA BMI Readings from Last 3 Encounters:  11/05/22 28.76 kg/m  09/13/22 26.68 kg/m  08/27/22 26.78 kg/m   Wt Readings from Last 3 Encounters:  11/05/22 106 lb 6.4 oz (48.3 kg)  09/13/22 106 lb 9.6 oz (48.4 kg)  08/27/22 107 lb (48.5 kg)    Physical Exam Constitutional:      General: She is not in acute distress.    Appearance: Normal appearance. She is not ill-appearing.  HENT:     Head: Normocephalic.     Mouth/Throat:     Mouth: Mucous membranes are moist.  Cardiovascular:     Rate and Rhythm: Normal rate and regular rhythm.     Pulses: Normal pulses.     Heart sounds: Normal heart sounds.  Pulmonary:     Effort: Pulmonary effort is normal. No respiratory distress.     Breath sounds: No stridor. Rales (over the bases) present. No wheezing or rhonchi.  Abdominal:     Palpations: Abdomen is soft.  Neurological:     General: No focal deficit present.     Mental Status: She is alert and oriented to person, place, and time. Mental status is at baseline.     Ancillary Information    Past Medical History:  Diagnosis Date   Cancer [facility])    breast   Cataract    Diabetes mellitus without complication (HCC)    Hyperlipidemia     Hypertension    Thyroid disease      Family History  Problem Relation Age of Onset   Healthy Sister    Healthy Brother  Healthy Daughter    Healthy Sister    Healthy Daughter    Healthy Daughter      Past Surgical History:  Procedure Laterality Date   BREAST SURGERY     EYE SURGERY      Social History   Socioeconomic History   Marital status: Widowed    Spouse name: Not on file   Number of children: 4   Years of education: Not on file   Highest education level: Not on file  Occupational History    Employer: retired    Comment: homemaker  Tobacco Use   Smoking status: Never   Smokeless tobacco: Never  Vaping Use   Vaping Use: Never used  Substance and Sexual Activity   Alcohol use: No   Drug use: No   Sexual activity: Not on file  Other Topics Concern   Not on file  Social History Narrative   Not on file   Social Determinants of Health   Financial Resource Strain: Not on file  Food Insecurity: Not on file  Transportation Needs: Not on file  Physical Activity: Insufficiently Active (06/11/2018)   Exercise Vital Sign    Days of Exercise per Week: 3 days    Minutes of Exercise per Session: 20 min  Stress: Not on file  Social Connections: Not on file  Intimate Partner Violence: Not on file     No Known Allergies   CBC    Component Value Date/Time   WBC 11.3 (H) 02/22/2022 1617   WBC 11.4 (H) 07/29/2020 1440   RBC 4.05 02/22/2022 1617   RBC 4.10 07/29/2020 1440   HGB 10.4 (L) 02/22/2022 1617   HCT 32.3 (L) 02/22/2022 1617   PLT 275 02/22/2022 1617   MCV 80 02/22/2022 1617   MCH 25.7 (L) 02/22/2022 1617   MCH 27.3 07/29/2020 1440   MCHC 32.2 02/22/2022 1617   MCHC 32.7 07/29/2020 1440   RDW 13.1 02/22/2022 1617   LYMPHSABS 3.3 (H) 02/22/2022 1617   MONOABS 1.3 (H) 07/29/2020 1440   EOSABS 0.5 (H) 02/22/2022 1617   BASOSABS 0.1 02/22/2022 1617    Pulmonary Functions Testing Results:     No data to display          Outpatient  Medications Prior to Visit  Medication Sig Dispense Refill   alendronate (FOSAMAX) 70 MG tablet Take 1 tablet (70 mg total) by mouth every 7 (seven) days. Take with a full glass of water on an empty stomach. 4 tablet 11   atorvastatin (LIPITOR) 20 MG tablet Take 1 tablet (20 mg total) by mouth daily. 90 tablet 3   Calcium Carbonate-Vitamin D 600-400 MG-UNIT tablet Take 1 tablet by mouth daily.      ibuprofen (ADVIL) 400 MG tablet Take 1 tablet (400 mg total) by mouth every 6 (six) hours as needed. 30 tablet 0   levothyroxine (SYNTHROID) 25 MCG tablet Take 1 tablet (25 mcg total) by mouth daily. 45 tablet 1   loratadine (CLARITIN) 10 MG tablet Take 1 tablet (10 mg total) by mouth daily. 30 tablet 2   metFORMIN (GLUCOPHAGE) 500 MG tablet Take 1 tablet (500 mg total) by mouth 2 (two) times daily with a meal. 180 tablet 1   Multiple Vitamin (MULTIVITAMIN) tablet Take 1 tablet by mouth daily.     propranolol (INDERAL) 20 MG tablet Take 1 tablet (20 mg total) by mouth 2 (two) times daily. 180 tablet 1   sertraline (ZOLOFT) 25 MG tablet Take 1 tablet (25 mg  total) by mouth daily. 90 tablet 1   tamoxifen (NOLVADEX) 20 MG tablet TAKE ONE TABLET BY MOUTH ONCE EVERY DAY. 90 tablet 3   famotidine (PEPCID) 20 MG tablet TAKE ONE TABLET BY MOUTH 2 TIMES A DAY 180 tablet 3   No facility-administered medications prior to visit.

## 2022-11-07 NOTE — Telephone Encounter (Signed)
Spoke to patient's son over the phone, conveyed discussion with Dr. Smith Robert regarding her tamoxifen. Her 5 years of taking Tamoxifen would be complete in July of 2024. Given possibility of drug induced pneumonitis, I am going to recommend that she discontinue Tamoxifen at the time being. I also asked patient's son to highly consider getting a professional mold remediator to at least do an inspection of their house to rule that out as a cause.  [name], MD [facility] 11/07/2022 4:38 PM

## 2022-11-22 ENCOUNTER — Ambulatory Visit: Payer: Medicaid Other | Admitting: Student in an Organized Health Care Education/Training Program

## 2022-11-26 ENCOUNTER — Other Ambulatory Visit: Payer: Self-pay

## 2022-12-06 ENCOUNTER — Ambulatory Visit: Payer: Medicaid Other | Attending: Student in an Organized Health Care Education/Training Program

## 2022-12-06 DIAGNOSIS — R053 Chronic cough: Secondary | ICD-10-CM | POA: Insufficient documentation

## 2022-12-06 DIAGNOSIS — R0609 Other forms of dyspnea: Secondary | ICD-10-CM | POA: Diagnosis not present

## 2022-12-06 LAB — PULMONARY FUNCTION TEST ARMC ONLY
DL/VA % pred: 49 %
DL/VA: 2.18 ml/min/mmHg/L
DLCO unc % pred: 5 %
DLCO unc: 0.69 ml/min/mmHg
FEF 25-75 Post: 1.68 L/sec
FEF 25-75 Pre: 1.62 L/sec
FEF2575-%Change-Post: 3 %
FEF2575-%Pred-Post: 231 %
FEF2575-%Pred-Pre: 223 %
FEV1-%Change-Post: 1 %
FEV1-%Pred-Post: 136 %
FEV1-%Pred-Pre: 134 %
FEV1-Post: 1.25 L
FEV1-Pre: 1.23 L
FEV1FVC-%Change-Post: -3 %
FEV1FVC-%Pred-Pre: 123 %
FEV6-%Change-Post: 4 %
FEV6-%Pred-Post: 120 %
FEV6-%Pred-Pre: 115 %
FEV6-Post: 1.43 L
FEV6-Pre: 1.37 L
FEV6FVC-%Pred-Post: 109 %
FEV6FVC-%Pred-Pre: 109 %
FVC-%Change-Post: 4 %
FVC-%Pred-Post: 110 %
FVC-%Pred-Pre: 105 %
FVC-Post: 1.43 L
FVC-Pre: 1.37 L
Post FEV1/FVC ratio: 87 %
Post FEV6/FVC ratio: 100 %
Pre FEV1/FVC ratio: 90 %
Pre FEV6/FVC Ratio: 100 %
RV % pred: 65 %
RV: 1.24 L
TLC % pred: 82 %
TLC: 2.8 L

## 2022-12-06 MED ORDER — ALBUTEROL SULFATE (2.5 MG/3ML) 0.083% IN NEBU
2.5000 mg | INHALATION_SOLUTION | Freq: Once | RESPIRATORY_TRACT | Status: AC
  Administered 2022-12-06: 2.5 mg via RESPIRATORY_TRACT
  Filled 2022-12-06: qty 3

## 2022-12-15 ENCOUNTER — Other Ambulatory Visit: Payer: Self-pay | Admitting: Student in an Organized Health Care Education/Training Program

## 2022-12-15 ENCOUNTER — Other Ambulatory Visit: Payer: Self-pay

## 2022-12-15 DIAGNOSIS — R053 Chronic cough: Secondary | ICD-10-CM

## 2022-12-15 LAB — ANA 12 PLUS PROFILE, POSITIVE
Anti-Cardiolipin Ab, IgA (RDL): <12 APL U/mL (ref <12)
Anti-Cardiolipin Ab, IgG (RDL): <15 GPL U/mL (ref <15)
Anti-Cardiolipin Ab, IgM (RDL): <13 MPL U/mL (ref <13)
Anti-Centromere Ab (RDL): <1:40 {titer}
Anti-Chromatin Ab, IgG (RDL): <20 Units (ref <20)
Anti-La (SS-B) Ab (RDL): <20 Units (ref <20)
Anti-Ro (SS-A) Ab (RDL): <20 Units (ref <20)
Anti-Scl-70 Ab (RDL): <20 Units (ref <20)
Anti-Sm Ab (RDL): <20 Units (ref <20)
Anti-TPO Ab (RDL): <9 IU/mL (ref <9.0)
Anti-dsDNA Ab by Farr(RDL): <8 IU/mL (ref <8.0)
C3 Complement (RDL): 157 mg/dL (ref 82–167)
C4 Complement (RDL): 39 mg/dL (ref 14–44)
Midbody Pattern: 1:160 {titer} — ABNORMAL HIGH
Speckled Pattern: 1:40 {titer} — ABNORMAL HIGH

## 2022-12-15 LAB — MYOMARKER 3 PLUS PROFILE (RDL)

## 2022-12-15 LAB — HYPERSENSITIVITY PNEUMONITIS
A. Pullulans Abs: NEGATIVE
A.Fumigatus #1 Abs: NEGATIVE
Micropolyspora faeni, IgG: NEGATIVE
Pigeon Serum Abs: NEGATIVE
Thermoact. Saccharii: NEGATIVE
Thermoactinomyces vulgaris, IgG: NEGATIVE

## 2022-12-15 LAB — ANCA PROFILE
Anti-MPO Antibodies: <0.2 units (ref 0.0–0.9)
Anti-PR3 Antibodies: <0.2 units (ref 0.0–0.9)
Atypical pANCA: <1:20 {titer}
C-ANCA: <1:20 {titer}
P-ANCA: <1:20 {titer}

## 2022-12-15 LAB — CK: Total CK: 21 U/L — ABNORMAL LOW (ref 26–161)

## 2022-12-15 LAB — ANTI-CCP AB, IGG + IGA (RDL): Anti-CCP Ab, IgG + IgA (RDL): <20 Units (ref <20)

## 2022-12-15 LAB — ANA 12 PLUS PROFILE (RDL): Anti-Nuclear Ab by IFA (RDL): POSITIVE — AB

## 2022-12-15 LAB — RHEUMATOID FACTOR: Rheumatoid fact SerPl-aCnc: 10.7 IU/mL (ref <14.0)

## 2022-12-15 MED ORDER — LORATADINE 10 MG PO TABS
10.0000 mg | ORAL_TABLET | Freq: Every day | ORAL | 2 refills | Status: DC
  Filled 2022-12-15: qty 30, 30d supply, fill #0

## 2022-12-16 ENCOUNTER — Other Ambulatory Visit: Payer: Self-pay

## 2022-12-31 ENCOUNTER — Other Ambulatory Visit: Payer: Self-pay

## 2022-12-31 ENCOUNTER — Other Ambulatory Visit: Payer: Self-pay | Admitting: Family Medicine

## 2022-12-31 DIAGNOSIS — K219 Gastro-esophageal reflux disease without esophagitis: Secondary | ICD-10-CM

## 2023-01-01 ENCOUNTER — Other Ambulatory Visit: Payer: Self-pay

## 2023-01-01 MED ORDER — FAMOTIDINE 20 MG PO TABS
20.0000 mg | ORAL_TABLET | Freq: Two times a day (BID) | ORAL | 3 refills | Status: DC
  Filled 2023-01-01: qty 180, 90d supply, fill #0
  Filled 2023-04-21: qty 180, 90d supply, fill #1

## 2023-01-02 ENCOUNTER — Ambulatory Visit (INDEPENDENT_AMBULATORY_CARE_PROVIDER_SITE_OTHER): Payer: Medicaid Other | Admitting: Student in an Organized Health Care Education/Training Program

## 2023-01-02 ENCOUNTER — Other Ambulatory Visit: Payer: Self-pay

## 2023-01-02 ENCOUNTER — Encounter: Payer: Self-pay | Admitting: Student in an Organized Health Care Education/Training Program

## 2023-01-02 VITALS — BP 120/60 | HR 88 | Temp 97.9°F | Ht <= 58 in | Wt 105.0 lb

## 2023-01-02 DIAGNOSIS — J849 Interstitial pulmonary disease, unspecified: Secondary | ICD-10-CM | POA: Diagnosis not present

## 2023-01-02 MED ORDER — AEROCHAMBER MV MISC
0 refills | Status: DC
  Filled 2023-01-02: qty 1, 30d supply, fill #0

## 2023-01-02 MED ORDER — FLUTICASONE PROPIONATE HFA 110 MCG/ACT IN AERO
2.0000 | INHALATION_SPRAY | Freq: Two times a day (BID) | RESPIRATORY_TRACT | 2 refills | Status: DC
  Filled 2023-01-02: qty 12, 30d supply, fill #0
  Filled 2023-02-10 – 2023-02-12 (×2): qty 12, 30d supply, fill #1
  Filled 2023-04-21: qty 12, 30d supply, fill #2

## 2023-01-02 NOTE — Progress Notes (Signed)
Synopsis: Referred in for ILD by Erasmo Downer, MD  Assessment & Plan:   1. ILD (interstitial lung disease) (HCC)  Presents for the evaluation of a cough that appears to have exacerbated after infection with COVID. History from the patient suggests bio-fuel exposure at a younger age which does increase her risk for the development of COPD. 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 High resolution chest CT obtained 09/26/2022 shows progressive findings of subpleural reticulation and ground glass, with mosaic attenuation and airtrapping, that is suggestive of hypersensitivity pneumonitis. Fibrotic NSIP is also on the differential. Patient is still to have her pulmonary function test.   I did discuss these findings with them, and we went over the differential of hypersensitivity pneumonitis, NSIP, and drug induced pneumonitis. I reviewed her medication list and Sertraline and Tamoxifen are both associated with drug induced pneumonitis. We also started a short course of prednisone which she is finishing up. She hasn't unfortunately not tolerated being off the Sertraline. Patient and family inquiring about the possibility of restarting it. Furthermore, she has stopped taking the Tamoxifen after discussion with Dr. Smith Robert given it's close to the 5 year mark. During a previous visit we discussed that diagnosis is best made with surgical biopsy but I am hesitant to offer it given patient's age. Similarly I don't believe the benefit from flexible bronchoscopy is worth the associated risk. ILD workup has essentially been negative, aside from a mildly positive ANA with negative specific antibodies.  We discussed that given improvement with prednisone, the inflammation is likely steroid responsive. I would like to attempt and spare the patient systemic steroid exposure, and we discussed the possibility of doing  inhaler corticosteroids for management of her condition. Now that she is near done with the prednisone taper, and given improvement, I will prescribe ICS (fluticasone) at a moderate dose. I will also prescribe a spacer device to make drug delivery easier. We will get a repeat HRCT in 1.5-2 months and I will see her for follow up afterwards to assess treatment response.  Finally, we discussed resumption of SSRI's. I explained that sertraline has the potential to cause multiple pulmonary side effects, which I am unable to prove without a biopsy. Family and patient would like to know if there is any other alternative. I performed a search on [employer]neum[employer] and it appears that ILD and pulmonary side effects are not described with escitalopram. I have recommended that they reach out to PCP for escitalopram initiation.    - fluticasone (FLOVENT HFA) 110 MCG/ACT inhaler; Inhale 2 puffs into the lungs 2 (two) times daily.  Dispense: 12 g; Refill: 2 - Spacer/Aero-Holding Chambers (AEROCHAMBER MV) inhaler; Use as instructed  Dispense: 1 each; Refill: 0 - CT CHEST HIGH RESOLUTION; Future   Return in about 3 months (around 04/04/2023).  I spent 30 minutes caring for this patient today, including preparing to see the patient, obtaining a medical history , reviewing a separately obtained history, performing a medically appropriate examination and/or evaluation, counseling and educating the patient/family/caregiver, ordering medications, tests, or procedures, referring and communicating with other health care professionals (not separately reported), documenting clinical information in the electronic health record, and independently interpreting results (not separately reported/billed) and communicating results to the patient/family/caregiver  [name], MD [facility] 01/02/2023 5:04 PM    End of visit medications:  Meds ordered this encounter  Medications  fluticasone (FLOVENT HFA) 110  MCG/ACT inhaler    Sig: Inhale 2 puffs into the lungs 2 (two) times daily.    Dispense:  12 g    Refill:  2   Spacer/Aero-Holding Chambers (AEROCHAMBER MV) inhaler    Sig: Use as instructed    Dispense:  1 each    Refill:  0     Current Outpatient Medications:    alendronate (FOSAMAX) 70 MG tablet, Take 1 tablet (70 mg total) by mouth every 7 (seven) days. Take with a full glass of water on an empty stomach., Disp: 4 tablet, Rfl: 11   atorvastatin (LIPITOR) 20 MG tablet, Take 1 tablet (20 mg total) by mouth daily., Disp: 90 tablet, Rfl: 3   Calcium Carbonate-Vitamin D 600-400 MG-UNIT tablet, Take 1 tablet by mouth daily. , Disp: , Rfl:    famotidine (PEPCID) 20 MG tablet, Take 1 tablet (20 mg total) by mouth 2 (two) times daily., Disp: 180 tablet, Rfl: 3   fluticasone (FLOVENT HFA) 110 MCG/ACT inhaler, Inhale 2 puffs into the lungs 2 (two) times daily., Disp: 12 g, Rfl: 2   ibuprofen (ADVIL) 400 MG tablet, Take 1 tablet (400 mg total) by mouth every 6 (six) hours as needed., Disp: 30 tablet, Rfl: 0   levothyroxine (SYNTHROID) 25 MCG tablet, Take 1 tablet (25 mcg total) by mouth daily., Disp: 45 tablet, Rfl: 1   loratadine (CLARITIN) 10 MG tablet, Take 1 tablet (10 mg total) by mouth daily., Disp: 30 tablet, Rfl: 2   metFORMIN (GLUCOPHAGE) 500 MG tablet, Take 1 tablet (500 mg total) by mouth 2 (two) times daily with a meal., Disp: 180 tablet, Rfl: 1   Multiple Vitamin (MULTIVITAMIN) tablet, Take 1 tablet by mouth daily., Disp: , Rfl:    predniSONE (DELTASONE) 5 MG tablet, Take 4 tablets (20 mg total) by mouth daily with breakfast for 3 weeks. Then take 3 tablets (15 mg) daily for 2 weeks. Then take 2 tablets (10 mg) daily for 2 weeks. Then take 1 tablet (5 mg) daily for 2 weeks., Disp: 168 tablet, Rfl: 0   propranolol (INDERAL) 20 MG tablet, Take 1 tablet (20 mg total) by mouth 2 (two) times daily., Disp: 180 tablet, Rfl: 1   sertraline (ZOLOFT) 25 MG tablet, Take 1 tablet (25 mg total) by  mouth daily., Disp: 90 tablet, Rfl: 1   Spacer/Aero-Holding Chambers (AEROCHAMBER MV) inhaler, Use as instructed, Disp: 1 each, Rfl: 0   tamoxifen (NOLVADEX) 20 MG tablet, TAKE ONE TABLET BY MOUTH ONCE EVERY DAY., Disp: 90 tablet, Rfl: 3   Subjective:   PATIENT ID: Erin Wagner GENDER: female DOB: September 07, 1940, MRN: [id number]  Chief Complaint  Patient presents with   Follow-up    Breathing has improved since last OV.     HPI  82 year old female presenting to clinic for the evaluation of cough. Patient was interviewed with the help of an audio Gujarati interpretor.   Patient had COVID-19 in November 2023 that was treated with Paxlovid with improvement. Following this, she developed a few bouts of cough that responded to antibiotics. She was referred to our clinic for evaluation of said chief complaint.  I started a short taper of prednisone during our last visit for the management of her interstitial lung disease after discussion with patient and family. We decided against going for bronchoscopy or surgical lung biopsy, and opted for a more conservative approach. Patient is presenting today for follow up.  They report that the breathing has been much  improved. The cough and shortness of breath are nearly resolved. We had stopped the patient's sertraline following our last visit and she hasn't tolerated the discontinuation well. She's been on it for 30 years and has had multiple somatic complaints once discontinued. She has not had any chest infections, any history of asthma, or any use of inhalers in the past.     During a previous visit, The ILD questionnaire was administered and no exposures were noted. This was filled by the patient's son with input from the patient. Aside from history of breast cancer, there were no other medical problems to mention. No report of recurrent UTI's either. They do report burning some incense sticks at home which they've since stopped.   Patient's  other past medical history includes breast cancer status post bilateral mastectomy and chemoradiation. She had been maintained on Tamoxifen for that since 2019, with brief period of it being held. 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   Patient is originally from [place] where she was born and lived up until 6 years ago.  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  Patient reports using wood sticks for fuel for around 20 years of her life, after which she switched to [facility] briefly and then kerosene/diesel. She did not have any pet animals or farm animals in [place] nor has she kept any birds.  She moved in with her son 6 years ago in [place].  They have lived in the same house (new build) for the past 6 years.  There is no basement, no water damage, and no mold in the house. This was again revisited with the son today and they confirmed double checking and not seeing any mold.  She does not have any history of working in a factory or any other exposures.  Her son does not have any pets or birds.  Ancillary information including prior medications, full medical/surgical/family/social histories, and PFTs (when available) are listed below and have been reviewed.   Review of Systems  Constitutional:  Negative for chills, fever, malaise/fatigue and weight loss.  Respiratory:  Negative for cough, hemoptysis, sputum production, shortness of breath and wheezing.   Cardiovascular:  Negative for chest pain, leg swelling and PND.     Objective:   Vitals:   01/02/23 1549  BP: 120/60  Pulse: 88  Temp: 97.9 F (36.6 C)  TempSrc: Temporal  SpO2: 99%  Weight: 105 lb (47.6 kg)  Height: 4\' 3"  (1.295 m)   99% on RA  BMI Readings from Last 3 Encounters:  01/02/23 28.38 kg/m  11/05/22 28.76 kg/m  09/13/22 26.68 kg/m   Wt Readings from Last 3 Encounters:  01/02/23 105 lb (47.6 kg)  11/05/22 106  lb 6.4 oz (48.3 kg)  09/13/22 106 lb 9.6 oz (48.4 kg)    Physical Exam Constitutional:      General: She is not in acute distress.    Appearance: Normal appearance. She is not ill-appearing.  HENT:     Head: Normocephalic.     Mouth/Throat:     Mouth: Mucous membranes are moist.  Cardiovascular:     Rate and Rhythm: Normal rate and regular rhythm.     Pulses: Normal pulses.     Heart sounds: Normal heart sounds.  Pulmonary:     Effort: Pulmonary effort is normal. No respiratory distress.     Breath sounds: No stridor.  Rales (over the bases) present. No wheezing or rhonchi.  Abdominal:     Palpations: Abdomen is soft.  Neurological:     General: No focal deficit present.     Mental Status: She is alert and oriented to person, place, and time. Mental status is at baseline.       Ancillary Information    Past Medical History:  Diagnosis Date   Cancer [facility])    breast   Cataract    Diabetes mellitus without complication (HCC)    Hyperlipidemia    Hypertension    Thyroid disease      Family History  Problem Relation Age of Onset   Healthy Sister    Healthy Brother    Healthy Daughter    Healthy Sister    Healthy Daughter    Healthy Daughter      Past Surgical History:  Procedure Laterality Date   BREAST SURGERY     EYE SURGERY      Social History   Socioeconomic History   Marital status: Widowed    Spouse name: Not on file   Number of children: 4   Years of education: Not on file   Highest education level: Not on file  Occupational History    Employer: retired    Comment: homemaker  Tobacco Use   Smoking status: Never   Smokeless tobacco: Never  Vaping Use   Vaping Use: Never used  Substance and Sexual Activity   Alcohol use: No   Drug use: No   Sexual activity: Not on file  Other Topics Concern   Not on file  Social History Narrative   Not on file   Social Determinants of Health   Financial Resource Strain: Not on file  Food  Insecurity: Not on file  Transportation Needs: Not on file  Physical Activity: Insufficiently Active (06/11/2018)   Exercise Vital Sign    Days of Exercise per Week: 3 days    Minutes of Exercise per Session: 20 min  Stress: Not on file  Social Connections: Not on file  Intimate Partner Violence: Not on file     No Known Allergies   CBC    Component Value Date/Time   WBC 11.3 (H) 02/22/2022 1617   WBC 11.4 (H) 07/29/2020 1440   RBC 4.05 02/22/2022 1617   RBC 4.10 07/29/2020 1440   HGB 10.4 (L) 02/22/2022 1617   HCT 32.3 (L) 02/22/2022 1617   PLT 275 02/22/2022 1617   MCV 80 02/22/2022 1617   MCH 25.7 (L) 02/22/2022 1617   MCH 27.3 07/29/2020 1440   MCHC 32.2 02/22/2022 1617   MCHC 32.7 07/29/2020 1440   RDW 13.1 02/22/2022 1617   LYMPHSABS 3.3 (H) 02/22/2022 1617   MONOABS 1.3 (H) 07/29/2020 1440   EOSABS 0.5 (H) 02/22/2022 1617   BASOSABS 0.1 02/22/2022 1617    Pulmonary Functions Testing Results:    Latest Ref Rng & Units 12/06/2022    4:37 PM  PFT Results  FVC-Pre L 1.37   FVC-Predicted Pre % 105   FVC-Post L 1.43   FVC-Predicted Post % 110   Pre FEV1/FVC % % 90   Post FEV1/FCV % % 87   FEV1-Pre L 1.23   FEV1-Predicted Pre % 134   FEV1-Post L 1.25   DLCO uncorrected ml/min/mmHg 0.69   DLCO UNC% % 5   DLVA Predicted % 49   TLC L 2.80   TLC % Predicted % 82   RV % Predicted % 65  Outpatient Medications Prior to Visit  Medication Sig Dispense Refill   alendronate (FOSAMAX) 70 MG tablet Take 1 tablet (70 mg total) by mouth every 7 (seven) days. Take with a full glass of water on an empty stomach. 4 tablet 11   atorvastatin (LIPITOR) 20 MG tablet Take 1 tablet (20 mg total) by mouth daily. 90 tablet 3   Calcium Carbonate-Vitamin D 600-400 MG-UNIT tablet Take 1 tablet by mouth daily.      famotidine (PEPCID) 20 MG tablet Take 1 tablet (20 mg total) by mouth 2 (two) times daily. 180 tablet 3   ibuprofen (ADVIL) 400 MG tablet Take 1 tablet (400 mg total) by  mouth every 6 (six) hours as needed. 30 tablet 0   levothyroxine (SYNTHROID) 25 MCG tablet Take 1 tablet (25 mcg total) by mouth daily. 45 tablet 1   loratadine (CLARITIN) 10 MG tablet Take 1 tablet (10 mg total) by mouth daily. 30 tablet 2   metFORMIN (GLUCOPHAGE) 500 MG tablet Take 1 tablet (500 mg total) by mouth 2 (two) times daily with a meal. 180 tablet 1   Multiple Vitamin (MULTIVITAMIN) tablet Take 1 tablet by mouth daily.     predniSONE (DELTASONE) 5 MG tablet Take 4 tablets (20 mg total) by mouth daily with breakfast for 3 weeks. Then take 3 tablets (15 mg) daily for 2 weeks. Then take 2 tablets (10 mg) daily for 2 weeks. Then take 1 tablet (5 mg) daily for 2 weeks. 168 tablet 0   propranolol (INDERAL) 20 MG tablet Take 1 tablet (20 mg total) by mouth 2 (two) times daily. 180 tablet 1   sertraline (ZOLOFT) 25 MG tablet Take 1 tablet (25 mg total) by mouth daily. 90 tablet 1   tamoxifen (NOLVADEX) 20 MG tablet TAKE ONE TABLET BY MOUTH ONCE EVERY DAY. 90 tablet 3   No facility-administered medications prior to visit.

## 2023-01-11 ENCOUNTER — Other Ambulatory Visit: Payer: Self-pay

## 2023-01-15 ENCOUNTER — Ambulatory Visit (INDEPENDENT_AMBULATORY_CARE_PROVIDER_SITE_OTHER): Payer: Medicaid Other | Admitting: Family Medicine

## 2023-01-15 ENCOUNTER — Encounter: Payer: Self-pay | Admitting: Family Medicine

## 2023-01-15 ENCOUNTER — Other Ambulatory Visit: Payer: Self-pay

## 2023-01-15 VITALS — BP 111/67 | HR 89 | Temp 98.1°F | Resp 12 | Ht <= 58 in | Wt 106.8 lb

## 2023-01-15 DIAGNOSIS — J849 Interstitial pulmonary disease, unspecified: Secondary | ICD-10-CM

## 2023-01-15 DIAGNOSIS — R2689 Other abnormalities of gait and mobility: Secondary | ICD-10-CM

## 2023-01-15 DIAGNOSIS — F341 Dysthymic disorder: Secondary | ICD-10-CM

## 2023-01-15 DIAGNOSIS — R269 Unspecified abnormalities of gait and mobility: Secondary | ICD-10-CM | POA: Diagnosis not present

## 2023-01-15 MED ORDER — ESCITALOPRAM OXALATE 5 MG PO TABS
5.0000 mg | ORAL_TABLET | Freq: Every day | ORAL | 2 refills | Status: DC
  Filled 2023-01-15 – 2023-01-16 (×2): qty 30, 30d supply, fill #0
  Filled 2023-02-10 – 2023-02-12 (×2): qty 30, 30d supply, fill #1

## 2023-01-15 NOTE — Assessment & Plan Note (Signed)
Was recently on prednisone taper. Pulmonologist stopped Zoloft. Continue to follow with pulmonology

## 2023-01-15 NOTE — Progress Notes (Signed)
Established Patient Office Visit  Subjective   Patient ID: Erin Wagner, female    DOB: 1940-10-07  Age: 82 y.o. MRN: [id number]  Chief Complaint  Patient presents with   Medical Management of Chronic Issues    Patient was offered interpreter, she declined. History was obtained via daughter.  The patient is here today to discuss starting Lexapro. She was previously on Zoloft for years. At her last pulmonologist appointment, her physician recommended stopping Zoloft due to its linkage to ILD. At that appointment he advised her to stop Zoloft all together.  Around the same time, she completed a prednisone series for her ILD. After stopping the prednisone and Zoloft, the patient started experiencing low energy, brain fog, and generally feeling overall unwell. She has had L knee pain, shaking in her R arm, and experiencing feeling jittery.  She has requested something to help her remain stable with walking. She has trouble maintaining her balance while ambulating.      Review of Systems  Constitutional:  Positive for malaise/fatigue.  Musculoskeletal:  Positive for joint pain and myalgias.  Neurological:  Positive for tingling and tremors.  Psychiatric/Behavioral:  The patient is nervous/anxious.       Objective:     BP 111/67 (BP Location: Left Arm, Patient Position: Sitting, Cuff Size: Normal)   Pulse 89   Temp 98.1 F (36.7 C) (Temporal)   Resp 12   Ht 4\' 3"  (1.295 m)   Wt 106 lb 12.8 oz (48.4 kg)   SpO2 99%   BMI 28.87 kg/m    Physical Exam Constitutional:      Appearance: Normal appearance.  Musculoskeletal:     Left lower leg: Edema (deviates inward when ambulating) present.  Neurological:     Mental Status: She is alert and oriented to person, place, and time.     Motor: No weakness.     Gait: Gait abnormal (unsteady gait with trouble maintaining balance).     Deep Tendon Reflexes: Reflexes normal.      No results found for any visits on  01/15/23.    The ASCVD Risk score (Arnett DK, et al., 2019) failed to calculate for the following reasons:   The 2019 ASCVD risk score is only valid for ages 56 to 22    Assessment & Plan:   Problem List Items Addressed This Visit       Respiratory   ILD (interstitial lung disease) (HCC) - Primary    Was recently on prednisone taper. Pulmonologist stopped Zoloft. Continue to follow with pulmonology        Other   Depression    Was previously on Zoloft. Per pulmonologist, should switch to Lexapro due to Zoloft's connection to ILD. Patient's Zoloft was stopped abruptly which may be contributing to her current symptoms Start Lexapro 5 mg  CTM current symptoms      Relevant Medications   escitalopram (LEXAPRO) 5 MG tablet   Gait difficulty    Per patient and family has had difficulty ambulating due to trouble with balance. On physical exam, gait was unsteady and slow with her L leg tilting inwards. She has a need for ambulation assistance. Prescription placed for walker       Other Visit Diagnoses     Imbalance           No follow-ups on file.    Rometta Emery, Medical Student  Patient seen along with MS3 student Jodi Marble. I personally evaluated this patient along with the  student, and verified all aspects of the history, physical exam, and medical decision making as documented by the student. I agree with the student's documentation and have made all necessary edits.  Elohim Brune, Marzella Schlein, MD, MPH [facility]

## 2023-01-15 NOTE — Assessment & Plan Note (Addendum)
Was previously on Zoloft. Per pulmonologist, should switch to Lexapro due to Zoloft's connection to ILD. Patient's Zoloft was stopped abruptly which may be contributing to her current symptoms Start Lexapro 5 mg  CTM current symptoms

## 2023-01-15 NOTE — Assessment & Plan Note (Signed)
Per patient and family has had difficulty ambulating due to trouble with balance. On physical exam, gait was unsteady and slow with her L leg tilting inwards. She has a need for ambulation assistance. Prescription placed for walker

## 2023-01-16 ENCOUNTER — Other Ambulatory Visit: Payer: Self-pay

## 2023-01-24 ENCOUNTER — Other Ambulatory Visit: Payer: Self-pay

## 2023-02-04 ENCOUNTER — Other Ambulatory Visit: Payer: Self-pay | Admitting: Family Medicine

## 2023-02-04 DIAGNOSIS — M816 Localized osteoporosis [Lequesne]: Secondary | ICD-10-CM

## 2023-02-05 ENCOUNTER — Other Ambulatory Visit: Payer: Self-pay

## 2023-02-05 MED ORDER — ALENDRONATE SODIUM 70 MG PO TABS
70.0000 mg | ORAL_TABLET | ORAL | 0 refills | Status: DC
  Filled 2023-02-05: qty 12, 84d supply, fill #0

## 2023-02-05 NOTE — Telephone Encounter (Signed)
Requested medication (s) are due for refill today: last dispensed 01/02/23  Requested medication (s) are on the active medication list: yes   Last refill:  02/22/22 #4 11 refills   Future visit scheduled: yes in 1 week  Notes to clinic:  too soon for refills. Patient comment: Please refill for 3 months do you want to refill Rx?     Requested Prescriptions  Pending Prescriptions Disp Refills   alendronate (FOSAMAX) 70 MG tablet 4 tablet 11    Sig: Take 1 tablet (70 mg total) by mouth every 7 (seven) days. Take with a full glass of water on an empty stomach.     Endocrinology:  Bisphosphonates Failed - 02/04/2023  4:52 PM      Failed - Vitamin D in normal range and within 360 days    No results found for: "HY8657QI6", "NG2952WU1", "VD125OH2TOT", "25OHVITD3", "25OHVITD2", "25OHVITD1", "VD25OH"       Failed - Mg Level in normal range and within 360 days    No results found for: "MG"       Failed - Phosphate in normal range and within 360 days    No results found for: "PHOS"       Passed - Ca in normal range and within 360 days    Calcium  Date Value Ref Range Status  08/27/2022 10.0 8.7 - 10.3 mg/dL Final         Passed - Cr in normal range and within 360 days    Creatinine, Ser  Date Value Ref Range Status  08/27/2022 0.96 0.57 - 1.00 mg/dL Final         Passed - eGFR is 30 or above and within 360 days    GFR calc Af Amer  Date Value Ref Range Status  01/18/2020 57 (L) >60 mL/min Final   GFR, Estimated  Date Value Ref Range Status  07/29/2020 60 (L) >60 mL/min Final    Comment:    (NOTE) Calculated using the CKD-EPI Creatinine Equation (2021)    eGFR  Date Value Ref Range Status  08/27/2022 59 (L) >59 mL/min/1.73 Final         Passed - Valid encounter within last 12 months    Recent Outpatient Visits           3 weeks ago ILD (interstitial lung disease) (HCC)   [place] [facility] [place], Marzella Schlein, MD   5 months ago Encounter for annual  physical exam   [place] [facility] [place], Marzella Schlein, MD   7 months ago Acute bacterial bronchitis   [facility] [place] Merita Norton T, FNP   7 months ago Abnormal lung sounds   [place] [facility] [place], [place], [place]   11 months ago Type 2 diabetes mellitus with other specified complication, without long-term current use of insulin [facility])   [place] [facility] Jacky Kindle, FNP       Future Appointments             In 1 week [place], Marzella Schlein, MD [facility]            Passed - Bone Mineral Density or Dexa Scan completed in the last 2 years

## 2023-02-11 ENCOUNTER — Other Ambulatory Visit: Payer: Self-pay

## 2023-02-12 ENCOUNTER — Other Ambulatory Visit: Payer: Self-pay

## 2023-02-14 ENCOUNTER — Ambulatory Visit: Payer: Medicaid Other | Admitting: Family Medicine

## 2023-02-18 ENCOUNTER — Other Ambulatory Visit: Payer: Self-pay

## 2023-02-18 ENCOUNTER — Ambulatory Visit (INDEPENDENT_AMBULATORY_CARE_PROVIDER_SITE_OTHER): Payer: Medicaid Other | Admitting: Family Medicine

## 2023-02-18 ENCOUNTER — Encounter: Payer: Self-pay | Admitting: Family Medicine

## 2023-02-18 VITALS — HR 84 | Temp 97.6°F | Resp 16 | Ht <= 58 in | Wt 108.9 lb

## 2023-02-18 DIAGNOSIS — E039 Hypothyroidism, unspecified: Secondary | ICD-10-CM

## 2023-02-18 DIAGNOSIS — I152 Hypertension secondary to endocrine disorders: Secondary | ICD-10-CM

## 2023-02-18 DIAGNOSIS — I890 Lymphedema, not elsewhere classified: Secondary | ICD-10-CM

## 2023-02-18 DIAGNOSIS — M25512 Pain in left shoulder: Secondary | ICD-10-CM

## 2023-02-18 DIAGNOSIS — E1159 Type 2 diabetes mellitus with other circulatory complications: Secondary | ICD-10-CM | POA: Diagnosis not present

## 2023-02-18 DIAGNOSIS — F341 Dysthymic disorder: Secondary | ICD-10-CM

## 2023-02-18 DIAGNOSIS — E1169 Type 2 diabetes mellitus with other specified complication: Secondary | ICD-10-CM | POA: Diagnosis not present

## 2023-02-18 DIAGNOSIS — M62838 Other muscle spasm: Secondary | ICD-10-CM

## 2023-02-18 DIAGNOSIS — E785 Hyperlipidemia, unspecified: Secondary | ICD-10-CM

## 2023-02-18 MED ORDER — BACLOFEN 10 MG PO TABS
10.0000 mg | ORAL_TABLET | Freq: Three times a day (TID) | ORAL | 3 refills | Status: DC
  Filled 2023-02-18: qty 30, 10d supply, fill #0

## 2023-02-18 MED ORDER — ESCITALOPRAM OXALATE 5 MG PO TABS
5.0000 mg | ORAL_TABLET | Freq: Every day | ORAL | 3 refills | Status: DC
  Filled 2023-02-18 – 2023-03-13 (×2): qty 90, 90d supply, fill #0

## 2023-02-18 NOTE — Assessment & Plan Note (Signed)
Well controlled Continue current medications Recheck metabolic panel F/u in 6 months  

## 2023-02-18 NOTE — Assessment & Plan Note (Signed)
Previously well controlled Continue Synthroid at current dose  Recheck TSH and adjust Synthroid as indicated   

## 2023-02-18 NOTE — Assessment & Plan Note (Signed)
Well controlled with last A1c - recheck today Continue current medications Uacr today On Statin Discussed diet and exercise F/u in 6 months

## 2023-02-18 NOTE — Progress Notes (Signed)
Established Patient Office Visit  Subjective   Patient ID: Erin Wagner, female    DOB: 10-17-1940  Age: 82 y.o. MRN: [id number]  Chief Complaint  Patient presents with   Medical Management of Chronic Issues    HPI  Discussed the use of AI scribe software for clinical note transcription with the patient, who gave verbal consent to proceed.  History of Present Illness   The patient, with a history of bilateral mastectomy, presents with left arm swelling and shoulder pain. The swelling has been present for about a month and improves with elevation. The patient also experiences pain in the back of the shoulder. This is the second occurrence of such symptoms, with the first episode occurring two to three years ago, which resolved with physical therapy and the use of compression sleeves. The patient has lost the compression sleeves and reports that they were beneficial in the past.  In addition to the arm swelling and shoulder pain, the patient has a persistent cough. Despite a change in medication from Sertraline to Lexapro, the cough has not improved. The patient's family member expresses dissatisfaction with the change in medication, noting that the patient was happier and had fewer complaints on Sertraline. The patient also uses a pump for the cough, but it has not completely resolved the symptom.         ROS per HPI    Objective:     Pulse 84   Temp 97.6 F (36.4 C) (Temporal)   Resp 16   Ht 4\' 3"  (1.295 m)   Wt 108 lb 14.4 oz (49.4 kg)   SpO2 99%   BMI 29.44 kg/m    Physical Exam Vitals reviewed.  Constitutional:      General: She is not in acute distress.    Appearance: Normal appearance. She is well-developed. She is not diaphoretic.  HENT:     Head: Normocephalic and atraumatic.  Eyes:     General: No scleral icterus.    Conjunctiva/sclera: Conjunctivae normal.  Neck:     Thyroid: No thyromegaly.  Cardiovascular:     Rate and Rhythm: Normal rate and  regular rhythm.     Pulses: Normal pulses.     Heart sounds: Normal heart sounds. No murmur heard. Pulmonary:     Effort: Pulmonary effort is normal. No respiratory distress.     Breath sounds: Normal breath sounds. No wheezing, rhonchi or rales.  Musculoskeletal:     Cervical back: Neck supple.     Right lower leg: No edema.     Left lower leg: No edema.     Comments: Lymphedema LUE  Lymphadenopathy:     Cervical: No cervical adenopathy.  Skin:    General: Skin is warm and dry.     Findings: No rash.  Neurological:     Mental Status: She is alert and oriented to person, place, and time. Mental status is at baseline.  Psychiatric:        Mood and Affect: Mood normal.        Behavior: Behavior normal.      No results found for any visits on 02/18/23.    The ASCVD Risk score (Arnett DK, et al., 2019) failed to calculate for the following reasons:   The 2019 ASCVD risk score is only valid for ages 72 to 88    Assessment & Plan:   Problem List Items Addressed This Visit       Cardiovascular and Mediastinum   Hypertension associated with  diabetes (HCC)    Well controlled Continue current medications Recheck metabolic panel F/u in 6 months       Relevant Orders   Comprehensive metabolic panel     Endocrine   Diabetes mellitus (HCC) - Primary    Well controlled with last A1c - recheck today Continue current medications Uacr today On Statin Discussed diet and exercise F/u in 6 months       Relevant Orders   Hemoglobin A1c   Urine Microalbumin w/creat. ratio   Hyperlipidemia associated with type 2 diabetes mellitus (HCC)    Previously well controlled Continue statin Repeat FLP and CMP Goal LDL < 70      Relevant Orders   Lipid Panel With LDL/HDL Ratio   Hypothyroidism    Previously well controlled Continue Synthroid at current dose  Recheck TSH and adjust Synthroid as indicated        Relevant Orders   TSH     Other   Depression    No  complaints on lexapro, but Zoloft may have worked better Will continue pending discussion with Pulm - advised that avoiding possible ILD SE from Zoloft is important      Relevant Medications   escitalopram (LEXAPRO) 5 MG tablet   Lymphedema   Relevant Orders   Ambulatory referral to Home Health   Other Visit Diagnoses     Trapezius muscle spasm       Relevant Orders   Ambulatory referral to Home Health   Acute pain of left shoulder       Relevant Orders   Ambulatory referral to Home Health          Left Arm Lymphedema Recurrent swelling for the past month, likely secondary to previous bilateral breast surgeries. Improvement with elevation and previous use of compression sleeves. -Order new day and night compression sleeves. -Continue elevation and massage exercises. -Avoid blood draws and blood pressure measurements on the left arm when possible.  Left Shoulder Pain Pain and muscle tightness in the left shoulder, possibly related to lymphedema and previous surgeries. -Prescribe Baclofen as needed for muscle relaxation. -Order home physical therapy for shoulder rehabilitation and lymphedema management.  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 Patient previously responded well to Sertraline, but it was discontinued due to potential lung effects. Currently on Lexapro. -Continue Lexapro and monitor response. -Discuss potential return to Sertraline with pulmonologist at next appointment.  General Health Maintenance -Order routine labs including A1C, cholesterol, kidney and liver function, thyroid function, and annual urine test for diabetes. -Plan for follow-up in six months, unless issues arise.         Return in about 6 months (around 08/21/2023) for CPE.    Shirlee Latch, MD

## 2023-02-18 NOTE — Assessment & Plan Note (Signed)
Previously well controlled Continue statin Repeat FLP and CMP Goal LDL < 70 

## 2023-02-18 NOTE — Assessment & Plan Note (Signed)
No complaints on lexapro, but Zoloft may have worked better Will continue pending discussion with Pulm - advised that avoiding possible ILD SE from Zoloft is important

## 2023-02-26 ENCOUNTER — Ambulatory Visit: Payer: Medicaid Other | Admitting: Family Medicine

## 2023-03-12 ENCOUNTER — Ambulatory Visit
Admission: RE | Admit: 2023-03-12 | Discharge: 2023-03-12 | Disposition: A | Discharge status: Home / Self Care | Payer: Medicaid Other | Source: Ambulatory Visit | Attending: Student in an Organized Health Care Education/Training Program | Admitting: Student in an Organized Health Care Education/Training Program

## 2023-03-12 DIAGNOSIS — J849 Interstitial pulmonary disease, unspecified: Secondary | ICD-10-CM | POA: Insufficient documentation

## 2023-03-13 ENCOUNTER — Other Ambulatory Visit: Payer: Self-pay | Admitting: Family Medicine

## 2023-03-14 ENCOUNTER — Other Ambulatory Visit: Payer: Self-pay

## 2023-03-14 MED ORDER — METFORMIN HCL 500 MG PO TABS
500.0000 mg | ORAL_TABLET | Freq: Two times a day (BID) | ORAL | 1 refills | Status: DC
  Filled 2023-03-14: qty 180, 90d supply, fill #0
  Filled 2023-06-11: qty 180, 90d supply, fill #1

## 2023-03-14 MED ORDER — LEVOTHYROXINE SODIUM 25 MCG PO TABS
25.0000 ug | ORAL_TABLET | Freq: Every day | ORAL | 1 refills | Status: DC
  Filled 2023-03-14: qty 90, 90d supply, fill #0

## 2023-03-25 ENCOUNTER — Other Ambulatory Visit: Payer: Self-pay

## 2023-03-26 DIAGNOSIS — G250 Essential tremor: Secondary | ICD-10-CM

## 2023-03-26 DIAGNOSIS — E7849 Other hyperlipidemia: Secondary | ICD-10-CM | POA: Diagnosis not present

## 2023-03-26 DIAGNOSIS — I890 Lymphedema, not elsewhere classified: Secondary | ICD-10-CM

## 2023-03-26 DIAGNOSIS — I152 Hypertension secondary to endocrine disorders: Secondary | ICD-10-CM | POA: Diagnosis not present

## 2023-03-26 DIAGNOSIS — M62838 Other muscle spasm: Secondary | ICD-10-CM

## 2023-03-26 DIAGNOSIS — J849 Interstitial pulmonary disease, unspecified: Secondary | ICD-10-CM

## 2023-03-26 DIAGNOSIS — E1159 Type 2 diabetes mellitus with other circulatory complications: Secondary | ICD-10-CM | POA: Diagnosis not present

## 2023-03-26 DIAGNOSIS — M25512 Pain in left shoulder: Secondary | ICD-10-CM

## 2023-03-26 DIAGNOSIS — M816 Localized osteoporosis [Lequesne]: Secondary | ICD-10-CM

## 2023-03-26 DIAGNOSIS — E1169 Type 2 diabetes mellitus with other specified complication: Secondary | ICD-10-CM | POA: Diagnosis not present

## 2023-03-26 DIAGNOSIS — E039 Hypothyroidism, unspecified: Secondary | ICD-10-CM

## 2023-03-26 DIAGNOSIS — F341 Dysthymic disorder: Secondary | ICD-10-CM

## 2023-04-02 ENCOUNTER — Telehealth: Payer: Self-pay | Admitting: Family Medicine

## 2023-04-02 NOTE — Telephone Encounter (Signed)
Have printed forms and will get provider to sign.

## 2023-04-02 NOTE — Telephone Encounter (Signed)
Erin Wagner from [facility],, says received order for  upper extremity compression garment, and day/nihgt sleeve. They also faxed over codes ,for this. She says the  form that was sent back ,wasn't signed. Please cb to discuss

## 2023-04-03 NOTE — Telephone Encounter (Signed)
Erin Norton, FNP signed forms. Forms faxed.

## 2023-04-08 ENCOUNTER — Ambulatory Visit: Payer: Medicaid Other | Admitting: Student in an Organized Health Care Education/Training Program

## 2023-04-08 ENCOUNTER — Other Ambulatory Visit: Payer: Self-pay

## 2023-04-08 ENCOUNTER — Encounter: Payer: Self-pay | Admitting: Student in an Organized Health Care Education/Training Program

## 2023-04-08 VITALS — BP 128/60 | HR 89 | Temp 98.1°F | Ht <= 58 in | Wt 106.0 lb

## 2023-04-08 DIAGNOSIS — J849 Interstitial pulmonary disease, unspecified: Secondary | ICD-10-CM

## 2023-04-08 DIAGNOSIS — R053 Chronic cough: Secondary | ICD-10-CM

## 2023-04-08 MED ORDER — LORATADINE 10 MG PO TABS
10.0000 mg | ORAL_TABLET | Freq: Every day | ORAL | 11 refills | Status: DC
  Filled 2023-04-08: qty 30, 30d supply, fill #0
  Filled 2023-05-19: qty 30, 30d supply, fill #1

## 2023-04-08 NOTE — Progress Notes (Signed)
Synopsis: Referred in for ILD by Erasmo Downer, MD  Assessment & Plan:   #ILD  Presents for the evaluation of a cough that appears to have exacerbated after infection with COVID. History from the patient suggests bio-fuel exposure at a younger age which does increase her risk for the development of COPD. 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 High resolution chest CT obtained 09/26/2022 showed progressive findings of subpleural reticulation and ground glass, with mosaic attenuation and airtrapping, that is suggestive of hypersensitivity pneumonitis. Fibrotic NSIP is also on the differential. She's since received a course of steroids, with repeat chest imaging performed 03/2023. On my review, the inflammation and ground glass has reduced when compared to prior. 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   I did discuss these findings with them, and explained that with improvement on prednisone, I would consider further immune suppression should symptoms or radiographic findings of inflammation recur. Should that be the case, would consider mycophenolate or hydroxychloroquine. She continues on inhaled corticosteroids with fluticasone in an attempt to deliver steroids locally to the lung parenchyma.  Further, we reviewed her medication list during a prior visit and Sertraline and Tamoxifen were both associated with drug induced pneumonitis. Patient and family inquiring about the possibility of restarting Sertraline. Did discuss risks and benefits - appears that the patient is having a difficult time without it. Recommended to discuss this further with patient's PCP.    - CT CHEST HIGH RESOLUTION; Future  #Chronic cough  This appears to be distinct from initial presentation, and improved  with loratadine, consistent with anti-histamine. Continue loratadine.  - loratadine (CLARITIN) 10 MG tablet; Take 1 tablet (10 mg total) by mouth daily.  Dispense: 30 tablet; Refill: 11  Return in about 5 months (around 09/11/2023).  I spent 30 minutes caring for this patient today, including preparing to see the patient, obtaining a medical history , reviewing a separately obtained history, performing a medically appropriate examination and/or evaluation, counseling and educating the patient/family/caregiver, ordering medications, tests, or procedures, documenting clinical information in the electronic health record, and independently interpreting results (not separately reported/billed) and communicating results to the patient/family/caregiver  [name], MD [facility] 04/08/2023 4:23 PM    End of visit medications:  Meds ordered this encounter  Medications   loratadine (CLARITIN) 10 MG tablet    Sig: Take 1 tablet (10 mg total) by mouth daily.    Dispense:  30 tablet    Refill:  11     Current Outpatient Medications:    alendronate (FOSAMAX) 70 MG tablet, Take 1 tablet (70 mg total) by mouth every 7 (seven) days. Take with a full glass of water on an empty stomach., Disp: 12 tablet, Rfl: 0   atorvastatin (LIPITOR) 20 MG tablet, Take 1 tablet (20 mg total) by mouth daily., Disp: 90 tablet, Rfl: 3   baclofen (LIORESAL) 10 MG tablet, Take 1 tablet (10 mg total) by mouth 3 (three) times daily., Disp: 30 each, Rfl: 3   Calcium Carbonate-Vitamin D 600-400 MG-UNIT tablet, Take 1 tablet by mouth daily. , Disp: , Rfl:    escitalopram (LEXAPRO) 5 MG tablet, Take 1 tablet (5 mg total) by mouth daily., Disp: 90 tablet, Rfl: 3   famotidine (PEPCID) 20 MG tablet, Take 1  tablet (20 mg total) by mouth 2 (two) times daily., Disp: 180 tablet, Rfl: 3   fluticasone (FLOVENT HFA) 110 MCG/ACT inhaler, Inhale 2 puffs into the lungs 2 (two) times daily., Disp: 12 g, Rfl: 2    levothyroxine (SYNTHROID) 25 MCG tablet, Take 1 tablet (25 mcg total) by mouth daily., Disp: 45 tablet, Rfl: 1   loratadine (CLARITIN) 10 MG tablet, Take 1 tablet (10 mg total) by mouth daily., Disp: 30 tablet, Rfl: 11   metFORMIN (GLUCOPHAGE) 500 MG tablet, Take 1 tablet (500 mg total) by mouth 2 (two) times daily with a meal., Disp: 180 tablet, Rfl: 1   propranolol (INDERAL) 20 MG tablet, Take 1 tablet (20 mg total) by mouth 2 (two) times daily., Disp: 180 tablet, Rfl: 1   Spacer/Aero-Holding Chambers (AEROCHAMBER MV) inhaler, Use as instructed, Disp: 1 each, Rfl: 0   Subjective:   PATIENT ID: Erin Wagner GENDER: female DOB: 1941/04/30, MRN: [id number]  Chief Complaint  Patient presents with   Follow-up    Cough with white phlegm. Shortness of breath on exertion.     HPI  82 year old female presenting to clinic for the evaluation of cough. She is presenting for follow up.   Patient had COVID-19 in November 2023 that was treated with Paxlovid with improvement. Following this, she developed a few bouts of cough that responded to antibiotics. She was referred to our clinic for evaluation of said chief complaint.   I started a short taper of prednisone during a previous visit for the management of her interstitial lung disease after discussion with patient and family. We decided against going for bronchoscopy or surgical lung biopsy, and opted for a more conservative approach. She felt much better following the prednisone, with improvement in cough and shortness of breath.  She is presenting today for follow up. She's had a recent chest CT in high resolution that we are to discuss today. Her cough has recurred, and they restarted loratadine with improvement in symptoms. She continues to have multiple somatic complaints following discontinuation of sertraline, escitalopram has not had a sufficient effect in resolving said symptoms.   During a previous visit, The ILD questionnaire was  administered and no exposures were noted. This was filled by the patient's son with input from the patient. Aside from history of breast cancer, there were no other medical problems to mention. No report of recurrent UTI's either. They do report burning some incense sticks at home which they've since stopped.   Patient's other past medical history includes breast cancer status post bilateral mastectomy and chemoradiation. She had been maintained on Tamoxifen for that since 2019, with brief period of it being held. 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   Patient is originally from [place] where she was born and lived up until 6 years ago.  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  Patient reports using wood sticks for fuel for around 20 years of her life, after which she switched to [facility] briefly and then kerosene/diesel. She did not have any pet animals or farm animals in [place] nor has she kept any birds.  She moved in with her son 6 years ago in [place].  They have lived in the same house (new build) for the past 7 years.  There is no basement, no water damage, and no mold in the house. This was again revisited with  the son today and they confirmed double checking and not seeing any mold.  She does not have any history of working in a factory or any other exposures.  Her son does not have any pets or birds.  Ancillary information including prior medications, full medical/surgical/family/social histories, and PFTs (when available) are listed below and have been reviewed.   Review of Systems  Constitutional:  Negative for chills, fever, malaise/fatigue and weight loss.  Respiratory:  Positive for cough. Negative for hemoptysis, sputum production, shortness of breath and wheezing.   Cardiovascular:  Negative for chest pain, leg swelling and PND.     Objective:   Vitals:   04/08/23 1548   BP: 128/60  Pulse: 89  Temp: 98.1 F (36.7 C)  TempSrc: Temporal  SpO2: 97%  Weight: 106 lb (48.1 kg)  Height: 4\' 3"  (1.295 m)   97% on RA  BMI Readings from Last 3 Encounters:  04/08/23 28.65 kg/m  02/18/23 29.44 kg/m  01/15/23 28.87 kg/m   Wt Readings from Last 3 Encounters:  04/08/23 106 lb (48.1 kg)  02/18/23 108 lb 14.4 oz (49.4 kg)  01/15/23 106 lb 12.8 oz (48.4 kg)    Physical Exam Constitutional:      General: She is not in acute distress.    Appearance: Normal appearance. She is not ill-appearing.  HENT:     Head: Normocephalic.     Mouth/Throat:     Mouth: Mucous membranes are moist.  Cardiovascular:     Rate and Rhythm: Normal rate and regular rhythm.     Pulses: Normal pulses.     Heart sounds: Normal heart sounds.  Pulmonary:     Effort: Pulmonary effort is normal. No respiratory distress.     Breath sounds: No stridor. Rales (over the bases) present. No wheezing or rhonchi.  Abdominal:     Palpations: Abdomen is soft.  Neurological:     General: No focal deficit present.     Mental Status: She is alert and oriented to person, place, and time. Mental status is at baseline.       Ancillary Information    Past Medical History:  Diagnosis Date   Cancer [facility])    breast   Cataract    Diabetes mellitus without complication (HCC)    Hyperlipidemia    Hypertension    Thyroid disease      Family History  Problem Relation Age of Onset   Healthy Sister    Healthy Brother    Healthy Daughter    Healthy Sister    Healthy Daughter    Healthy Daughter      Past Surgical History:  Procedure Laterality Date   BREAST SURGERY     EYE SURGERY      Social History   Socioeconomic History   Marital status: Widowed    Spouse name: Not on file   Number of children: 4   Years of education: Not on file   Highest education level: 6th grade  Occupational History    Employer: retired    Comment: homemaker  Tobacco Use   Smoking status:  Never   Smokeless tobacco: Never  Vaping Use   Vaping status: Never Used  Substance and Sexual Activity   Alcohol use: No   Drug use: No   Sexual activity: Not on file  Other Topics Concern   Not on file  Social History Narrative   Not on file   Social Determinants of Health   Financial Resource Strain: Low Risk  (  02/14/2023)   Overall Financial Resource Strain (CARDIA)    Difficulty of Paying Living Expenses: Not very hard  Food Insecurity: No Food Insecurity (02/14/2023)   Hunger Vital Sign    Worried About Running Out of Food in the Last Year: Never true    Ran Out of Food in the Last Year: Never true  Transportation Needs: No Transportation Needs (02/14/2023)   PRAPARE - Administrator, Civil Service (Medical): No    Lack of Transportation (Non-Medical): No  Physical Activity: Insufficiently Active (02/14/2023)   Exercise Vital Sign    Days of Exercise per Week: 4 days    Minutes of Exercise per Session: 10 min  Stress: No Stress Concern Present (02/14/2023)   [employer] of Occupational Health - Occupational Stress Questionnaire    Feeling of Stress : Only a little  Social Connections: Moderately Isolated (02/14/2023)   Social Connection and Isolation Panel [NHANES]    Frequency of Communication with Friends and Family: Once a week    Frequency of Social Gatherings with Friends and Family: More than three times a week    Attends Religious Services: 1 to 4 times per year    Active Member of [employer] or Organizations: No    Attends Banker Meetings: Not on file    Marital Status: Widowed  Intimate Partner Violence: Not on file     No Known Allergies   CBC    Component Value Date/Time   WBC 11.3 (H) 02/22/2022 1617   WBC 11.4 (H) 07/29/2020 1440   RBC 4.05 02/22/2022 1617   RBC 4.10 07/29/2020 1440   HGB 10.4 (L) 02/22/2022 1617   HCT 32.3 (L) 02/22/2022 1617   PLT 275 02/22/2022 1617   MCV 80 02/22/2022 1617   MCH 25.7 (L) 02/22/2022 1617    MCH 27.3 07/29/2020 1440   MCHC 32.2 02/22/2022 1617   MCHC 32.7 07/29/2020 1440   RDW 13.1 02/22/2022 1617   LYMPHSABS 3.3 (H) 02/22/2022 1617   MONOABS 1.3 (H) 07/29/2020 1440   EOSABS 0.5 (H) 02/22/2022 1617   BASOSABS 0.1 02/22/2022 1617    Pulmonary Functions Testing Results:    Latest Ref Rng & Units 12/06/2022    4:37 PM  PFT Results  FVC-Pre L 1.37   FVC-Predicted Pre % 105   FVC-Post L 1.43   FVC-Predicted Post % 110   Pre FEV1/FVC % % 90   Post FEV1/FCV % % 87   FEV1-Pre L 1.23   FEV1-Predicted Pre % 134   FEV1-Post L 1.25   DLCO uncorrected ml/min/mmHg 0.69   DLCO UNC% % 5   DLVA Predicted % 49   TLC L 2.80   TLC % Predicted % 82   RV % Predicted % 65     Outpatient Medications Prior to Visit  Medication Sig Dispense Refill   alendronate (FOSAMAX) 70 MG tablet Take 1 tablet (70 mg total) by mouth every 7 (seven) days. Take with a full glass of water on an empty stomach. 12 tablet 0   atorvastatin (LIPITOR) 20 MG tablet Take 1 tablet (20 mg total) by mouth daily. 90 tablet 3   baclofen (LIORESAL) 10 MG tablet Take 1 tablet (10 mg total) by mouth 3 (three) times daily. 30 each 3   Calcium Carbonate-Vitamin D 600-400 MG-UNIT tablet Take 1 tablet by mouth daily.      escitalopram (LEXAPRO) 5 MG tablet Take 1 tablet (5 mg total) by mouth daily. 90 tablet 3  famotidine (PEPCID) 20 MG tablet Take 1 tablet (20 mg total) by mouth 2 (two) times daily. 180 tablet 3   fluticasone (FLOVENT HFA) 110 MCG/ACT inhaler Inhale 2 puffs into the lungs 2 (two) times daily. 12 g 2   levothyroxine (SYNTHROID) 25 MCG tablet Take 1 tablet (25 mcg total) by mouth daily. 45 tablet 1   metFORMIN (GLUCOPHAGE) 500 MG tablet Take 1 tablet (500 mg total) by mouth 2 (two) times daily with a meal. 180 tablet 1   propranolol (INDERAL) 20 MG tablet Take 1 tablet (20 mg total) by mouth 2 (two) times daily. 180 tablet 1   Spacer/Aero-Holding Chambers (AEROCHAMBER MV) inhaler Use as instructed 1 each  0   No facility-administered medications prior to visit.

## 2023-04-16 ENCOUNTER — Encounter: Payer: Self-pay | Admitting: Family Medicine

## 2023-04-18 NOTE — Telephone Encounter (Signed)
Stop lexapro. Resume zoloft 25mg  daily (please send Rx #90 r1). Due for CPE on or after 08/29/23

## 2023-04-19 ENCOUNTER — Other Ambulatory Visit: Payer: Self-pay

## 2023-04-19 MED ORDER — SERTRALINE HCL 25 MG PO TABS
25.0000 mg | ORAL_TABLET | Freq: Every day | ORAL | 1 refills | Status: DC
  Filled 2023-04-19: qty 90, 90d supply, fill #0

## 2023-04-19 NOTE — Progress Notes (Signed)
Erasmo Downer, MD  [place] NurseYesterday (4:52 PM)    Stop lexapro. Resume zoloft 25mg  daily (please send Rx #90 r1). Due for CPE on or after 08/29/23      Note

## 2023-04-21 ENCOUNTER — Other Ambulatory Visit: Payer: Self-pay

## 2023-04-21 ENCOUNTER — Other Ambulatory Visit: Payer: Self-pay | Admitting: Family Medicine

## 2023-04-22 ENCOUNTER — Inpatient Hospital Stay: Payer: Medicaid Other | Attending: Oncology | Admitting: Oncology

## 2023-04-22 ENCOUNTER — Other Ambulatory Visit: Payer: Self-pay

## 2023-04-22 ENCOUNTER — Encounter: Payer: Self-pay | Admitting: Oncology

## 2023-04-22 VITALS — BP 137/74 | HR 80 | Temp 97.0°F | Resp 17 | Ht <= 58 in | Wt 105.9 lb

## 2023-04-22 DIAGNOSIS — Z170 Estrogen receptor positive status [ER+]: Secondary | ICD-10-CM | POA: Insufficient documentation

## 2023-04-22 DIAGNOSIS — Z79899 Other long term (current) drug therapy: Secondary | ICD-10-CM | POA: Insufficient documentation

## 2023-04-22 DIAGNOSIS — M81 Age-related 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 fracture: Secondary | ICD-10-CM

## 2023-04-22 DIAGNOSIS — Z7951 Long term (current) use of inhaled steroids: Secondary | ICD-10-CM | POA: Diagnosis not present

## 2023-04-22 DIAGNOSIS — K219 Gastro-esophageal reflux disease without esophagitis: Secondary | ICD-10-CM | POA: Diagnosis not present

## 2023-04-22 DIAGNOSIS — Z08 Encounter for follow-up examination after completed treatment for malignant neoplasm: Secondary | ICD-10-CM

## 2023-04-22 DIAGNOSIS — Z7984 Long term (current) use of oral hypoglycemic drugs: Secondary | ICD-10-CM | POA: Insufficient documentation

## 2023-04-22 DIAGNOSIS — M7989 Other specified soft tissue disorders: Secondary | ICD-10-CM | POA: Insufficient documentation

## 2023-04-22 DIAGNOSIS — Z79811 Long term (current) use of aromatase inhibitors: Secondary | ICD-10-CM | POA: Insufficient documentation

## 2023-04-22 DIAGNOSIS — E1136 Type 2 diabetes mellitus with diabetic cataract: Secondary | ICD-10-CM | POA: Diagnosis not present

## 2023-04-22 DIAGNOSIS — I890 Lymphedema, not elsewhere classified: Secondary | ICD-10-CM

## 2023-04-22 DIAGNOSIS — C50911 Malignant neoplasm of unspecified site of right female breast: Secondary | ICD-10-CM | POA: Insufficient documentation

## 2023-04-22 DIAGNOSIS — Z853 Personal history of malignant neoplasm of breast: Secondary | ICD-10-CM | POA: Diagnosis not present

## 2023-04-22 DIAGNOSIS — I10 Essential (primary) hypertension: Secondary | ICD-10-CM | POA: Insufficient documentation

## 2023-04-22 DIAGNOSIS — E079 Disorder of thyroid, unspecified: Secondary | ICD-10-CM | POA: Diagnosis not present

## 2023-04-22 DIAGNOSIS — Z79890 Hormone replacement therapy (postmenopausal): Secondary | ICD-10-CM | POA: Insufficient documentation

## 2023-04-22 DIAGNOSIS — E785 Hyperlipidemia, unspecified: Secondary | ICD-10-CM | POA: Diagnosis not present

## 2023-04-22 MED ORDER — PROPRANOLOL HCL 20 MG PO TABS
20.0000 mg | ORAL_TABLET | Freq: Two times a day (BID) | ORAL | 0 refills | Status: DC
  Filled 2023-04-22: qty 180, 90d supply, fill #0

## 2023-04-22 NOTE — Progress Notes (Signed)
Hematology/Oncology Consult note [facility]  [phone] [phone]  Patient Care Team: Erasmo Downer, MD as PCP - General (Family Medicine)   Name of the patient: Erin Wagner  [id number]  1941-04-16   Date of visit: 04/22/23  Diagnosis-  h/o b/l breast cancer treated in [place] currently on tamoxifen   Chief complaint/ Reason for visit- routine f/u of breast cancer  Heme/Onc history: Patient is a 82 year old Gujarati speaking female from [place] with a past medical history significant for hypertension hyperlipidemia, diabetes and bilateral breast cancer.  She is here with son and daughter in law. She has refused formal language interpretor and would like her family to interpret for her. Her family is comfortable speaking in Hindi with me and translating for her.   Her oncology history is as follows:   1.  She was diagnosed with bilateral breast cancer in July 2017 in [place].  She initially received neoadjuvant chemotherapy with 3 cycles of epirubicin and cyclophosphamide.  This was followed by bilateral modified radical mastectomy.  I do not have the actual pathology report from [place].  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  I am unable to decipher the written handwriting well.  Appears to have 1 out of 13 lymph nodes positive with PNE positive, no LV I or perineural invasion.  Left breast mass showed 6 cm mixed infiltrating ductal and lobular histology.  Again handwritten notes mention 3 out of 16 lymph nodes positive.  In some other page on the outside report, the right breast cancer has been mentioned as T2 N0 in the left breast cancer has been mentioned as T4N0.   2.  Following surgery she had 1 cycle of epirubicin and cyclophosphamide and 2 cycles of doxorubicin.  She then had IMRT to her bilateral breasts over 5 weeks.   She was then asked to take tamoxifen for 6 months only.  There is no mention about ER PR and HER-2 status anywhere in the outside reports. Patient was asked to take tamoxifen for 6 months following radiation and then stop it.   3. 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  Patient had a bone density scan in July 2019 which showed significant osteoporosis.  Since definitive evidence of ER status was not obtainable, given history of bilateral node positive breast cancer and was to proceed with tamoxifen assuming that it was ER PR positive.  Tamoxifen 20 mg started July 2019.  Fosamax weekly started for osteoporosis.  She also had CT chest abdomen and pelvis as well as bone scan which did not reveal any evidence of malignancy.  Focally increased activity in the left second rib to the presence of subacute fracture.  5.  Patient took tamoxifen for 5 years and ideally 10 years was recommended given bilateral high risk breast cancer.  However she was diagnosed with NSIP and tamoxifen was considered as a potential contributor and therefore stopped sometime in September 2024  Interval history-patient is here with her daughter-in-law.  I am able to communicate with her in Hindi.  She continues to have occasional burning and swelling of her left upper extremity from lymphedema and she has not received her new sleeve yet.  She also continues to have  chronic cough and recently increased her PPI dosage to twice daily.  She reports some ongoing heartburn  ECOG PS- 2 Pain scale- 3   Review of systems- Review of Systems  Constitutional:  Positive for malaise/fatigue. Negative for chills, fever and weight loss.  HENT:  Negative for congestion, ear discharge and nosebleeds.   Eyes:  Negative for blurred vision.  Respiratory:  Negative for cough, hemoptysis, sputum production,  shortness of breath and wheezing.   Cardiovascular:  Negative for chest pain, palpitations, orthopnea and claudication.  Gastrointestinal:  Negative for abdominal pain, blood in stool, constipation, diarrhea, heartburn, melena, nausea and vomiting.  Genitourinary:  Negative for dysuria, flank pain, frequency, hematuria and urgency.  Musculoskeletal:  Negative for back pain, joint pain and myalgias.       Left arm swelling  Skin:  Negative for rash.  Neurological:  Negative for dizziness, tingling, focal weakness, seizures, weakness and headaches.  Endo/Heme/Allergies:  Does not bruise/bleed easily.  Psychiatric/Behavioral:  Negative for depression and suicidal ideas. The patient does not have insomnia.       No Known Allergies   Past Medical History:  Diagnosis Date   Breast cancer (HCC)    Cataract    Diabetes mellitus without complication (HCC)    Hyperlipidemia    Hypertension    Thyroid disease      Past Surgical History:  Procedure Laterality Date   BREAST SURGERY     EYE SURGERY      Social History   Socioeconomic History   Marital status: Widowed    Spouse name: Not on file   Number of children: 4   Years of education: Not on file   Highest education level: 6th grade  Occupational History    Employer: retired    Comment: homemaker  Tobacco Use   Smoking status: Never   Smokeless tobacco: Never  Vaping Use   Vaping status: Never Used  Substance and Sexual Activity   Alcohol use: No   Drug use: No   Sexual activity: Not on file  Other Topics Concern   Not on file  Social History Narrative   Not on file   Social Determinants of Health   Financial Resource Strain: Low Risk  (02/14/2023)   Overall Financial Resource Strain (CARDIA)    Difficulty of Paying Living Expenses: Not very hard  Food Insecurity: No Food Insecurity (04/22/2023)   Hunger Vital Sign    Worried About Running Out of Food in the Last Year: Never true    Ran Out of Food in the Last  Year: Never true  Transportation Needs: No Transportation Needs (04/22/2023)   PRAPARE - Administrator, Civil Service (Medical): No    Lack of Transportation (Non-Medical): No  Physical Activity: Insufficiently Active (02/14/2023)   Exercise Vital Sign    Days of Exercise per Week: 4 days    Minutes of Exercise per Session: 10 min  Stress: No Stress Concern Present (02/14/2023)   [employer] of Occupational Health - Occupational Stress Questionnaire    Feeling of Stress : Only a little  Social Connections: Moderately Isolated (02/14/2023)   Social Connection and Isolation Panel [NHANES]    Frequency of Communication with Friends and Family: Once a week    Frequency of Social Gatherings with Friends and Family: More than three times a week    Attends Religious Services: 1 to 4 times per year    Active Member of Clubs or Organizations: No    Attends  Banker Meetings: Not on file    Marital Status: Widowed  Intimate Partner Violence: Not At Risk (04/22/2023)   Humiliation, Afraid, Rape, and Kick questionnaire    Fear of Current or Ex-Partner: No    Emotionally Abused: No    Physically Abused: No    Sexually Abused: No    Family History  Problem Relation Age of Onset   Healthy Sister    Healthy Brother    Healthy Daughter    Healthy Sister    Healthy Daughter    Healthy Daughter      Current Outpatient Medications:    atorvastatin (LIPITOR) 20 MG tablet, Take 1 tablet (20 mg total) by mouth daily., Disp: 90 tablet, Rfl: 3   Calcium Carbonate-Vitamin D 600-400 MG-UNIT tablet, Take 1 tablet by mouth daily. , Disp: , Rfl:    escitalopram (LEXAPRO) 5 MG tablet, Take 1 tablet (5 mg total) by mouth daily., Disp: 90 tablet, Rfl: 3   famotidine (PEPCID) 20 MG tablet, Take 1 tablet (20 mg total) by mouth 2 (two) times daily., Disp: 180 tablet, Rfl: 3   fluticasone (FLOVENT HFA) 110 MCG/ACT inhaler, Inhale 2 puffs into the lungs 2 (two) times daily., Disp: 12 g,  Rfl: 2   levothyroxine (SYNTHROID) 25 MCG tablet, Take 1 tablet (25 mcg total) by mouth daily., Disp: 45 tablet, Rfl: 1   loratadine (CLARITIN) 10 MG tablet, Take 1 tablet (10 mg total) by mouth daily., Disp: 30 tablet, Rfl: 11   metFORMIN (GLUCOPHAGE) 500 MG tablet, Take 1 tablet (500 mg total) by mouth 2 (two) times daily with a meal., Disp: 180 tablet, Rfl: 1   propranolol (INDERAL) 20 MG tablet, Take 1 tablet (20 mg total) by mouth 2 (two) times daily., Disp: 180 tablet, Rfl: 0   alendronate (FOSAMAX) 70 MG tablet, Take 1 tablet (70 mg total) by mouth every 7 (seven) days. Take with a full glass of water on an empty stomach. (Patient not taking: Reported on 04/22/2023), Disp: 12 tablet, Rfl: 0   baclofen (LIORESAL) 10 MG tablet, Take 1 tablet (10 mg total) by mouth 3 (three) times daily. (Patient not taking: Reported on 04/22/2023), Disp: 30 each, Rfl: 3   sertraline (ZOLOFT) 25 MG tablet, Take 1 tablet (25 mg total) by mouth daily. (Patient not taking: Reported on 04/22/2023), Disp: 90 tablet, Rfl: 1   Spacer/Aero-Holding Chambers (AEROCHAMBER MV) inhaler, Use as instructed, Disp: 1 each, Rfl: 0  Physical exam:  Vitals:   04/22/23 1439  BP: 137/74  Pulse: 80  Resp: 17  Temp: (!) 97 F (36.1 C)  TempSrc: Tympanic  SpO2: 100%  Weight: 105 lb 14.4 oz (48 kg)  Height: 4\' 3"  (1.295 m)   Physical Exam Cardiovascular:     Rate and Rhythm: Normal rate and regular rhythm.     Heart sounds: Normal heart sounds.  Pulmonary:     Effort: Pulmonary effort is normal.     Breath sounds: Normal breath sounds.  Abdominal:     General: Bowel sounds are normal.     Palpations: Abdomen is soft.  Skin:    General: Skin is warm and dry.  Neurological:     Mental Status: She is alert and oriented to person, place, and time.   Chest wall exam: Patient is s/p bilateral mastectomy without reconstruction.  No evidence of chest wall recurrence.  She has chronic left upper extremity lymphedema      Latest Ref Rng & Units 02/18/2023  3:43 PM  CMP  Glucose 70 - 99 mg/dL 161   BUN 8 - 27 mg/dL 16   Creatinine 0.96 - 1.00 mg/dL 0.45   Sodium 409 - 811 mmol/L 129   Potassium 3.5 - 5.2 mmol/L 4.8   Chloride 96 - 106 mmol/L 96   CO2 20 - 29 mmol/L 22   Calcium 8.7 - 10.3 mg/dL 9.6   Total Protein 6.0 - 8.5 g/dL 6.3   Total Bilirubin 0.0 - 1.2 mg/dL <9.1   Alkaline Phos 44 - 121 IU/L 80   AST 0 - 40 IU/L 34   ALT 0 - 32 IU/L 26       Latest Ref Rng & Units 02/22/2022    4:17 PM  CBC  WBC 3.4 - 10.8 x10E3/uL 11.3   Hemoglobin 11.1 - 15.9 g/dL 47.8   Hematocrit 29.5 - 46.6 % 32.3   Platelets 150 - 450 x10E3/uL 275     No images are attached to the encounter.  No results found.   Assessment and plan- Patient is a 82 y.o. female with bilateral breast cancer (stage is unclear and ER PR and her 2 neu status is also unclear) s/p neoadjuvant chemotherapy followed bilateral mastectomy without reconstruction, adjuvant chemotherapy, RT and 5 years of tamoxifen.  She is here for routine follow-up  Patient completed 5 years of tamoxifen and subsequently it had to be stopped in September 2024 due to findings of NSIP which can be sometimes seen in the setting of tamoxifen use.  I think it would be okay for her to continue to hold off on taking tamoxifen at this time.  Since patient is having issues with acid reflux it would be okay for her to stop weekly Fosamax as well.  She has been on it now for 5 years and it would be reasonable to give her a drug holiday at this time  I am referring her back to [facility] for reevaluation of her left upper extremity lymphedema  I will see her back in 1 year   Visit Diagnosis 1. Age-related 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 fracture   2. Encounter for follow-up surveillance of breast cancer   3. Lymphedema      Dr. Owens Shark, MD, MPH [facility] [phone] 04/22/2023 4:29 PM

## 2023-04-22 NOTE — Telephone Encounter (Signed)
Requested Prescriptions  Pending Prescriptions Disp Refills   propranolol (INDERAL) 20 MG tablet 180 tablet 0    Sig: Take 1 tablet (20 mg total) by mouth 2 (two) times daily.     Cardiovascular:  Beta Blockers Passed - 04/21/2023  5:46 AM      Passed - Last BP in normal range    BP Readings from Last 1 Encounters:  04/08/23 128/60         Passed - Last Heart Rate in normal range    Pulse Readings from Last 1 Encounters:  04/08/23 89         Passed - Valid encounter within last 6 months    Recent Outpatient Visits           2 months ago Type 2 diabetes mellitus with other specified complication, without long-term current use of insulin (HCC)   [facility] [place], Marzella Schlein, MD   3 months ago ILD (interstitial lung disease) [facility])   [facility] [place], Marzella Schlein, MD   7 months ago Encounter for annual physical exam   [facility] Erasmo Downer, MD   10 months ago Acute bacterial bronchitis   [facility] Jacky Kindle, FNP   10 months ago Abnormal lung sounds   [facility] [place], [place], [place]       Future Appointments             Tomorrow Bac[place], Marzella Schlein, MD [facility]

## 2023-04-23 ENCOUNTER — Ambulatory Visit: Payer: Medicaid Other | Admitting: Family Medicine

## 2023-04-23 ENCOUNTER — Encounter: Payer: Self-pay | Admitting: Family Medicine

## 2023-04-23 VITALS — BP 137/50 | HR 83 | Temp 97.6°F | Ht <= 58 in | Wt 103.5 lb

## 2023-04-23 DIAGNOSIS — F341 Dysthymic disorder: Secondary | ICD-10-CM | POA: Diagnosis not present

## 2023-04-23 DIAGNOSIS — N3941 Urge incontinence: Secondary | ICD-10-CM | POA: Insufficient documentation

## 2023-04-23 DIAGNOSIS — M21161 Varus deformity, not elsewhere classified, right knee: Secondary | ICD-10-CM | POA: Insufficient documentation

## 2023-04-23 DIAGNOSIS — M21162 Varus deformity, not elsewhere classified, left knee: Secondary | ICD-10-CM

## 2023-04-23 NOTE — Progress Notes (Signed)
Established Patient Office Visit  Subjective   Patient ID: Erin Wagner, female    DOB: 05-07-1941  Age: 82 y.o. MRN: [id number]  Chief Complaint  Patient presents with   Follow-up    Pt office visit to change medications     HPI  Discussed the use of AI scribe software for clinical note transcription with the patient, who gave verbal consent to proceed.  History of Present Illness   The patient, with a history of lung disease, has been experiencing issues with her medication. She was previously on Zoloft, which was switched to Lexapro due to concerns about interstitial lung disease. However, the patient was not happy with Lexapro, reporting daily headaches and a decrease in her writing ability, which she enjoyed. The patient has now stopped Lexapro and has resumed Zoloft, which she was happy with in the past.  In addition to her lung disease and medication issues, the patient's legs are turning inwards, particularly when she walks. This has not caused any pain, but it has affected her walking ability. The patient is currently receiving physical therapy once a week and is doing daily exercises at home.  The patient also experiences urinary incontinence, particularly when she is about to use the bathroom. This issue has persisted despite the use of diapers. The patient has reduced her caffeine intake, which has helped somewhat, but the issue remains.  The patient is considering getting a flu shot, which she has not done in the past. She has been advised that it would be beneficial due to her lung disease.         ROS    Objective:     BP (!) 137/50   Pulse 83   Temp 97.6 F (36.4 C) (Oral)   Ht 4\' 3"  (1.295 m)   Wt 103 lb 8 oz (46.9 kg)   SpO2 100%   BMI 27.98 kg/m    Physical Exam Constitutional:      General: She is not in acute distress.    Appearance: Normal appearance.  HENT:     Head: Normocephalic.  Pulmonary:     Effort: Pulmonary effort is  normal. No respiratory distress.  Musculoskeletal:     Comments: Varus deformity of b/l legs  Neurological:     Mental Status: She is alert and oriented to person, place, and time. Mental status is at baseline.       No results found for any visits on 04/23/23.    The ASCVD Risk score (Arnett DK, et al., 2019) failed to calculate for the following reasons:   The 2019 ASCVD risk score is only valid for ages 60 to 43    Assessment & Plan:   Problem List Items Addressed This Visit       Other   Depression - Primary   Other Visit Diagnoses     Genu varum of both lower extremities       Urge incontinence               Depression Patient was previously well-controlled on Sertraline (Zoloft), but was switched to Escitalopram (Lexapro) due to concerns about potential interstitial lung disease related to Sertraline. Patient experienced worsening mood and daily headaches on Lexapro. -Discontinue Lexapro and restart Sertraline 25 mg daily. -Expect gradual improvement over the next few weeks as Sertraline reaches therapeutic levels.  Urinary Incontinence Patient experiences urinary incontinence, particularly when approaching the bathroom. No pain associated with urination. -Avoid bladder irritants such as caffeine and spicy  foods. -Consider medications to calm bladder spasms if symptoms persist.  Lower Extremity Deformity - varus of the knees Patient has bowing of the legs, leading to inward turning of the feet. No pain in the feet, but some knee pain reported. -Continue with physical therapy. -Ensure shoes have good arch support to improve balance and potentially reduce knee pain.  General Health Maintenance -Recommended annual flu shot due to patient's lung disease, but patient declined. -Schedule physical exam for February 2024.        Return in about 4 months (around 08/24/2023) for CPE (after 2/20, wants late PM appt).    Shirlee Latch, MD

## 2023-05-08 ENCOUNTER — Inpatient Hospital Stay: Payer: Medicaid Other | Admitting: Occupational Therapy

## 2023-05-08 DIAGNOSIS — I890 Lymphedema, not elsewhere classified: Secondary | ICD-10-CM

## 2023-05-08 NOTE — Therapy (Signed)
[facility] [address] [place], [place], [address] Phone: [phone]   Fax:  [phone]  Occupational Therapy Screen  Patient Details  Name: Erin Wagner MRN: [id number] Date of Birth: 1940/08/26 No data recorded  Encounter Date: 05/08/2023   OT End of Session - 05/08/23 1611     Visit Number 0             Past Medical History:  Diagnosis Date   Breast cancer (HCC)    Cataract    Diabetes mellitus without complication (HCC)    Hyperlipidemia    Hypertension    Thyroid disease     Past Surgical History:  Procedure Laterality Date   BREAST SURGERY     EYE SURGERY      There were no vitals filed for this visit.   Subjective Assessment - 05/08/23 1610     Subjective  Her compression garments were never replace since seen in 2021- and daytime compression not providing any compression as well night time one - R arm doing okay but L arm larger    Currently in Pain? No/denies                 LYMPHEDEMA/ONCOLOGY QUESTIONNAIRE - 05/08/23 0001       Right Upper Extremity Lymphedema   15 cm Proximal to Olecranon Process 27 cm    10 cm Proximal to Olecranon Process 26 cm    Olecranon Process 22.6 cm    15 cm Proximal to Ulnar Styloid Process 22.5 cm    10 cm Proximal to Ulnar Styloid Process 19 cm    Just Proximal to Ulnar Styloid Process 15.3 cm    Across Hand at [facility] 18 cm      Left Upper Extremity Lymphedema   15 cm Proximal to Olecranon Process 31 cm    10 cm Proximal to Olecranon Process 30 cm    Olecranon Process 27.5 cm    15 cm Proximal to Ulnar Styloid Process 27.3 cm    10 cm Proximal to Ulnar Styloid Process 25 cm    Just Proximal to Ulnar Styloid Process 17 cm    Across Hand at [employer] Space 17 cm                    Dr. Owens Shark, MD, MPH [facility] [phone] 04/22/2023 Assessment and plan- Patient is a  82 y.o. female with bilateral breast cancer (stage is unclear and ER PR and her 2 neu status is also unclear) s/p neoadjuvant chemotherapy followed bilateral mastectomy without reconstruction, adjuvant chemotherapy, RT and 5 years of tamoxifen.  She is here for routine follow-up   Patient completed 5 years of tamoxifen and subsequently it had to be stopped in September 2024 due to findings of NSIP which can be sometimes seen in the setting of tamoxifen use.  I think it would be okay for her to continue to hold off on taking tamoxifen at this time.   Since patient is having issues with acid reflux it would be okay for her to stop weekly Fosamax as well.  She has been on it now for 5 years and it would be reasonable to give her a drug holiday at this time   I am referring her back to [facility] for reevaluation of her left upper extremity lymphedema   I will see her back in 1 year  Visit Diagnosis 1. Age-related 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 fracture   2. Encounter for follow-up surveillance of breast cancer   3. Lymphedema           OT SCREEN 05/08/23: Patient and son coming to follow-up with OT after being seen in 2021 for bilateral upper extremity lymphedema.  Patient was fitted with a over-the-counter Harmony sleeve on the right and a custom Jobst on the left with a nighttime compression on the left. Per son had a hard time replacing it since then. Garments is not providing any compression anymore. Patient in need for new daytime compression garments for bilateral upper extremity will assess night time compression next visit Patient's right upper extremity is about the same. Left upper extremity increase 2 cm at wrist 4.2 at forearm, 3.3 cm at the elbow and 2.8 in the upper arm compared to 2021 Educate son to use patient's nighttime garment as much as they can with 1 8 or 10 cm short stretch bandage anchoring around the wrist, going through the hand twice in circular  overlapping 50% up to axilla. Patient to wear that as much as she can on and off for bathing and eating and dressing.  And will request OT evaluation and tx  order from Dr. Smith Robert for Monday.                         Visit Diagnosis: Lymphedema    Problem List Patient Active Problem List   Diagnosis Date Noted   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   Urge incontinence 04/23/2023   ILD (interstitial lung disease) (HCC) 01/15/2023   Gait difficulty 01/15/2023   Chronic cough 12/06/2022   Bibasilar crackles 08/27/2022   Localized 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 fracture 02/22/2022   Hypothyroidism 05/13/2019   Essential tremor 05/13/2019   Allergic conjunctivitis of both eyes 05/13/2019   Lymphedema 12/13/2017   History of cataract 09/09/2017   Depression 09/09/2017   History of breast cancer 06/27/2017   Diabetes mellitus (HCC) 06/27/2017   Hyperlipidemia associated with type 2 diabetes mellitus (HCC) 06/27/2017   GERD (gastroesophageal reflux disease) 06/27/2017   Hypertension associated with diabetes (HCC) 06/27/2017    Oletta Cohn, OTR/L,CLT 05/08/2023, 4:14 PM  [facility] [address], [address] [place], [place], [address] Phone: [phone]   Fax:  [phone]  Name: Erin Wagner MRN: [id number] Date of Birth: Feb 05, 1941

## 2023-05-13 ENCOUNTER — Ambulatory Visit: Payer: Medicaid Other | Attending: Oncology | Admitting: Occupational Therapy

## 2023-05-13 ENCOUNTER — Encounter: Payer: Self-pay | Admitting: Occupational Therapy

## 2023-05-13 ENCOUNTER — Other Ambulatory Visit: Payer: Self-pay | Admitting: *Deleted

## 2023-05-13 DIAGNOSIS — Z853 Personal history of malignant neoplasm of breast: Secondary | ICD-10-CM | POA: Insufficient documentation

## 2023-05-13 DIAGNOSIS — Z9013 Acquired absence of bilateral breasts and nipples: Secondary | ICD-10-CM

## 2023-05-13 DIAGNOSIS — Z08 Encounter for follow-up examination after completed treatment for malignant neoplasm: Secondary | ICD-10-CM | POA: Insufficient documentation

## 2023-05-13 DIAGNOSIS — I890 Lymphedema, not elsewhere classified: Secondary | ICD-10-CM

## 2023-05-13 DIAGNOSIS — I972 Postmastectomy lymphedema syndrome: Secondary | ICD-10-CM | POA: Insufficient documentation

## 2023-05-13 NOTE — Progress Notes (Signed)
ref

## 2023-05-13 NOTE — Therapy (Signed)
OUTPATIENT OCCUPATIONAL THERAPY Lymphedema  EVALUATION  Patient Name: Erin Wagner MRN: [id number] DOB:1940/10/29, 82 y.o., female Today's Date: 05/13/2023  PCP:Dr Beryle Flock REFERRING PROVIDER: Dr Smith Robert   END OF SESSION:  OT End of Session - 05/13/23 1914     Visit Number 1    Number of Visits 8    Date for OT Re-Evaluation 07/08/23    OT Start Time 1600    OT Stop Time 1638    OT Time Calculation (min) 38 min    Activity Tolerance Patient tolerated treatment well    Behavior During Therapy [facility] for tasks assessed/performed             Past Medical History:  Diagnosis Date   Breast cancer (HCC)    Cataract    Diabetes mellitus without complication (HCC)    Hyperlipidemia    Hypertension    Thyroid disease    Past Surgical History:  Procedure Laterality Date   BREAST SURGERY     EYE SURGERY     Patient Active Problem List   Diagnosis Date Noted   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   Urge incontinence 04/23/2023   ILD (interstitial lung disease) (HCC) 01/15/2023   Gait difficulty 01/15/2023   Chronic cough 12/06/2022   Bibasilar crackles 08/27/2022   Localized 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 fracture 02/22/2022   Hypothyroidism 05/13/2019   Essential tremor 05/13/2019   Allergic conjunctivitis of both eyes 05/13/2019   Lymphedema 12/13/2017   History of cataract 09/09/2017   Depression 09/09/2017   History of breast cancer 06/27/2017   Diabetes mellitus (HCC) 06/27/2017   Hyperlipidemia associated with type 2 diabetes mellitus (HCC) 06/27/2017   GERD (gastroesophageal reflux disease) 06/27/2017   Hypertension associated with diabetes (HCC) 06/27/2017    ONSET DATE: 2017  REFERRING DIAG: Bil UE lymphedema s/p bil mastectomy  THERAPY DIAG:  Post-mastectomy lymphedema syndrome  Rationale for Evaluation and Treatment: Rehabilitation  SUBJECTIVE:   SUBJECTIVE STATEMENT: My compression is stretched out and not helping  anymore.  I have been wearing it since I have seen you in 2021.  My left arm swelling came back the right but is doing okay. Pt accompanied by: son  PERTINENT HISTORY: Bilateral breast CA 2017 in [place], bilateral radical mastectomy - 3 positive out of 16 ln on L , 1/13 positive on R - Radiation more than 5 wks bilateral , now on tamoxifin sicne July 2019 - refer by Dr Smith Robert for lymphedema treatment - pt seen by this OT for tx in 2021- and now return her compression is not containing her lymphedema anymore.  Left upper extremity lymphedema increased in measurements in need for new compression and treatment to decrease  PRECAUTIONS: Lymphedema precautions     WEIGHT BEARING RESTRICTIONS: No  PAIN:  Are you having pain?  None verbalized  FALLS: Has patient fallen in last 6 months? No  LIVING ENVIRONMENT: Lives with: lives with their family  PLOF: Patient was fitted with a Medi Harmony compression sleeve and glove on the right.  In a custom Jobst Elvarex soft with a glove for the right time on the left with a nighttime compression PATIENT GOALS: To decrease the size of my left arm and get new compression garments    OBJECTIVE:  Note: Objective measures were completed at Evaluation unless otherwise noted.  HAND DOMINANCE: Right    COGNITION: Overall cognitive status: Within functional limits for tasks assessed TODAY'S TREATMENT:  DATE: 05/13/23 Patient arrived with her old Jobst Elvarex soft sleeve and glove on left hand with circular compression of 8 cm short stretch done by son. Since screening last week son did 1 layer bandage over nighttime sleeve.  And daytime 1 layer bandage over daytime compression.  OBSERVATIONS:   LYMPHEDEMA/ONCOLOGY QUESTIONNAIRE - 05/13/23 0001       Left Upper Extremity Lymphedema   15 cm Proximal to Olecranon Process 31 cm    10  cm Proximal to Olecranon Process 29 cm    Olecranon Process 25 cm    15 cm Proximal to Ulnar Styloid Process 26 cm    10 cm Proximal to Ulnar Styloid Process 24.7 cm    Just Proximal to Ulnar Styloid Process 16 cm    Across Hand at [facility] 17 cm             05/08/23 0001                Right Upper Extremity Lymphedema    15 cm Proximal to Olecranon Process 27 cm     10 cm Proximal to Olecranon Process 26 cm     Olecranon Process 22.6 cm     15 cm Proximal to Ulnar Styloid Process 22.5 cm     10 cm Proximal to Ulnar Styloid Process 19 cm     Just Proximal to Ulnar Styloid Process 15.3 cm     Across Hand at [facility] 18 cm          Left Upper Extremity Lymphedema    15 cm Proximal to Olecranon Process 31 cm     10 cm Proximal to Olecranon Process 30 cm     Olecranon Process 27.5 cm     15 cm Proximal to Ulnar Styloid Process 27.3 cm     10 cm Proximal to Ulnar Styloid Process 25 cm     Just Proximal to Ulnar Styloid Process 17 cm     Across Hand at [facility] 17 cm    Measurements did decreased compared to last week and compared to 2021.  Patient still increase mostly at the forearm but also elbow and upper arm.  Son educated in doing a figure 8 bandage on the forearm after doing 2 circular through hand and then elbow to upper arm circular. To do the same again over the nighttime garment in the nighttime and daytime over the old sleeve and follow-up next week.   PATIENT EDUCATION: Education details: Findings of evaluation and compared to 2021 and home exercises Person educated: Patient and Child(ren) Education method: Explanation, Demonstration, Tactile cues, and Verbal cues Education comprehension: verbalized understanding, returned demonstration, verbal cues required, tactile cues required, and needs further education  GOALS: Goals reviewed with patient? Yes  LONG TERM GOALS: Target date: 8 wks  Left upper extremity circumference decreased with  bandaging at home to within 1 cm difference from 2021 to get measured for new compression Baseline:Left upper extremity increase 2 cm at wrist 4.2 at forearm, 3.3 cm at the elbow and 2.8 in the upper arm compared to 2021  Goal status: INITIAL  2.  Patient and son to be independent in donning and doffing as well as wearing of new compression sleeves for day and nighttime; to maintain lymphedema in bilateral upper extremity Baseline: Patient garments is from 2021.  Is not containing anymore. Patient was fitted with a over-the-counter Harmony sleeve on the right and a custom Jobst on the left  with a nighttime compression on the left.  Goal status: INITIAL   ASSESSMENT:  CLINICAL IMPRESSION: Patient seen today for occupational therapy evaluation for bilateral upper extremity lymphedema.  Patient had radical mastectomy on bilateral breast in 2017.  In [place].  Patient was seen in 2021 and was fitted with over-the-counter Medi Harmony sleeve and glove for right upper extremity and left custom Jobst Elvarex soft and glove.Night time Jubilee sleeve with power sleeve for L UE -patient referred back to OT because of garments not containing lymphedema anymore.  Left upper extremity circumference increased significantly to increase 2 cm at wrist 4.2 at forearm, 3.3 cm at the elbow and 2.8 in the upper arm compared to 2021  -son was educated on using her Jubilee sleeve with circular bandage at nighttime and daytime circular's compression.  Patient can benefit from skilled OT services to decrease circumference in left upper extremity to be measured for correct daytime and nighttime compression garments to maintain lymphedema and prevent cellulitis in future.  PERFORMANCE DEFICITS: in functional skills including ADLs, IADLs, edema, skin integrity, and UE functional use, .   IMPAIRMENTS: are limiting patient from ADLs, IADLs, and rest and sleep.   COMORBIDITIES: has no other co-morbidities that affects  occupational performance. Patient will benefit from skilled OT to address above impairments and improve overall function.  MODIFICATION OR ASSISTANCE TO COMPLETE EVALUATION: No modification of tasks or assist necessary to complete an evaluation.  OT OCCUPATIONAL PROFILE AND HISTORY: Problem focused assessment: Including review of records relating to presenting problem.  CLINICAL DECISION MAKING: LOW - limited treatment options, no task modification necessary  REHAB POTENTIAL: Good  EVALUATION COMPLEXITY: Low      PLAN:  OT FREQUENCY: 1x/week  OT DURATION: 8 weeks  PLANNED INTERVENTIONS: 97535 self care/ADL training, 16109 therapeutic exercise, 97140 manual therapy, manual lymph drainage, patient/family education, and DME and/or AE instructions     CONSULTED AND AGREED WITH PLAN OF CARE: Patient and family member/caregiver      Oletta Cohn, OTR/L,CLT 05/13/2023, 7:16 PM

## 2023-05-20 ENCOUNTER — Other Ambulatory Visit: Payer: Self-pay

## 2023-05-21 ENCOUNTER — Ambulatory Visit: Payer: Medicaid Other | Admitting: Occupational Therapy

## 2023-05-21 DIAGNOSIS — I972 Postmastectomy lymphedema syndrome: Secondary | ICD-10-CM | POA: Diagnosis not present

## 2023-05-21 NOTE — Therapy (Signed)
OUTPATIENT OCCUPATIONAL THERAPY Lymphedema  TREATMENT  Patient Name: Erin Wagner MRN: [id number] DOB:1940/08/30, 82 y.o., female Today's Date: 05/21/2023  PCP:Dr Beryle Flock REFERRING PROVIDER: Dr Smith Robert   END OF SESSION:  OT End of Session - 05/21/23 1654     Visit Number 2    Number of Visits 8    Date for OT Re-Evaluation 07/08/23    OT Start Time 1615    OT Stop Time 1650    OT Time Calculation (min) 35 min    Activity Tolerance Patient tolerated treatment well    Behavior During Therapy [facility] for tasks assessed/performed             Past Medical History:  Diagnosis Date   Breast cancer (HCC)    Cataract    Diabetes mellitus without complication (HCC)    Hyperlipidemia    Hypertension    Thyroid disease    Past Surgical History:  Procedure Laterality Date   BREAST SURGERY     EYE SURGERY     Patient Active Problem List   Diagnosis Date Noted   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   Urge incontinence 04/23/2023   ILD (interstitial lung disease) (HCC) 01/15/2023   Gait difficulty 01/15/2023   Chronic cough 12/06/2022   Bibasilar crackles 08/27/2022   Localized 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 fracture 02/22/2022   Hypothyroidism 05/13/2019   Essential tremor 05/13/2019   Allergic conjunctivitis of both eyes 05/13/2019   Lymphedema 12/13/2017   History of cataract 09/09/2017   Depression 09/09/2017   History of breast cancer 06/27/2017   Diabetes mellitus (HCC) 06/27/2017   Hyperlipidemia associated with type 2 diabetes mellitus (HCC) 06/27/2017   GERD (gastroesophageal reflux disease) 06/27/2017   Hypertension associated with diabetes (HCC) 06/27/2017    ONSET DATE: 2017  REFERRING DIAG: Bil UE lymphedema s/p bil mastectomy  THERAPY DIAG:  Post-mastectomy lymphedema syndrome  Rationale for Evaluation and Treatment: Rehabilitation  SUBJECTIVE:   SUBJECTIVE STATEMENT: My D-I-L done the compression bandage over my old  sleeves - I think it is down and can get measure- I brought my compression sleeves in - they are all stretch out -   I have been wearing it since I have seen you in 2021.  Pt accompanied by: son  PERTINENT HISTORY: Bilateral breast CA 2017 in [place], bilateral radical mastectomy - 3 positive out of 16 ln on L , 1/13 positive on R - Radiation more than 5 wks bilateral , now on tamoxifin sicne July 2019 - refer by Dr Smith Robert for lymphedema treatment - pt seen by this OT for tx in 2021- and now return her compression is not containing her lymphedema anymore.  Left upper extremity lymphedema increased in measurements in need for new compression and treatment to decrease  PRECAUTIONS: Lymphedema precautions     WEIGHT BEARING RESTRICTIONS: No  PAIN:  Are you having pain?  None verbalized  FALLS: Has patient fallen in last 6 months? No  LIVING ENVIRONMENT: Lives with: lives with their family  PLOF: Patient was fitted with a Medi Harmony compression sleeve and glove on the right.  In a custom Jobst Elvarex soft with a glove for the right time on the left with a nighttime compression PATIENT GOALS: To decrease the size of my left arm and get new compression garments    OBJECTIVE:  Note: Objective measures were completed at Evaluation unless otherwise noted.  HAND DOMINANCE: Right    COGNITION: Overall cognitive status: Within functional limits for tasks  assessed TODAY'S TREATMENT:                                                                                                                              DATE: 05/21/23 Patient arrived with her old Jobst Elvarex soft sleeve and glove on left hand with Figure 8 compression 8 cm short stretch bandage over sleeve Since seen last week they did at home 1 layer bandage over nighttime sleeve and daytime sleeve -off only for ADL's.  OBSERVATIONS:   LYMPHEDEMA/ONCOLOGY QUESTIONNAIRE - 05/21/23 0001       Right Upper Extremity Lymphedema   15 cm  Proximal to Olecranon Process 26.5 cm    10 cm Proximal to Olecranon Process 26.6 cm    Olecranon Process 22.5 cm    15 cm Proximal to Ulnar Styloid Process 22 cm    10 cm Proximal to Ulnar Styloid Process 19 cm    Just Proximal to Ulnar Styloid Process 15.3 cm    Across Hand at [facility] 17 cm      Left Upper Extremity Lymphedema   15 cm Proximal to Olecranon Process 30.5 cm    10 cm Proximal to Olecranon Process 29 cm    Olecranon Process 24.4 cm    15 cm Proximal to Ulnar Styloid Process 24 cm    10 cm Proximal to Ulnar Styloid Process 21.4 cm    Just Proximal to Ulnar Styloid Process 15.8 cm    Across Hand at [facility] 17 cm             05/08/23 0001                Right Upper Extremity Lymphedema    15 cm Proximal to Olecranon Process 27 cm     10 cm Proximal to Olecranon Process 26 cm     Olecranon Process 22.6 cm     15 cm Proximal to Ulnar Styloid Process 22.5 cm     10 cm Proximal to Ulnar Styloid Process 19 cm     Just Proximal to Ulnar Styloid Process 15.3 cm     Across Hand at [facility] 18 cm          Left Upper Extremity Lymphedema    15 cm Proximal to Olecranon Process 31 cm     10 cm Proximal to Olecranon Process 30 cm     Olecranon Process 27.5 cm     15 cm Proximal to Ulnar Styloid Process 27.3 cm     10 cm Proximal to Ulnar Styloid Process 25 cm     Just Proximal to Ulnar Styloid Process 17 cm     Across Hand at [facility] 17 cm    Measurements on L UE decrease to same as 2021- R UE same as 2021.  Son educated again in doing a figure 8 bandage on the forearm after doing 2 circular through hand and then elbow  to upper arm circular. To do the same again over the nighttime garment in the nighttime and daytime over the old sleeve Need to get with [facility] to measure and fit over the counter Medi Harmony sleeve and glove for R UE and custom Jobst Elvarex soft sleeve and glove for L UE -  New night time Tribute sleeve with power  sleeve Cont bandages and follow up with me when she gets new compression to assess fit    PATIENT EDUCATION: Education details: Findings of evaluation and compared to 2021 and home exercises Person educated: Patient and Child(ren) Education method: Explanation, Demonstration, Tactile cues, and Verbal cues Education comprehension: verbalized understanding, returned demonstration, verbal cues required, tactile cues required, and needs further education  GOALS: Goals reviewed with patient? Yes  LONG TERM GOALS: Target date: 8 wks  Left upper extremity circumference decreased with bandaging at home to within 1 cm difference from 2021 to get measured for new compression Baseline:Left upper extremity increase 2 cm at wrist 4.2 at forearm, 3.3 cm at the elbow and 2.8 in the upper arm compared to 2021  Goal status: MET  2.  Patient and son to be independent in donning and doffing as well as wearing of new compression sleeves for day and nighttime; to maintain lymphedema in bilateral upper extremity Baseline: Patient garments is from 2021.  Is not containing anymore. Patient was fitted with a over-the-counter Harmony sleeve on the right and a custom Jobst on the left with a nighttime compression on the left.  Goal status: Progressing    ASSESSMENT:  CLINICAL IMPRESSION: Patient seen for occupational therapy for bilateral upper extremity lymphedema.  Patient had radical mastectomy on bilateral breast in 2017.  In [place].  Patient was seen in 2021 and was fitted with over-the-counter Medi Harmony sleeve and glove for right upper extremity and left custom Jobst Elvarex soft and glove.Night time Jubilee sleeve with power sleeve for L UE -patient referred back to OT because of garments not containing lymphedema anymore.  Left upper extremity circumference increased significantly to increase 2 cm at wrist 4.2 at forearm, 3.3 cm at the elbow and 2.8 in the upper arm compared to 2021  -Pt present today with  great decongest of L UE - and ready to get measure for bilateral daytime compression sleeves and gloves - same as above in 2021- and assess her Jubilee sleeve and power sleeve - pt in need of new night time sleeve and power sleeve too- info send to Rep -and son toe make appt for measuring and ordering - follow up with me when she gets new compression to assess fit - Cont with  bandage at nighttime and daytime over Jubilee and daytime compression.  Patient can benefit from skilled OT services to decrease circumference in left upper extremity to be measured for correct daytime and nighttime compression garments to maintain lymphedema and prevent cellulitis in future.  PERFORMANCE DEFICITS: in functional skills including ADLs, IADLs, edema, skin integrity, and UE functional use, .   IMPAIRMENTS: are limiting patient from ADLs, IADLs, and rest and sleep.   COMORBIDITIES: has no other co-morbidities that affects occupational performance. Patient will benefit from skilled OT to address above impairments and improve overall function.  MODIFICATION OR ASSISTANCE TO COMPLETE EVALUATION: No modification of tasks or assist necessary to complete an evaluation.  OT OCCUPATIONAL PROFILE AND HISTORY: Problem focused assessment: Including review of records relating to presenting problem.  CLINICAL DECISION MAKING: LOW - limited treatment options, no task  modification necessary  REHAB POTENTIAL: Good  EVALUATION COMPLEXITY: Low      PLAN:  OT FREQUENCY: 1x/week  OT DURATION: 8 weeks  PLANNED INTERVENTIONS: 97535 self care/ADL training, 96295 therapeutic exercise, 97140 manual therapy, manual lymph drainage, patient/family education, and DME and/or AE instructions     CONSULTED AND AGREED WITH PLAN OF CARE: Patient and family member/caregiver      Oletta Cohn, OTR/L,CLT 05/21/2023, 4:56 PM

## 2023-05-22 ENCOUNTER — Telehealth: Payer: Self-pay | Admitting: *Deleted

## 2023-05-22 NOTE — Telephone Encounter (Signed)
Marine did an evaluation of the lymphedema and sent me a request for :Can you please send order to [employer] for Erin Wagner - for " Custom Jobs Elvarex soft sleeve and glove for L arm lymphedema and night time Jubilee sleeve with powersleeve - and Medi Harmony sleeve AND glove for R UE lymphedema   I wrote up the necessary information to get the lymphedema sleeves and gloves.  Dr. Smith Robert approved this and signed off and I sent the information to [name] and the transmission went through

## 2023-06-04 ENCOUNTER — Encounter: Payer: Self-pay | Admitting: Family Medicine

## 2023-06-05 ENCOUNTER — Other Ambulatory Visit: Payer: Self-pay | Admitting: Family Medicine

## 2023-06-05 ENCOUNTER — Other Ambulatory Visit: Payer: Self-pay

## 2023-06-05 MED ORDER — OMEPRAZOLE 20 MG PO CPDR
20.0000 mg | DELAYED_RELEASE_CAPSULE | Freq: Every day | ORAL | 0 refills | Status: DC
  Filled 2023-06-05: qty 90, 90d supply, fill #0

## 2023-06-05 NOTE — Telephone Encounter (Signed)
Rx 03/11/23 #45 1RF- too soon Requested Prescriptions  Pending Prescriptions Disp Refills   levothyroxine (SYNTHROID) 25 MCG tablet 45 tablet 1    Sig: Take 1 tablet (25 mcg total) by mouth daily.     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   [facility] [place], Marzella Schlein, MD   3 months ago Type 2 diabetes mellitus with other specified complication, without long-term current use of insulin [facility])   [employer] [facility] [place], Marzella Schlein, MD   4 months ago ILD (interstitial lung disease) [facility])   [place] [facility] Beryle Flock, Marzella Schlein, MD   9 months ago Encounter for annual physical exam   [facility] Erasmo Downer, MD   11 months ago Acute bacterial bronchitis   [facility] Jacky Kindle, FNP       Future Appointments             In 2 months Bacigalupo, Marzella Schlein, MD [facility], [facility]

## 2023-06-05 NOTE — Telephone Encounter (Signed)
Requested medication (s) are due for refill today:   No being requested too soon  Requested medication (s) are on the active medication list:   Yes  Future visit scheduled:   Yes  in 2 months with Dr. Beryle Flock   Last ordered: 03/14/2023 #45, 1 refill  Returned for provider review to refill before the Thanksgiving holiday at pt's request. Too soon per our protocol to refill.      Requested Prescriptions  Pending Prescriptions Disp Refills   levothyroxine (SYNTHROID) 25 MCG tablet 45 tablet 1    Sig: Take 1 tablet (25 mcg total) by mouth daily.     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   [place] [facility] [place], Marzella Schlein, MD   3 months ago Type 2 diabetes mellitus with other specified complication, without long-term current use of insulin [facility])   [place] [facility] [place], Marzella Schlein, MD   4 months ago ILD (interstitial lung disease) [facility])   [place] [facility] Beryle Flock, Marzella Schlein, MD   9 months ago Encounter for annual physical exam   [facility] Erasmo Downer, MD   11 months ago Acute bacterial bronchitis   [facility] Jacky Kindle, FNP       Future Appointments             In 2 months Bacigalupo, Marzella Schlein, MD [facility], [facility]

## 2023-06-11 ENCOUNTER — Other Ambulatory Visit: Payer: Self-pay | Admitting: Family Medicine

## 2023-06-11 ENCOUNTER — Other Ambulatory Visit: Payer: Self-pay

## 2023-06-12 ENCOUNTER — Other Ambulatory Visit: Payer: Self-pay

## 2023-06-12 MED FILL — Levothyroxine Sodium Tab 25 MCG: ORAL | 90 days supply | Qty: 90 | Fill #0 | Status: AC

## 2023-06-14 ENCOUNTER — Other Ambulatory Visit: Payer: Self-pay

## 2023-06-16 ENCOUNTER — Encounter: Payer: Self-pay | Admitting: Student in an Organized Health Care Education/Training Program

## 2023-06-16 DIAGNOSIS — R0602 Shortness of breath: Secondary | ICD-10-CM

## 2023-06-16 DIAGNOSIS — J849 Interstitial pulmonary disease, unspecified: Secondary | ICD-10-CM

## 2023-06-18 ENCOUNTER — Other Ambulatory Visit: Payer: Self-pay

## 2023-06-18 MED ORDER — BUDESONIDE 0.5 MG/2ML IN SUSP
0.5000 mg | Freq: Two times a day (BID) | RESPIRATORY_TRACT | 11 refills | Status: DC
  Filled 2023-06-18 (×3): qty 120, 30d supply, fill #0

## 2023-06-18 NOTE — Telephone Encounter (Signed)
Per secure chat from Dr. Magdalene Patricia an order for a nebulizer and pulmicort

## 2023-06-18 NOTE — Addendum Note (Signed)
Addended by: Lajoyce Lauber A on: 06/18/2023 03:16 PM   Modules accepted: Orders

## 2023-06-19 ENCOUNTER — Encounter: Payer: Self-pay | Admitting: *Deleted

## 2023-06-19 ENCOUNTER — Emergency Department: Payer: Medicaid Other

## 2023-06-19 ENCOUNTER — Other Ambulatory Visit: Payer: Self-pay

## 2023-06-19 ENCOUNTER — Inpatient Hospital Stay
Admission: EM | Admit: 2023-06-19 | Discharge: 2023-07-10 | DRG: 871 | Disposition: E | Payer: Medicaid Other | Attending: Internal Medicine | Admitting: Internal Medicine

## 2023-06-19 DIAGNOSIS — Z1152 Encounter for screening for COVID-19: Secondary | ICD-10-CM

## 2023-06-19 DIAGNOSIS — Z66 Do not resuscitate: Secondary | ICD-10-CM | POA: Diagnosis present

## 2023-06-19 DIAGNOSIS — E039 Hypothyroidism, unspecified: Secondary | ICD-10-CM | POA: Diagnosis present

## 2023-06-19 DIAGNOSIS — R7989 Other specified abnormal findings of blood chemistry: Secondary | ICD-10-CM | POA: Diagnosis not present

## 2023-06-19 DIAGNOSIS — A419 Sepsis, unspecified organism: Secondary | ICD-10-CM | POA: Diagnosis present

## 2023-06-19 DIAGNOSIS — R6521 Severe sepsis with septic shock: Secondary | ICD-10-CM | POA: Insufficient documentation

## 2023-06-19 DIAGNOSIS — I272 Pulmonary hypertension, unspecified: Secondary | ICD-10-CM | POA: Diagnosis present

## 2023-06-19 DIAGNOSIS — E038 Other specified hypothyroidism: Secondary | ICD-10-CM | POA: Diagnosis not present

## 2023-06-19 DIAGNOSIS — E1165 Type 2 diabetes mellitus with hyperglycemia: Secondary | ICD-10-CM | POA: Insufficient documentation

## 2023-06-19 DIAGNOSIS — R6520 Severe sepsis without septic shock: Secondary | ICD-10-CM

## 2023-06-19 DIAGNOSIS — K219 Gastro-esophageal reflux disease without esophagitis: Secondary | ICD-10-CM | POA: Insufficient documentation

## 2023-06-19 DIAGNOSIS — Z555 Less than a high school diploma: Secondary | ICD-10-CM

## 2023-06-19 DIAGNOSIS — D509 Iron deficiency anemia, unspecified: Secondary | ICD-10-CM | POA: Insufficient documentation

## 2023-06-19 DIAGNOSIS — F32A Depression, unspecified: Secondary | ICD-10-CM | POA: Diagnosis present

## 2023-06-19 DIAGNOSIS — Z6827 Body mass index (BMI) 27.0-27.9, adult: Secondary | ICD-10-CM

## 2023-06-19 DIAGNOSIS — Z853 Personal history of malignant neoplasm of breast: Secondary | ICD-10-CM | POA: Diagnosis not present

## 2023-06-19 DIAGNOSIS — N179 Acute kidney failure, unspecified: Secondary | ICD-10-CM | POA: Diagnosis present

## 2023-06-19 DIAGNOSIS — E878 Other disorders of electrolyte and fluid balance, not elsewhere classified: Secondary | ICD-10-CM | POA: Diagnosis present

## 2023-06-19 DIAGNOSIS — I10 Essential (primary) hypertension: Secondary | ICD-10-CM | POA: Diagnosis present

## 2023-06-19 DIAGNOSIS — R54 Age-related physical debility: Secondary | ICD-10-CM | POA: Diagnosis present

## 2023-06-19 DIAGNOSIS — I509 Heart failure, unspecified: Secondary | ICD-10-CM | POA: Diagnosis not present

## 2023-06-19 DIAGNOSIS — J9601 Acute respiratory failure with hypoxia: Secondary | ICD-10-CM

## 2023-06-19 DIAGNOSIS — E440 Moderate protein-calorie malnutrition: Secondary | ICD-10-CM | POA: Diagnosis present

## 2023-06-19 DIAGNOSIS — Z7983 Long term (current) use of bisphosphonates: Secondary | ICD-10-CM

## 2023-06-19 DIAGNOSIS — Z79890 Hormone replacement therapy (postmenopausal): Secondary | ICD-10-CM

## 2023-06-19 DIAGNOSIS — K5900 Constipation, unspecified: Secondary | ICD-10-CM | POA: Insufficient documentation

## 2023-06-19 DIAGNOSIS — Z79810 Long term (current) use of selective estrogen receptor modulators (SERMs): Secondary | ICD-10-CM

## 2023-06-19 DIAGNOSIS — E872 Acidosis, unspecified: Secondary | ICD-10-CM | POA: Diagnosis present

## 2023-06-19 DIAGNOSIS — E785 Hyperlipidemia, unspecified: Secondary | ICD-10-CM | POA: Diagnosis present

## 2023-06-19 DIAGNOSIS — E871 Hypo-osmolality and hyponatremia: Secondary | ICD-10-CM

## 2023-06-19 DIAGNOSIS — Z9013 Acquired absence of bilateral breasts and nipples: Secondary | ICD-10-CM

## 2023-06-19 DIAGNOSIS — J470 Bronchiectasis with acute lower respiratory infection: Secondary | ICD-10-CM | POA: Diagnosis present

## 2023-06-19 DIAGNOSIS — Z9221 Personal history of antineoplastic chemotherapy: Secondary | ICD-10-CM

## 2023-06-19 DIAGNOSIS — Z515 Encounter for palliative care: Secondary | ICD-10-CM

## 2023-06-19 DIAGNOSIS — E875 Hyperkalemia: Secondary | ICD-10-CM | POA: Diagnosis present

## 2023-06-19 DIAGNOSIS — J9621 Acute and chronic respiratory failure with hypoxia: Secondary | ICD-10-CM | POA: Diagnosis present

## 2023-06-19 DIAGNOSIS — I5A Non-ischemic myocardial injury (non-traumatic): Secondary | ICD-10-CM

## 2023-06-19 DIAGNOSIS — Z7189 Other specified counseling: Secondary | ICD-10-CM | POA: Diagnosis not present

## 2023-06-19 DIAGNOSIS — Z7984 Long term (current) use of oral hypoglycemic drugs: Secondary | ICD-10-CM | POA: Diagnosis not present

## 2023-06-19 DIAGNOSIS — K5909 Other constipation: Secondary | ICD-10-CM | POA: Diagnosis not present

## 2023-06-19 DIAGNOSIS — I214 Non-ST elevation (NSTEMI) myocardial infarction: Secondary | ICD-10-CM

## 2023-06-19 DIAGNOSIS — I21A1 Myocardial infarction type 2: Secondary | ICD-10-CM | POA: Diagnosis present

## 2023-06-19 DIAGNOSIS — F3289 Other specified depressive episodes: Secondary | ICD-10-CM | POA: Diagnosis not present

## 2023-06-19 DIAGNOSIS — J849 Interstitial pulmonary disease, unspecified: Secondary | ICD-10-CM

## 2023-06-19 DIAGNOSIS — D508 Other iron deficiency anemias: Secondary | ICD-10-CM | POA: Diagnosis not present

## 2023-06-19 DIAGNOSIS — Z923 Personal history of irradiation: Secondary | ICD-10-CM

## 2023-06-19 DIAGNOSIS — E119 Type 2 diabetes mellitus without complications: Secondary | ICD-10-CM | POA: Diagnosis not present

## 2023-06-19 DIAGNOSIS — J189 Pneumonia, unspecified organism: Principal | ICD-10-CM

## 2023-06-19 DIAGNOSIS — I959 Hypotension, unspecified: Secondary | ICD-10-CM | POA: Diagnosis present

## 2023-06-19 DIAGNOSIS — R0602 Shortness of breath: Secondary | ICD-10-CM | POA: Diagnosis not present

## 2023-06-19 DIAGNOSIS — Z79899 Other long term (current) drug therapy: Secondary | ICD-10-CM

## 2023-06-19 LAB — TROPONIN I (HIGH SENSITIVITY)
Troponin I (High Sensitivity): 104 ng/L (ref <18)
Troponin I (High Sensitivity): 91 ng/L — ABNORMAL HIGH (ref <18)

## 2023-06-19 LAB — COMPREHENSIVE METABOLIC PANEL
ALT: 53 U/L — ABNORMAL HIGH (ref 0–44)
AST: 89 U/L — ABNORMAL HIGH (ref 15–41)
Albumin: 3 g/dL — ABNORMAL LOW (ref 3.5–5.0)
Alkaline Phosphatase: 96 U/L (ref 38–126)
Anion gap: 10 (ref 5–15)
BUN: 29 mg/dL — ABNORMAL HIGH (ref 8–23)
CO2: 20 mmol/L — ABNORMAL LOW (ref 22–32)
Calcium: 9.6 mg/dL (ref 8.9–10.3)
Chloride: 97 mmol/L — ABNORMAL LOW (ref 98–111)
Creatinine, Ser: 1.2 mg/dL — ABNORMAL HIGH (ref 0.44–1.00)
GFR, Estimated: 45 mL/min — ABNORMAL LOW (ref >60)
Glucose, Bld: 182 mg/dL — ABNORMAL HIGH (ref 70–99)
Potassium: 5.2 mmol/L — ABNORMAL HIGH (ref 3.5–5.1)
Sodium: 127 mmol/L — ABNORMAL LOW (ref 135–145)
Total Bilirubin: 0.5 mg/dL (ref <1.2)
Total Protein: 7.5 g/dL (ref 6.5–8.1)

## 2023-06-19 LAB — CBC
HCT: 27.6 % — ABNORMAL LOW (ref 36.0–46.0)
Hemoglobin: 8.8 g/dL — ABNORMAL LOW (ref 12.0–15.0)
MCH: 24 pg — ABNORMAL LOW (ref 26.0–34.0)
MCHC: 31.9 g/dL (ref 30.0–36.0)
MCV: 75.2 fL — ABNORMAL LOW (ref 80.0–100.0)
Platelets: 488 10*3/uL — ABNORMAL HIGH (ref 150–400)
RBC: 3.67 MIL/uL — ABNORMAL LOW (ref 3.87–5.11)
RDW: 15 % (ref 11.5–15.5)
WBC: 13.5 10*3/uL — ABNORMAL HIGH (ref 4.0–10.5)
nRBC: 0 % (ref 0.0–0.2)

## 2023-06-19 LAB — LACTIC ACID, PLASMA
Lactic Acid, Venous: 4.7 mmol/L (ref 0.5–1.9)
Lactic Acid, Venous: 3.2 mmol/L (ref 0.5–1.9)

## 2023-06-19 LAB — PROTIME-INR
INR: 1.1 (ref 0.8–1.2)
Prothrombin Time: 14.5 s (ref 11.4–15.2)

## 2023-06-19 LAB — T4, FREE: Free T4: 1.75 ng/dL — ABNORMAL HIGH (ref 0.61–1.12)

## 2023-06-19 LAB — RESP PANEL BY RT-PCR (RSV, FLU A&B, COVID)  RVPGX2
Influenza A by PCR: NEGATIVE
Influenza B by PCR: NEGATIVE
Resp Syncytial Virus by PCR: NEGATIVE
SARS Coronavirus 2 by RT PCR: NEGATIVE

## 2023-06-19 LAB — APTT: aPTT: 39 s — ABNORMAL HIGH (ref 24–36)

## 2023-06-19 LAB — TSH: TSH: 2.165 u[IU]/mL (ref 0.350–4.500)

## 2023-06-19 MED ORDER — ENOXAPARIN SODIUM 30 MG/0.3ML IJ SOSY
30.0000 mg | PREFILLED_SYRINGE | INTRAMUSCULAR | Status: DC
  Administered 2023-06-20 – 2023-06-29 (×10): 30 mg via SUBCUTANEOUS
  Filled 2023-06-19 (×11): qty 0.3

## 2023-06-19 MED ORDER — MAGNESIUM HYDROXIDE 400 MG/5ML PO SUSP
30.0000 mL | Freq: Every day | ORAL | Status: DC | PRN
  Administered 2023-06-26: 30 mL via ORAL
  Filled 2023-06-19: qty 30

## 2023-06-19 MED ORDER — ONDANSETRON HCL 4 MG PO TABS: 4.0000 mg | ORAL_TABLET | Freq: Four times a day (QID) | ORAL | Status: DC | PRN

## 2023-06-19 MED ORDER — ONDANSETRON HCL 4 MG/2ML IJ SOLN: 4.0000 mg | Freq: Four times a day (QID) | INTRAMUSCULAR | Status: DC | PRN

## 2023-06-19 MED ORDER — SODIUM CHLORIDE 0.9 % IV BOLUS
500.0000 mL | Freq: Once | INTRAVENOUS | Status: AC
  Administered 2023-06-19: 500 mL via INTRAVENOUS

## 2023-06-19 MED ORDER — IPRATROPIUM-ALBUTEROL 0.5-2.5 (3) MG/3ML IN SOLN
3.0000 mL | Freq: Four times a day (QID) | RESPIRATORY_TRACT | Status: DC
Start: 2023-06-19 — End: 2023-06-29
  Administered 2023-06-19 – 2023-06-29 (×37): 3 mL via RESPIRATORY_TRACT
  Filled 2023-06-19 (×35): qty 3

## 2023-06-19 MED ORDER — SODIUM CHLORIDE 0.9 % IV SOLN: 2.0000 g | INTRAVENOUS | Status: DC

## 2023-06-19 MED ORDER — TRAZODONE HCL 50 MG PO TABS
25.0000 mg | ORAL_TABLET | Freq: Every evening | ORAL | Status: DC | PRN
  Administered 2023-06-20 – 2023-06-25 (×5): 25 mg via ORAL
  Filled 2023-06-19 (×5): qty 1

## 2023-06-19 MED ORDER — HYDROCORTISONE SOD SUC (PF) 100 MG IJ SOLR
100.0000 mg | Freq: Once | INTRAMUSCULAR | Status: AC
  Administered 2023-06-19: 100 mg via INTRAVENOUS
  Filled 2023-06-19: qty 2

## 2023-06-19 MED ORDER — PROPRANOLOL HCL 20 MG PO TABS
20.0000 mg | ORAL_TABLET | Freq: Two times a day (BID) | ORAL | Status: DC
  Administered 2023-06-20: 20 mg via ORAL
  Filled 2023-06-19 (×2): qty 1

## 2023-06-19 MED ORDER — SODIUM CHLORIDE 0.9 % IV SOLN: 500.0000 mg | INTRAVENOUS | Status: DC

## 2023-06-19 MED ORDER — GUAIFENESIN ER 600 MG PO TB12
600.0000 mg | ORAL_TABLET | Freq: Two times a day (BID) | ORAL | Status: DC
  Administered 2023-06-19 – 2023-06-28 (×17): 600 mg via ORAL
  Filled 2023-06-19 (×18): qty 1

## 2023-06-19 MED ORDER — PANTOPRAZOLE SODIUM 40 MG PO TBEC
40.0000 mg | DELAYED_RELEASE_TABLET | Freq: Every day | ORAL | Status: DC
Start: 2023-06-20 — End: 2023-06-30
  Administered 2023-06-20 – 2023-06-29 (×10): 40 mg via ORAL
  Filled 2023-06-19 (×10): qty 1

## 2023-06-19 MED ORDER — SODIUM CHLORIDE 0.9 % IV SOLN
500.0000 mg | Freq: Once | INTRAVENOUS | Status: AC
  Administered 2023-06-19: 500 mg via INTRAVENOUS
  Filled 2023-06-19: qty 5

## 2023-06-19 MED ORDER — LEVOTHYROXINE SODIUM 50 MCG PO TABS
25.0000 ug | ORAL_TABLET | Freq: Every day | ORAL | Status: DC
  Administered 2023-06-20 – 2023-06-30 (×11): 25 ug via ORAL
  Filled 2023-06-19 (×11): qty 1

## 2023-06-19 MED ORDER — HYDROCOD POLI-CHLORPHE POLI ER 10-8 MG/5ML PO SUER: 5.0000 mL | Freq: Two times a day (BID) | ORAL | Status: DC | PRN

## 2023-06-19 MED ORDER — SODIUM CHLORIDE 0.9 % IV SOLN
2.0000 g | Freq: Once | INTRAVENOUS | Status: AC
  Administered 2023-06-19: 2 g via INTRAVENOUS
  Filled 2023-06-19: qty 12.5

## 2023-06-19 MED ORDER — ACETAMINOPHEN 650 MG RE SUPP: 650.0000 mg | Freq: Four times a day (QID) | RECTAL | Status: DC | PRN

## 2023-06-19 MED ORDER — ATORVASTATIN CALCIUM 20 MG PO TABS
20.0000 mg | ORAL_TABLET | Freq: Every day | ORAL | Status: DC
  Administered 2023-06-20: 20 mg via ORAL
  Filled 2023-06-19: qty 1

## 2023-06-19 MED ORDER — SERTRALINE HCL 50 MG PO TABS
25.0000 mg | ORAL_TABLET | Freq: Every day | ORAL | Status: DC
  Administered 2023-06-20 – 2023-06-27 (×8): 25 mg via ORAL
  Filled 2023-06-19 (×8): qty 1

## 2023-06-19 MED ORDER — BUDESONIDE 0.5 MG/2ML IN SUSP
0.5000 mg | Freq: Once | RESPIRATORY_TRACT | Status: AC
  Administered 2023-06-19: 0.5 mg via RESPIRATORY_TRACT
  Filled 2023-06-19: qty 2

## 2023-06-19 MED ORDER — VANCOMYCIN HCL IN DEXTROSE 1-5 GM/200ML-% IV SOLN
1000.0000 mg | Freq: Once | INTRAVENOUS | Status: AC
  Administered 2023-06-19: 1000 mg via INTRAVENOUS
  Filled 2023-06-19: qty 200

## 2023-06-19 MED ORDER — BUDESONIDE 0.5 MG/2ML IN SUSP
0.5000 mg | Freq: Two times a day (BID) | RESPIRATORY_TRACT | Status: DC
Start: 2023-06-20 — End: 2023-06-24
  Administered 2023-06-20 – 2023-06-24 (×8): 0.5 mg via RESPIRATORY_TRACT
  Filled 2023-06-19 (×8): qty 2

## 2023-06-19 MED ORDER — ACETAMINOPHEN 325 MG PO TABS
650.0000 mg | ORAL_TABLET | Freq: Four times a day (QID) | ORAL | Status: DC | PRN
  Administered 2023-06-20: 650 mg via ORAL
  Filled 2023-06-19: qty 2

## 2023-06-19 MED ORDER — FAMOTIDINE 20 MG PO TABS
10.0000 mg | ORAL_TABLET | Freq: Two times a day (BID) | ORAL | Status: DC
  Administered 2023-06-19 – 2023-06-20 (×2): 10 mg via ORAL
  Filled 2023-06-19 (×3): qty 1

## 2023-06-19 MED ORDER — SODIUM CHLORIDE 0.9 % IV BOLUS
1000.0000 mL | Freq: Once | INTRAVENOUS | Status: AC
  Administered 2023-06-19: 1000 mL via INTRAVENOUS

## 2023-06-19 MED ORDER — OYSTER SHELL CALCIUM/D3 500-5 MG-MCG PO TABS
1.0000 | ORAL_TABLET | Freq: Every day | ORAL | Status: DC
  Administered 2023-06-19 – 2023-06-20 (×2): 1 via ORAL
  Filled 2023-06-19 (×2): qty 1

## 2023-06-19 MED ORDER — LORATADINE 10 MG PO TABS
10.0000 mg | ORAL_TABLET | Freq: Every day | ORAL | Status: DC
  Administered 2023-06-20 – 2023-06-26 (×7): 10 mg via ORAL
  Filled 2023-06-19 (×7): qty 1

## 2023-06-19 MED ORDER — LACTATED RINGERS IV SOLN
150.0000 mL/h | INTRAVENOUS | Status: AC
  Administered 2023-06-19: 150 mL/h via INTRAVENOUS

## 2023-06-19 NOTE — Assessment & Plan Note (Addendum)
On Protonix 

## 2023-06-19 NOTE — ED Triage Notes (Signed)
Pt to triage via wheelchair.  Pt reports sob,weakness and a cough.  Yesterday, pt slipped in the bathroom and hit face on a handle.  No loc.  No no n/v/  no headache.  Abrasion to right side of face.  Pt alert.

## 2023-06-19 NOTE — Assessment & Plan Note (Addendum)
Synthroid discontinued

## 2023-06-19 NOTE — Assessment & Plan Note (Addendum)
 Holding atorvastatin.

## 2023-06-19 NOTE — Assessment & Plan Note (Addendum)
Patient with exacerbation and did not improve with her oxygenation status during the 11 days in the hospital.

## 2023-06-19 NOTE — Assessment & Plan Note (Addendum)
Zoloft discontinued

## 2023-06-19 NOTE — ED Provider Notes (Signed)
[facility] Provider Note    Event Date/Time   First MD Initiated Contact with Patient 06/19/23 1835     (approximate)   History   Shortness of Breath and Cough   HPI  Erin Wagner is a 82 y.o. female   Past medical history of breast cancer in remission, diabetes, hypertension, hyperlipidemia, thyroid disease, interstitial lung disease who presents to the emergency department with cough and shortness of breath for the last 7 days.  This started right after she completed the steroid taper.  She has a productive cough.  She has increased work of breathing.  She denies chest pain.  She denies fever.  She has a new oxygen requirement was hypoxemic 86% on room air.  History was obtained via [place] interpreter  Independent Historian contributed to assessment above: Family members at bedside to corroborate information past medical history as above  External Medical Documents Reviewed: Dr. Aundria Rud pulmonary earlier this year with documented past medical history and pulmonary disease      Physical Exam   Triage Vital Signs: ED Triage Vitals  Encounter Vitals Group     BP 06/19/23 1805 (!) 98/50     Systolic BP Percentile --      Diastolic BP Percentile --      Pulse Rate 06/19/23 1805 99     Resp 06/19/23 1805 (!) 36     Temp 06/19/23 1805 98.3 F (36.8 C)     Temp Source 06/19/23 1805 Oral     SpO2 06/19/23 1829 (!) 86 %     Weight 06/19/23 1803 101 lb 6.6 oz (46 kg)     Height 06/19/23 1803 4\' 3"  (1.295 m)     Head Circumference --      Peak Flow --      Pain Score 06/19/23 1803 10     Pain Loc --      Pain Education --      Exclude from Growth Chart --     Most recent vital signs: Vitals:   06/19/23 1829 06/19/23 1900  BP:  91/60  Pulse:  96  Resp:  (!) 25  Temp:    SpO2: (!) 86% 96%    General: Awake, no distress.  CV:  Good peripheral perfusion.  Resp:  Normal effort.  Abd:  No distention.  Other:  Ronchi bilateral  bases.  Hypotensive 90s over 50s.  Tachypneic 30s.  Tachycardic 100.  Afebrile.  Hypoxemic.   ED Results / Procedures / Treatments   Labs (all labs ordered are listed, but only abnormal results are displayed) Labs Reviewed  CBC - Abnormal; Notable for the following components:      Result Value   WBC 13.5 (*)    RBC 3.67 (*)    Hemoglobin 8.8 (*)    HCT 27.6 (*)    MCV 75.2 (*)    MCH 24.0 (*)    Platelets 488 (*)    All other components within normal limits  LACTIC ACID, PLASMA - Abnormal; Notable for the following components:   Lactic Acid, Venous 4.7 (*)    All other components within normal limits  COMPREHENSIVE METABOLIC PANEL - Abnormal; Notable for the following components:   Sodium 127 (*)    Potassium 5.2 (*)    Chloride 97 (*)    CO2 20 (*)    Glucose, Bld 182 (*)    BUN 29 (*)    Creatinine, Ser 1.20 (*)    Albumin 3.0 (*)  AST 89 (*)    ALT 53 (*)    GFR, Estimated 45 (*)    All other components within normal limits  APTT - Abnormal; Notable for the following components:   aPTT 39 (*)    All other components within normal limits  T4, FREE - Abnormal; Notable for the following components:   Free T4 1.75 (*)    All other components within normal limits  TROPONIN I (HIGH SENSITIVITY) - Abnormal; Notable for the following components:   Troponin I (High Sensitivity) 104 (*)    All other components within normal limits  CULTURE, BLOOD (ROUTINE X 2)  CULTURE, BLOOD (ROUTINE X 2)  RESP PANEL BY RT-PCR (RSV, FLU A&B, COVID)  RVPGX2  PROTIME-INR  TSH  LACTIC ACID, PLASMA  URINALYSIS, W/ REFLEX TO CULTURE (INFECTION SUSPECTED)  TROPONIN I (HIGH SENSITIVITY)     I ordered and reviewed the above labs they are notable for opponent in the 100.  Leukocytosis.  Hyponatremia.  EKG  ED ECG REPORT I, Pilar Jarvis, the attending physician, personally viewed and interpreted this ECG.   Date: 06/19/2023  EKG Time: 1824  Rate: 94  Rhythm: sinus  Axis: nl   Intervals:none  ST&T Change: no stemi    RADIOLOGY I independently reviewed and interpreted chest x-ray and see a left-sided opacity concerning for pneumonia I also reviewed radiologist's formal read.   PROCEDURES:  Critical Care performed: Yes, see critical care procedure note(s)  .Critical Care  Performed by: Pilar Jarvis, MD Authorized by: Pilar Jarvis, MD   Critical care provider statement:    Critical care time (minutes):  30   Critical care was time spent personally by me on the following activities:  Development of treatment plan with patient or surrogate, discussions with consultants, evaluation of patient's response to treatment, examination of patient, ordering and review of laboratory studies, ordering and review of radiographic studies, ordering and performing treatments and interventions, pulse oximetry, re-evaluation of patient's condition and review of old charts    MEDICATIONS ORDERED IN ED: Medications  ceFEPIme (MAXIPIME) 2 g in sodium chloride 0.9 % 100 mL IVPB (2 g Intravenous New Bag/Given 06/19/23 1957)  vancomycin (VANCOCIN) IVPB 1000 mg/200 mL premix (has no administration in time range)  azithromycin (ZITHROMAX) 500 mg in sodium chloride 0.9 % 250 mL IVPB (has no administration in time range)  sodium chloride 0.9 % bolus 1,000 mL (1,000 mLs Intravenous New Bag/Given 06/19/23 1957)  sodium chloride 0.9 % bolus 500 mL (500 mLs Intravenous New Bag/Given 06/19/23 1957)  hydrocortisone sodium succinate (SOLU-CORTEF) 100 MG injection 100 mg (100 mg Intravenous Given 06/19/23 1959)  budesonide (PULMICORT) nebulizer solution 0.5 mg (0.5 mg Nebulization Given 06/19/23 2002)    External physician / consultants:  I spoke with hospital medicine consulted for admission and regarding care plan for this patient.   IMPRESSION / MDM / ASSESSMENT AND PLAN / ED COURSE  I reviewed the triage vital signs and the nursing notes.                                Patient's  presentation is most consistent with acute presentation with potential threat to life or bodily function.  Differential diagnosis includes, but is not limited to, pneumonia, sepsis, PE  The patient is on the cardiac monitor to evaluate for evidence of arrhythmia and/or significant heart rate changes.  MDM:    Respiratory infectious symptoms in this patient who  meet sepsis criteria with low blood pressure, tachycardia, tachypnea and leukocytosis as well as a lactic acidosis.  Given 30 cc/kg ideal body weight fluid bolus as well as antibiotics.  Given recent steroid taper will give stress dose steroid 100 mg Cortef.  Given nasal cannula oxygen with improvement in oxygenation.  NSTEMI likely demand.  Admission.       FINAL CLINICAL IMPRESSION(S) / ED DIAGNOSES   Final diagnoses:  Pneumonia due to infectious organism, unspecified laterality, unspecified part of lung  Acute hypoxemic respiratory failure (HCC)  NSTEMI (non-ST elevated myocardial infarction) (HCC)     Rx / DC Orders   ED Discharge Orders     None        Note:  This document was prepared using Dragon voice recognition software and may include unintentional dictation errors.    Pilar Jarvis, MD 06/19/23 2024

## 2023-06-19 NOTE — Assessment & Plan Note (Deleted)
-   The patient will be placed on supplemental coverage with NovoLog. - We will hold off metformin. 

## 2023-06-19 NOTE — H&P (Incomplete)
Home     [place]   PATIENT NAME: Bunnie Nerney    MR#:  [id number]  DATE OF BIRTH:  May 24, 1941  DATE OF ADMISSION:  06/19/2023  PRIMARY CARE PHYSICIAN: Erasmo Downer, MD   Patient is coming from: Home  REQUESTING/REFERRING PHYSICIAN: Pilar Jarvis, MD  CHIEF COMPLAINT:   Chief Complaint  Patient presents with   Shortness of Breath   Cough    HISTORY OF PRESENT ILLNESS:  NAKSHATRA KETTINGER is a 82 y.o. female with medical history significant for breast cancer in remission on tamoxifen, hypertension, dyslipidemia, interstitial lung disease and hypothyroidism, who presented to the emergency room with acute onset of cough and dyspnea last 7 days.  She has been having increased work of breathing productive cough after having taking a recent steroid taper.  No fever or chills.  No nausea or vomiting or abdominal pain.  No excessive wheezing.  No chest pain or palpitations.  No dysuria, oliguria or hematuria or flank pain.  ED Course: When she came to the ER, BP was 90/50 with respiratory to 36 with pulse oximetry of 86% on room air.  Labs revealed hyponatremia 127 hypochloremia of 97, hyperkalemia of 5.2, blood glucose of 182, BUN of 29 creatinine 1.2 above previous levels, albumin 3 and total protein 7.5, AST 89 and ALT 53 and high sensitive troponin I was 104 and later 91.  Lactic acid was 4.7 and later 3.2.  CBC showed leukocytosis 13.5 and anemia with hemoglobin 8.8 hematocrit 27.6 with microcytosis lower than in August and thrombocytosis of 488.  PTT was 39 with normal INR and PTT.  TSH was 2.165 and free T41.75.  Respiratory panel came back negative.  Blood cultures were drawn. EKG as reviewed by me : EKG showed normal sinus rhythm with rate of 94 with poor R wave progression with T wave inversion anterolaterally and inferiorly. Imaging: Portable chest x-ray showed the following: 1. Bilateral peripheral reticulations with diffusely increased patchy opacities,  left-greater-than-right, which may represent atypical infection superimposed on chronic interstitial lung disease. 2. Mild cardiomegaly.  The patient was given IV vancomycin, cefepime and Zithromax, Pulmicort, 100 mg of IV Solu-Medrol and 1.5 L of IV normal saline.  She will be admitted to a medical telemetry bed for further evaluation and management. PAST MEDICAL HISTORY:   Past Medical History:  Diagnosis Date   Breast cancer (HCC)    Cataract    Diabetes mellitus without complication (HCC)    Hyperlipidemia    Hypertension    Thyroid disease     PAST SURGICAL HISTORY:   Past Surgical History:  Procedure Laterality Date   BREAST SURGERY     EYE SURGERY      SOCIAL HISTORY:   Social History   Tobacco Use   Smoking status: Never   Smokeless tobacco: Never  Substance Use Topics   Alcohol use: No    FAMILY HISTORY:   Family History  Problem Relation Age of Onset   Healthy Sister    Healthy Brother    Healthy Daughter    Healthy Sister    Healthy Daughter    Healthy Daughter     DRUG ALLERGIES:  No Known Allergies  REVIEW OF SYSTEMS:   ROS As per history of present illness. All pertinent systems were reviewed above. Constitutional, HEENT, cardiovascular, respiratory, GI, GU, musculoskeletal, neuro, psychiatric, endocrine, integumentary and hematologic systems were reviewed and are otherwise negative/unremarkable except for positive findings mentioned above in the HPI.   MEDICATIONS AT  HOME:   Prior to Admission medications   Medication Sig Start Date End Date Taking? Authorizing Provider  alendronate (FOSAMAX) 70 MG tablet Take 1 tablet (70 mg total) by mouth every 7 (seven) days. Take with a full glass of water on an empty stomach. 02/05/23   Simmons-Robinson, Makiera, MD  atorvastatin (LIPITOR) 20 MG tablet Take 1 tablet (20 mg total) by mouth daily. 08/27/22   Erasmo Downer, MD  budesonide (PULMICORT) 0.5 MG/2ML nebulizer solution Take 2 mLs (0.5  mg total) by nebulization in the morning and at bedtime. 06/18/23 06/17/24  [name], MD  Calcium Carbonate-Vitamin D 600-400 MG-UNIT tablet Take 1 tablet by mouth daily.     [provider]  famotidine (PEPCID) 20 MG tablet Take 1 tablet (20 mg total) by mouth 2 (two) times daily. 01/01/23 01/01/24  Erasmo Downer, MD  levothyroxine (SYNTHROID) 25 MCG tablet Take 1 tablet (25 mcg total) by mouth daily. 06/12/23   Erasmo Downer, MD  loratadine (CLARITIN) 10 MG tablet Take 1 tablet (10 mg total) by mouth daily. 04/08/23 04/07/24  [name], MD  metFORMIN (GLUCOPHAGE) 500 MG tablet Take 1 tablet (500 mg total) by mouth 2 (two) times daily with a meal. 03/14/23   Bacigalupo, Marzella Schlein, MD  omeprazole (PRILOSEC) 20 MG capsule Take 1 capsule (20 mg total) by mouth daily. 06/05/23   Jacky Kindle, FNP  propranolol (INDERAL) 20 MG tablet Take 1 tablet (20 mg total) by mouth 2 (two) times daily. 04/22/23 10/19/23  Erasmo Downer, MD  sertraline (ZOLOFT) 25 MG tablet Take 1 tablet (25 mg total) by mouth daily. 04/19/23   Bacigalupo, Marzella Schlein, MD      VITAL SIGNS:  Blood pressure (!) 141/72, pulse 89, temperature 98.3 F (36.8 C), temperature source Oral, resp. rate (!) 31, height 4\' 3"  (1.295 m), weight 46 kg, SpO2 100%.  PHYSICAL EXAMINATION:  Physical Exam  GENERAL:  82 y.o.-year-old patient lying in the bed with no acute distress.  EYES: Pupils equal, round, reactive to light and accommodation. No scleral icterus. Extraocular muscles intact.  HEENT: Head atraumatic, normocephalic. Oropharynx and nasopharynx clear.  NECK:  Supple, no jugular venous distention. No thyroid enlargement, no tenderness.  LUNGS: Diminished bibasal breath sounds with bibasal crackles.  No use of accessory muscles of respiration.  CARDIOVASCULAR: Regular rate and rhythm, S1, S2 normal. No murmurs, rubs, or gallops.  ABDOMEN: Soft, nondistended, nontender. Bowel sounds present. No organomegaly  or mass.  EXTREMITIES: No pedal edema, cyanosis, or clubbing.  NEUROLOGIC: Cranial nerves II through XII are intact. Muscle strength 5/5 in all extremities. Sensation intact. Gait not checked.  PSYCHIATRIC: The patient is alert and oriented x 3.  Normal affect and good eye contact. SKIN: No obvious rash, lesion, or ulcer.   LABORATORY PANEL:   CBC Recent Labs  Lab 06/19/23 1918  WBC 13.5*  HGB 8.8*  HCT 27.6*  PLT 488*   ------------------------------------------------------------------------------------------------------------------  Chemistries  Recent Labs  Lab 06/19/23 1918  NA 127*  K 5.2*  CL 97*  CO2 20*  GLUCOSE 182*  BUN 29*  CREATININE 1.20*  CALCIUM 9.6  AST 89*  ALT 53*  ALKPHOS 96  BILITOT 0.5   ------------------------------------------------------------------------------------------------------------------  Cardiac Enzymes No results for input(s): "TROPONINI" in the last 168 hours. ------------------------------------------------------------------------------------------------------------------  RADIOLOGY:  DG Chest Port 1 View  Result Date: 06/19/2023 CLINICAL DATA:  Shortness of breath, weakness, and cough EXAM: PORTABLE CHEST 1 VIEW COMPARISON:  Chest radiograph dated 08/27/2022  FINDINGS: Normal lung volumes. Bilateral peripheral reticulations with diffusely increased patchy opacities, left-greater-than-right. No pleural effusion or pneumothorax. Mildly enlarged cardiomediastinal silhouette. no acute osseous abnormality. IMPRESSION: 1. Bilateral peripheral reticulations with diffusely increased patchy opacities, left-greater-than-right, which may represent atypical infection superimposed on chronic interstitial lung disease. 2. Mild cardiomegaly. Electronically Signed   By: Agustin Cree M.D.   On: 06/19/2023 19:43      IMPRESSION AND PLAN:  Assessment and Plan: * Sepsis due to pneumonia [facility]) - The patient will be admitted to a medical telemetry  bed. - The patient has subsequent acute respiratory failure with hypoxia.  She meets severe sepsis criteria given her AKI, hypoxia and elevated lactic acid. - Will continue antibiotic therapy with IV Rocephin and Zithromax. - Mucolytic therapy be provided as well as duo nebs q.i.d. and q.4 hours p.r.n. - We will follow blood cultures. - She will be hydrated with IV lactated ringer after given IV NS in ER.   Elevated troponin I level - This is likely due to demand ischemia from her sepsis and pneumonia. - Differential diagnosis would include non-STEMI though she is not having chest pain and her troponin is coming down which makes it unlikely - We will follow serial troponin I's.   Hypothyroidism - We will continue Synthroid.  Type 2 diabetes mellitus without complications (HCC) - The patient will be placed on supplemental coverage with NovoLog. - We will hold off metformin.  Essential hypertension - We will continue antihypertensive therapy.  Interstitial lung disease (HCC) - Working steroid therapy and Pulmicort nebulizer.  GERD without esophagitis - We will continue PPI therapy.  Dyslipidemia - We will continue Synthroid.  Depression - We will continue Zoloft.   DVT prophylaxis: Lovenox.  Advanced Care Planning:  Code Status: full code.  Family Communication:  The plan of care was discussed in details with the patient (and family). I answered all questions. The patient agreed to proceed with the above mentioned plan. Further management will depend upon hospital course. Disposition Plan: Back to previous home environment Consults called: none.  All the records are reviewed and case discussed with ED provider.  Status is: Inpatient  At the time of the admission, it appears that the appropriate admission status for this patient is inpatient.  This is judged to be reasonable and necessary in order to provide the required intensity of service to ensure the patient's safety  given the presenting symptoms, physical exam findings and initial radiographic and laboratory data in the context of comorbid conditions.  The patient requires inpatient status due to high intensity of service, high risk of further deterioration and high frequency of surveillance required.  I certify that at the time of admission, it is my clinical judgment that the patient will require inpatient hospital care extending more than 2 midnights.                            Dispo: The patient is from: Home              Anticipated d/c is to: Home              Patient currently is not medically stable to d/c.              Difficult to place patient: No  Hannah Beat M.D on 06/19/2023 at 10:18 PM  [facility]   From 7 PM-7 AM, contact night-coverage [email or web address]  CC: Primary care physician; Beryle Flock,  Marzella Schlein, MD

## 2023-06-19 NOTE — ED Notes (Signed)
Pt placed on 3liters oxygen and taken to room 17.

## 2023-06-19 NOTE — Consult Note (Signed)
ED Pharmacy Antibiotic Sign Off An antibiotic consult was received from an ED provider for Vancomycin per pharmacy dosing for sepsis. A chart review was completed to assess appropriateness.   The following one time order(s) were placed:  Vancomycin 1g IV   Further antibiotic and/or antibiotic pharmacy consults should be ordered by the admitting provider if indicated.   Thank you for allowing pharmacy to be a part of this patient's care.   Bettey Costa, [facility]  Clinical Pharmacist 06/19/23 6:47 PM

## 2023-06-19 NOTE — Assessment & Plan Note (Addendum)
Off medications at this time.

## 2023-06-20 ENCOUNTER — Inpatient Hospital Stay: Payer: Medicaid Other

## 2023-06-20 ENCOUNTER — Telehealth: Payer: Self-pay | Admitting: Student in an Organized Health Care Education/Training Program

## 2023-06-20 DIAGNOSIS — J9621 Acute and chronic respiratory failure with hypoxia: Secondary | ICD-10-CM

## 2023-06-20 DIAGNOSIS — A419 Sepsis, unspecified organism: Secondary | ICD-10-CM | POA: Diagnosis not present

## 2023-06-20 DIAGNOSIS — J849 Interstitial pulmonary disease, unspecified: Secondary | ICD-10-CM | POA: Diagnosis not present

## 2023-06-20 DIAGNOSIS — E871 Hypo-osmolality and hyponatremia: Secondary | ICD-10-CM

## 2023-06-20 DIAGNOSIS — J189 Pneumonia, unspecified organism: Secondary | ICD-10-CM | POA: Diagnosis not present

## 2023-06-20 LAB — PROTIME-INR
INR: 1.1 (ref 0.8–1.2)
Prothrombin Time: 14.8 s (ref 11.4–15.2)

## 2023-06-20 LAB — CBC WITH DIFFERENTIAL/PLATELET
Abs Immature Granulocytes: 0.09 10*3/uL — ABNORMAL HIGH (ref 0.00–0.07)
Basophils Absolute: 0 10*3/uL (ref 0.0–0.1)
Basophils Relative: 0 %
Eosinophils Absolute: 0.1 10*3/uL (ref 0.0–0.5)
Eosinophils Relative: 0 %
HCT: 24.1 % — ABNORMAL LOW (ref 36.0–46.0)
Hemoglobin: 7.5 g/dL — ABNORMAL LOW (ref 12.0–15.0)
Immature Granulocytes: 1 %
Lymphocytes Relative: 10 %
Lymphs Abs: 1.5 10*3/uL (ref 0.7–4.0)
MCH: 24.1 pg — ABNORMAL LOW (ref 26.0–34.0)
MCHC: 31.1 g/dL (ref 30.0–36.0)
MCV: 77.5 fL — ABNORMAL LOW (ref 80.0–100.0)
Monocytes Absolute: 1.5 10*3/uL — ABNORMAL HIGH (ref 0.1–1.0)
Monocytes Relative: 10 %
Neutro Abs: 11.6 10*3/uL — ABNORMAL HIGH (ref 1.7–7.7)
Neutrophils Relative %: 79 %
Platelets: 431 10*3/uL — ABNORMAL HIGH (ref 150–400)
RBC: 3.11 MIL/uL — ABNORMAL LOW (ref 3.87–5.11)
RDW: 15.3 % (ref 11.5–15.5)
WBC: 14.8 10*3/uL — ABNORMAL HIGH (ref 4.0–10.5)
nRBC: 0 % (ref 0.0–0.2)

## 2023-06-20 LAB — TECHNOLOGIST SMEAR REVIEW: Plt Morphology: NORMAL

## 2023-06-20 LAB — RESPIRATORY PANEL BY PCR

## 2023-06-20 LAB — BASIC METABOLIC PANEL
Anion gap: 5 (ref 5–15)
Anion gap: 10 (ref 5–15)
BUN: 22 mg/dL (ref 8–23)
BUN: 18 mg/dL (ref 8–23)
CO2: 21 mmol/L — ABNORMAL LOW (ref 22–32)
CO2: 19 mmol/L — ABNORMAL LOW (ref 22–32)
Calcium: 8.3 mg/dL — ABNORMAL LOW (ref 8.9–10.3)
Calcium: 8.3 mg/dL — ABNORMAL LOW (ref 8.9–10.3)
Chloride: 102 mmol/L (ref 98–111)
Chloride: 96 mmol/L — ABNORMAL LOW (ref 98–111)
Creatinine, Ser: 0.87 mg/dL (ref 0.44–1.00)
Creatinine, Ser: 0.93 mg/dL (ref 0.44–1.00)
GFR, Estimated: >60 mL/min (ref >60)
GFR, Estimated: >60 mL/min (ref >60)
Glucose, Bld: 156 mg/dL — ABNORMAL HIGH (ref 70–99)
Glucose, Bld: 272 mg/dL — ABNORMAL HIGH (ref 70–99)
Potassium: 3.8 mmol/L (ref 3.5–5.1)
Potassium: 4.2 mmol/L (ref 3.5–5.1)
Sodium: 128 mmol/L — ABNORMAL LOW (ref 135–145)
Sodium: 125 mmol/L — ABNORMAL LOW (ref 135–145)

## 2023-06-20 LAB — HEPATIC FUNCTION PANEL
ALT: 117 U/L — ABNORMAL HIGH (ref 0–44)
AST: 144 U/L — ABNORMAL HIGH (ref 15–41)
Albumin: 2.7 g/dL — ABNORMAL LOW (ref 3.5–5.0)
Alkaline Phosphatase: 167 U/L — ABNORMAL HIGH (ref 38–126)
Bilirubin, Direct: 0.3 mg/dL — ABNORMAL HIGH (ref 0.0–0.2)
Indirect Bilirubin: 0.6 mg/dL (ref 0.3–0.9)
Total Bilirubin: 0.9 mg/dL (ref <1.2)
Total Protein: 6.6 g/dL (ref 6.5–8.1)

## 2023-06-20 LAB — CBC
HCT: 23.6 % — ABNORMAL LOW (ref 36.0–46.0)
HCT: 21.1 % — ABNORMAL LOW (ref 36.0–46.0)
Hemoglobin: 7.7 g/dL — ABNORMAL LOW (ref 12.0–15.0)
Hemoglobin: 6.9 g/dL — ABNORMAL LOW (ref 12.0–15.0)
MCH: 24.9 pg — ABNORMAL LOW (ref 26.0–34.0)
MCH: 24.8 pg — ABNORMAL LOW (ref 26.0–34.0)
MCHC: 32.6 g/dL (ref 30.0–36.0)
MCHC: 32.7 g/dL (ref 30.0–36.0)
MCV: 76.4 fL — ABNORMAL LOW (ref 80.0–100.0)
MCV: 75.9 fL — ABNORMAL LOW (ref 80.0–100.0)
Platelets: 368 10*3/uL (ref 150–400)
Platelets: 334 10*3/uL (ref 150–400)
RBC: 3.09 MIL/uL — ABNORMAL LOW (ref 3.87–5.11)
RBC: 2.78 MIL/uL — ABNORMAL LOW (ref 3.87–5.11)
RDW: 15.1 % (ref 11.5–15.5)
RDW: 15.2 % (ref 11.5–15.5)
WBC: 12.4 10*3/uL — ABNORMAL HIGH (ref 4.0–10.5)
WBC: 13.6 10*3/uL — ABNORMAL HIGH (ref 4.0–10.5)
nRBC: 0.2 % (ref 0.0–0.2)
nRBC: 0 % (ref 0.0–0.2)

## 2023-06-20 LAB — STREP PNEUMONIAE URINARY ANTIGEN: Strep Pneumo Urinary Antigen: NEGATIVE

## 2023-06-20 LAB — OSMOLALITY, URINE: Osmolality, Ur: 431 mosm/kg (ref 300–900)

## 2023-06-20 LAB — IRON AND TIBC
Iron: 10 ug/dL — ABNORMAL LOW (ref 28–170)
Saturation Ratios: 3 % — ABNORMAL LOW (ref 10.4–31.8)
TIBC: 322 ug/dL (ref 250–450)
UIBC: 312 ug/dL

## 2023-06-20 LAB — BLOOD GAS, VENOUS
Acid-Base Excess: 1.9 mmol/L (ref 0.0–2.0)
Bicarbonate: 26.6 mmol/L (ref 20.0–28.0)
O2 Saturation: 99.9 %
Patient temperature: 37
pCO2, Ven: 41 mm[Hg] — ABNORMAL LOW (ref 44–60)
pH, Ven: 7.42 (ref 7.25–7.43)
pO2, Ven: 121 mm[Hg] — ABNORMAL HIGH (ref 32–45)

## 2023-06-20 LAB — LACTIC ACID, PLASMA
Lactic Acid, Venous: 1.6 mmol/L (ref 0.5–1.9)
Lactic Acid, Venous: 3.2 mmol/L (ref 0.5–1.9)
Lactic Acid, Venous: 1.5 mmol/L (ref 0.5–1.9)

## 2023-06-20 LAB — BLOOD GAS, ARTERIAL
Acid-base deficit: 10.3 mmol/L — ABNORMAL HIGH (ref 0.0–2.0)
Bicarbonate: 14.8 mmol/L — ABNORMAL LOW (ref 20.0–28.0)
O2 Content: 15 L/min
O2 Saturation: 92.7 %
Patient temperature: 37
pCO2 arterial: 30 mm[Hg] — ABNORMAL LOW (ref 32–48)
pH, Arterial: 7.3 — ABNORMAL LOW (ref 7.35–7.45)
pO2, Arterial: 69 mm[Hg] — ABNORMAL LOW (ref 83–108)

## 2023-06-20 LAB — URINALYSIS, W/ REFLEX TO CULTURE (INFECTION SUSPECTED)
Bacteria, UA: NONE SEEN
Bilirubin Urine: NEGATIVE
Glucose, UA: NEGATIVE mg/dL
Hgb urine dipstick: NEGATIVE
Ketones, ur: 5 mg/dL — AB
Leukocytes,Ua: NEGATIVE
Nitrite: NEGATIVE
Protein, ur: NEGATIVE mg/dL
Specific Gravity, Urine: 1.013 (ref 1.005–1.030)
pH: 5 (ref 5.0–8.0)

## 2023-06-20 LAB — CBG MONITORING, ED
Glucose-Capillary: 138 mg/dL — ABNORMAL HIGH (ref 70–99)
Glucose-Capillary: 110 mg/dL — ABNORMAL HIGH (ref 70–99)
Glucose-Capillary: 251 mg/dL — ABNORMAL HIGH (ref 70–99)
Glucose-Capillary: 226 mg/dL — ABNORMAL HIGH (ref 70–99)

## 2023-06-20 LAB — SODIUM, URINE, RANDOM: Sodium, Ur: 64 mmol/L

## 2023-06-20 LAB — FERRITIN: Ferritin: 25 ng/mL (ref 11–307)

## 2023-06-20 LAB — MRSA NEXT GEN BY PCR, NASAL: MRSA by PCR Next Gen: NOT DETECTED

## 2023-06-20 LAB — CORTISOL-AM, BLOOD: Cortisol - AM: 43.3 ug/dL — ABNORMAL HIGH (ref 6.7–22.6)

## 2023-06-20 LAB — BETA-HYDROXYBUTYRIC ACID: Beta-Hydroxybutyric Acid: 0.4 mmol/L — ABNORMAL HIGH (ref 0.05–0.27)

## 2023-06-20 LAB — OSMOLALITY: Osmolality: 277 mosm/kg (ref 275–295)

## 2023-06-20 LAB — BRAIN NATRIURETIC PEPTIDE: B Natriuretic Peptide: 1312.1 pg/mL — ABNORMAL HIGH (ref 0.0–100.0)

## 2023-06-20 LAB — PROCALCITONIN: Procalcitonin: 0.32 ng/mL

## 2023-06-20 MED ORDER — PIPERACILLIN-TAZOBACTAM 3.375 G IVPB
3.3750 g | Freq: Three times a day (TID) | INTRAVENOUS | Status: DC
  Administered 2023-06-20: 3.375 g via INTRAVENOUS
  Filled 2023-06-20: qty 50

## 2023-06-20 MED ORDER — SODIUM BICARBONATE 8.4 % IV SOLN
100.0000 meq | Freq: Once | INTRAVENOUS | Status: AC
  Administered 2023-06-20: 100 meq via INTRAVENOUS
  Filled 2023-06-20: qty 50

## 2023-06-20 MED ORDER — INSULIN ASPART 100 UNIT/ML IJ SOLN
0.0000 [IU] | Freq: Three times a day (TID) | INTRAMUSCULAR | Status: DC
  Administered 2023-06-20: 5 [IU] via SUBCUTANEOUS
  Filled 2023-06-20: qty 1

## 2023-06-20 MED ORDER — SODIUM CHLORIDE 0.9 % IV SOLN
500.0000 mg | INTRAVENOUS | Status: DC
  Administered 2023-06-20 – 2023-06-26 (×7): 500 mg via INTRAVENOUS
  Filled 2023-06-20 (×8): qty 5

## 2023-06-20 MED ORDER — SODIUM CHLORIDE 0.9 % IV SOLN: 2.0000 g | INTRAVENOUS | Status: DC

## 2023-06-20 MED ORDER — PIPERACILLIN-TAZOBACTAM 3.375 G IVPB
3.3750 g | Freq: Three times a day (TID) | INTRAVENOUS | Status: DC
  Administered 2023-06-20 – 2023-06-24 (×12): 3.375 g via INTRAVENOUS
  Filled 2023-06-20 (×12): qty 50

## 2023-06-20 MED ORDER — METHYLPREDNISOLONE SODIUM SUCC 125 MG IJ SOLR
120.0000 mg | Freq: Every day | INTRAMUSCULAR | Status: DC
  Administered 2023-06-20 – 2023-06-21 (×2): 120 mg via INTRAVENOUS
  Filled 2023-06-20 (×2): qty 2

## 2023-06-20 MED ORDER — INSULIN ASPART 100 UNIT/ML IJ SOLN
0.0000 [IU] | Freq: Three times a day (TID) | INTRAMUSCULAR | Status: DC
  Administered 2023-06-21: 2 [IU] via SUBCUTANEOUS
  Administered 2023-06-21: 8 [IU] via SUBCUTANEOUS
  Administered 2023-06-21 – 2023-06-22 (×3): 3 [IU] via SUBCUTANEOUS
  Administered 2023-06-22: 5 [IU] via SUBCUTANEOUS
  Administered 2023-06-22: 8 [IU] via SUBCUTANEOUS
  Administered 2023-06-22: 5 [IU] via SUBCUTANEOUS
  Administered 2023-06-23 (×4): 3 [IU] via SUBCUTANEOUS
  Administered 2023-06-24: 2 [IU] via SUBCUTANEOUS
  Administered 2023-06-24 – 2023-06-25 (×4): 3 [IU] via SUBCUTANEOUS
  Administered 2023-06-25 (×2): 5 [IU] via SUBCUTANEOUS
  Administered 2023-06-26: 2 [IU] via SUBCUTANEOUS
  Administered 2023-06-26: 3 [IU] via SUBCUTANEOUS
  Administered 2023-06-26: 8 [IU] via SUBCUTANEOUS
  Administered 2023-06-27 (×3): 3 [IU] via SUBCUTANEOUS
  Administered 2023-06-27: 5 [IU] via SUBCUTANEOUS
  Administered 2023-06-28 – 2023-06-30 (×9): 3 [IU] via SUBCUTANEOUS
  Administered 2023-06-30: 5 [IU] via SUBCUTANEOUS
  Administered 2023-06-30 (×2): 3 [IU] via SUBCUTANEOUS
  Filled 2023-06-20 (×39): qty 1

## 2023-06-20 MED ORDER — IOHEXOL 350 MG/ML SOLN
100.0000 mL | Freq: Once | INTRAVENOUS | Status: AC | PRN
  Administered 2023-06-20: 100 mL via INTRAVENOUS

## 2023-06-20 MED ORDER — FUROSEMIDE 10 MG/ML IJ SOLN
20.0000 mg | Freq: Once | INTRAMUSCULAR | Status: AC
  Administered 2023-06-20: 20 mg via INTRAVENOUS
  Filled 2023-06-20: qty 4

## 2023-06-20 MED ORDER — INSULIN ASPART 100 UNIT/ML IJ SOLN
0.0000 [IU] | Freq: Every day | INTRAMUSCULAR | Status: DC
  Administered 2023-06-20: 2 [IU] via SUBCUTANEOUS
  Filled 2023-06-20: qty 1

## 2023-06-20 NOTE — Consult Note (Signed)
NAME:  Erin Wagner, MRN:  [id number], DOB:  Oct 14, 1940, LOS: 1 ADMISSION DATE:  06/19/2023, CONSULTATION DATE:  06/20/2023 REFERRING MD:  Shonna Chock, MD, CHIEF COMPLAINT:  Respiratory Failure   History of Present Illness:   82 year old female with history of ILD presenting to the hospital with increased shortness of breath and cough.  Patient presents today with symptoms of cough and weakness. Symptoms worsened around Friday of last week. Prior to that, she and her son report that she was in her usual state of health. They deny sore throat, fever, chills, night sweats, or weight changes. They report no sick contacts, and no body in the family is sick with any viral illness. She denies any chest pain or chest tightness.  Patient is followed by me in clinic for ILD. I last saw her on 04/08/2023. 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 Opted out of surgical biopsy given age following shared decision making discussion with patient and family. We did empirically treate with steroids which showed significant improvement in symptoms and radiographic improvement on repeat chest CT in September of 2024 following discontinuation of steroid taper.  Patient is originally from [place] where she was born and lived up until 6 years ago.  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  Patient reports using wood sticks for fuel for around 20 years of her life, after which she switched to coal briefly and then kerosene/diesel. She moved in with her son 6 years ago in [place].  They have lived in the same house (new build).  There is no basement, no water damage, and no mold in the house. No sick contacts reported   Pertinent  Medical History   Patient's past medical history includes breast cancer status  post bilateral mastectomy and chemoradiation. She also has a history of depression and anxiety  Objective   Blood pressure (!) 147/63, pulse 89, temperature 98.2 F (36.8 C), temperature source Oral, resp. rate (!) 33, height 4\' 3"  (1.295 m), weight 46 kg, SpO2 91%.        Intake/Output Summary (Last 24 hours) at 06/20/2023 8295 Last data filed at 06/19/2023 2242 Gross per 24 hour  Intake 250.18 ml  Output --  Net 250.18 ml   Filed Weights   06/19/23 1803  Weight: 46 kg    Examination: Physical Exam Constitutional:      General: She is not in acute distress.    Appearance: She is ill-appearing.  Cardiovascular:     Rate and Rhythm: Normal rate and regular rhythm.     Pulses: Normal pulses.     Heart sounds: Normal heart sounds.  Pulmonary:     Breath sounds: Rhonchi and rales present. No wheezing.  Abdominal:     Palpations: Abdomen is soft.  Musculoskeletal:     Right lower leg: No edema.     Left lower leg: No edema.  Neurological:     General: No focal deficit present.     Mental Status: She is alert and oriented to person, place, and time. Mental status is at baseline.      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 She presents with a few days history of increased shortness of breath and hypoxia. I have ordered a repeat chest CT today to re-evaluate her pulmonary parenchyma which on my review shows her underlying interstitial lung disease with super imposed ground glass opacities.  It is my impression that these imaging findings most likely represent an infectious process though it is difficult to appropriately rule out a flare of her ILD given we've not had tissue confirmation of ILD process. Given this, would recommend ruling out infectious etiologies (full viral panel,  respiratory cultures, beta-d-glucan, aspergillus, cryptococcal antigen), continued antibiotics, and continued steroids for now. Would initiate chest physiotherapy with incentive spirometer and flutter device, with consideration for hypertonic saline tomorrow.  -O2 supplementation, goal SpO2 > 92% -continue steroids -check viral panel -check beta-D-glucan -check aspergillus galactomannan -check cryptococcal antigen -respiratory cultures -continue CAP coverage (Ceftriaxone and Azithromycin) -we will continue to follow  [name], MD [facility] 06/20/2023 4:26 PM    Labs   CBC: Recent Labs  Lab 06/19/23 1918 06/20/23 0510  WBC 13.5* 12.4*  HGB 8.8* 7.7*  HCT 27.6* 23.6*  MCV 75.2* 76.4*  PLT 488* 368    Basic Metabolic Panel: Recent Labs  Lab 06/19/23 1918 06/20/23 0510  NA 127* 128*  K 5.2* 3.8  CL 97* 102  CO2 20* 21*  GLUCOSE 182* 156*  BUN 29* 22  CREATININE 1.20* 0.87  CALCIUM 9.6 8.3*   GFR: Estimated Creatinine Clearance: 25.8 mL/min (by C-G formula based on SCr of 0.87 mg/dL). Recent Labs  Lab 06/19/23 1918 06/19/23 2035 06/20/23 0510  PROCALCITON  --   --  0.32  WBC 13.5*  --  12.4*  LATICACIDVEN 4.7* 3.2*  --     Liver Function Tests: Recent Labs  Lab 06/19/23 1918  AST 89*  ALT 53*  ALKPHOS 96  BILITOT 0.5  PROT 7.5  ALBUMIN 3.0*   No results for input(s): "LIPASE", "AMYLASE" in the last 168 hours. No results for input(s): "AMMONIA" in the last 168 hours.  ABG No results found for: "PHART", "PCO2ART", "PO2ART", "HCO3", "TCO2", "ACIDBASEDEF", "O2SAT"   Coagulation Profile: Recent Labs  Lab 06/19/23 1918 06/20/23 0510  INR 1.1 1.1    Cardiac Enzymes: No results for input(s): "CKTOTAL", "CKMB", "CKMBINDEX", "TROPONINI" in the last 168 hours.  HbA1C: Hgb A1c MFr Bld  Date/Time Value Ref Range Status  02/18/2023 03:43 PM 6.1 (H) 4.8 - 5.6 % Final    Comment:             Prediabetes: 5.7 - 6.4           Diabetes: >6.4          Glycemic control for adults with diabetes: <7.0   08/27/2022 04:35 PM 6.6 (H) 4.8 - 5.6 % Final    Comment:             Prediabetes: 5.7 - 6.4          Diabetes: >6.4          Glycemic control for adults with diabetes: <7.0     CBG: Recent Labs  Lab 06/20/23 0818  GLUCAP 138*     Past Medical History:  She,  has a past medical history of Breast cancer (HCC), Cataract, Diabetes mellitus without complication (HCC), Hyperlipidemia, Hypertension, and Thyroid disease.   Surgical History:   Past Surgical History:  Procedure Laterality Date   BREAST SURGERY     EYE SURGERY  Social History:   reports that she has never smoked. She has never used smokeless tobacco. She reports that she does not drink alcohol and does not use drugs.   Family History:  Her family history includes Healthy in her brother, daughter, daughter, daughter, sister, and sister.   Allergies No Known Allergies   Home Medications  Prior to Admission medications   Medication Sig Start Date End Date Taking? Authorizing Provider  alendronate (FOSAMAX) 70 MG tablet Take 1 tablet (70 mg total) by mouth every 7 (seven) days. Take with a full glass of water on an empty stomach. 02/05/23   Simmons-Robinson, Makiera, MD  atorvastatin (LIPITOR) 20 MG tablet Take 1 tablet (20 mg total) by mouth daily. 08/27/22   Erasmo Downer, MD  budesonide (PULMICORT) 0.5 MG/2ML nebulizer solution Take 2 mLs (0.5 mg total) by nebulization in the morning and at bedtime. 06/18/23 06/17/24  [name], MD  Calcium Carbonate-Vitamin D 600-400 MG-UNIT tablet Take 1 tablet by mouth daily.     [provider]  famotidine (PEPCID) 20 MG tablet Take 1 tablet (20 mg total) by mouth 2 (two) times daily. 01/01/23 01/01/24  Erasmo Downer, MD  levothyroxine (SYNTHROID) 25 MCG tablet Take 1 tablet (25 mcg total) by mouth daily. 06/12/23   Erasmo Downer, MD  loratadine (CLARITIN) 10 MG  tablet Take 1 tablet (10 mg total) by mouth daily. 04/08/23 04/07/24  [name], MD  metFORMIN (GLUCOPHAGE) 500 MG tablet Take 1 tablet (500 mg total) by mouth 2 (two) times daily with a meal. 03/14/23   Bacigalupo, Marzella Schlein, MD  omeprazole (PRILOSEC) 20 MG capsule Take 1 capsule (20 mg total) by mouth daily. 06/05/23   Jacky Kindle, FNP  propranolol (INDERAL) 20 MG tablet Take 1 tablet (20 mg total) by mouth 2 (two) times daily. 04/22/23 10/19/23  Erasmo Downer, MD  sertraline (ZOLOFT) 25 MG tablet Take 1 tablet (25 mg total) by mouth daily. 04/19/23   Erasmo Downer, MD      I spent 64 minutes caring for this patient today, including preparing to see the patient, obtaining a medical history , reviewing a separately obtained history, performing a medically appropriate examination and/or evaluation, counseling and educating the patient/family/caregiver, ordering medications, tests, or procedures, documenting clinical information in the electronic health record, and independently interpreting results (not separately reported/billed) and communicating results to the patient/family/caregiver

## 2023-06-20 NOTE — Progress Notes (Addendum)
PROGRESS NOTE    Erin Wagner  UYQ:[id number] DOB: 11/11/1940 DOA: 06/19/2023 PCP: Erasmo Downer, MD  Outpatient Specialists: pulm    Brief Narrative:  From admission h and p  Erin Wagner is a 82 y.o. female with medical history significant for breast cancer in remission on tamoxifen, hypertension, dyslipidemia, interstitial lung disease and hypothyroidism, who presented to the emergency room with acute onset of cough and dyspnea last 7 days.  She has been having increased work of breathing productive cough after having taking a recent steroid taper.  No fever or chills.  No nausea or vomiting or abdominal pain.  No excessive wheezing.  No chest pain or palpitations.  No dysuria, oliguria or hematuria or flank pain.  She admitted to generalized weakness.  No bleeding diathesis.   Assessment & Plan:   Principal Problem:   Sepsis due to pneumonia [facility]) Active Problems:   Hypothyroidism   Elevated troponin I level   Hyponatremia   Type 2 diabetes mellitus without complications (HCC)   Essential hypertension   History of breast cancer   Depression   Dyslipidemia   GERD without esophagitis   Interstitial lung disease (HCC)   Acute respiratory failure with hypoxia (HCC)   Elevated troponin  # ILD # CAP Hx of ILD with somewhat broad differential, here with one week worsening cough and dyspnea, cxr with signs superimposed pneumonia. Covid/flu/rsv neg - broaden abx to zosyn - f/u blood culture, add sputum culture, urine legionella and strep, mrsa swab, rvp - start methylpred, continue inhaled budesonide - pulm consult - swallow eval  # Acute hypoxic respiratory failure On 5 liters currently satting in the low 80s - broadening therapy as above - start high flow [place] - family confirms dnr/dni - vbg  # Sepsis, severe By tachycardia, tachypnea, lactate greater than 4. S/p fluid resuscitation - continue fluids and trend lactate  # Troponin elevation No  chest pain, troponin 104>91, inferomedial twis on EKG, suspect demand - monitor closely - maintain tele  # Hyponatremia Acute on chronic baseline appears to be low 130s likely siadh from chronic lung disease, may today have a degree of hypovolemia. Tsh wnl - continue fluids - f/u serum and urine osm, urine sodium, cortisol  # Microcytic anemia Family reports brown stool, no other bleeding. Hgb 7.7, was 10.4 a year ago - iron panel, smear - monitor closely, GI involvement if worsens inpatient or if clinical signs of bleeding  # Hypothyroid - cont home synthroid  # GAD - home sertraline  # T2DM - SSI while on steroids  # Debility - will need pt/ot consults when stabilized   DVT prophylaxis: lovenox Code Status: dnr/dni confirmed with family Family Communication: son updated at bedside  Level of care: Telemetry Medical Status is: Inpatient Remains inpatient appropriate because: severity of illness    Consultants:  pulm  Procedures: none  Antimicrobials:  See above    Subjective: Reports ongoing dyspnea and cough  Objective: Vitals:   06/20/23 0800 06/20/23 0815 06/20/23 0830 06/20/23 0833  BP:  (!) 159/97 (!) 125/101 (!) 125/101  Pulse: (!) 102 99 (!) 101 (!) 101  Resp: (!) 37 (!) 31 (!) 27   Temp:      TempSrc:      SpO2: 96% 95% (!) 87%   Weight:      Height:        Intake/Output Summary (Last 24 hours) at 06/20/2023 0859 Last data filed at 06/19/2023 2242 Gross per 24 hour  Intake 250.18 ml  Output --  Net 250.18 ml   Filed Weights   06/19/23 1803  Weight: 46 kg    Examination:  General exam: chronically ill appearing Respiratory system: diffuse rhonchi/rales Cardiovascular system: S1 & S2 heard, RRR.   Gastrointestinal system: Abdomen is nondistended, soft and nontender.   Central nervous system: Alert and oriented. No focal neurological deficits. Extremities: , warm, trace LE edema Skin: No rashes, lesions or ulcers Psychiatry:  Judgement and insight appear normal. Mood & affect appropriate.     Data Reviewed: I have personally reviewed following labs and imaging studies  CBC: Recent Labs  Lab 06/19/23 1918 06/20/23 0510  WBC 13.5* 12.4*  HGB 8.8* 7.7*  HCT 27.6* 23.6*  MCV 75.2* 76.4*  PLT 488* 368   Basic Metabolic Panel: Recent Labs  Lab 06/19/23 1918 06/20/23 0510  NA 127* 128*  K 5.2* 3.8  CL 97* 102  CO2 20* 21*  GLUCOSE 182* 156*  BUN 29* 22  CREATININE 1.20* 0.87  CALCIUM 9.6 8.3*   GFR: Estimated Creatinine Clearance: 25.8 mL/min (by C-G formula based on SCr of 0.87 mg/dL). Liver Function Tests: Recent Labs  Lab 06/19/23 1918  AST 89*  ALT 53*  ALKPHOS 96  BILITOT 0.5  PROT 7.5  ALBUMIN 3.0*   No results for input(s): "LIPASE", "AMYLASE" in the last 168 hours. No results for input(s): "AMMONIA" in the last 168 hours. Coagulation Profile: Recent Labs  Lab 06/19/23 1918 06/20/23 0510  INR 1.1 1.1   Cardiac Enzymes: No results for input(s): "CKTOTAL", "CKMB", "CKMBINDEX", "TROPONINI" in the last 168 hours. BNP (last 3 results) No results for input(s): "PROBNP" in the last 8760 hours. HbA1C: No results for input(s): "HGBA1C" in the last 72 hours. CBG: Recent Labs  Lab 06/20/23 0818  GLUCAP 138*   Lipid Profile: No results for input(s): "CHOL", "HDL", "LDLCALC", "TRIG", "CHOLHDL", "LDLDIRECT" in the last 72 hours. Thyroid Function Tests: Recent Labs    06/19/23 1918  TSH 2.165  FREET4 1.75*   Anemia Panel: No results for input(s): "VITAMINB12", "FOLATE", "FERRITIN", "TIBC", "IRON", "RETICCTPCT" in the last 72 hours. Urine analysis: No results found for: "COLORURINE", "APPEARANCEUR", "LABSPEC", "PHURINE", "GLUCOSEU", "HGBUR", "BILIRUBINUR", "KETONESUR", "PROTEINUR", "UROBILINOGEN", "NITRITE", "LEUKOCYTESUR" Sepsis Labs: @LABRCNTIP (procalcitonin:4,lacticidven:4)  ) Recent Results (from the past 240 hours)  Blood Culture (routine x 2)     Status: None  (Preliminary result)   Collection Time: 06/19/23  7:19 PM   Specimen: BLOOD  Result Value Ref Range Status   Specimen Description BLOOD BLOOD LEFT ARM  Final   Special Requests   Final    BOTTLES DRAWN AEROBIC AND ANAEROBIC Blood Culture adequate volume   Culture   Final    NO GROWTH < 12 HOURS Performed at [facility], [address] [place], [place] [address]    Report Status PENDING  Incomplete  Blood Culture (routine x 2)     Status: None (Preliminary result)   Collection Time: 06/19/23  7:19 PM   Specimen: BLOOD  Result Value Ref Range Status   Specimen Description BLOOD BLOOD RIGHT ARM  Final   Special Requests   Final    BOTTLES DRAWN AEROBIC AND ANAEROBIC Blood Culture adequate volume   Culture   Final    NO GROWTH < 12 HOURS Performed at [facility], [address] [place], [place] [address]    Report Status PENDING  Incomplete  Resp panel by RT-PCR (RSV, Flu A&B, Covid) Anterior Nasal Swab  Status: None   Collection Time: 06/19/23  8:03 PM   Specimen: Anterior Nasal Swab  Result Value Ref Range Status   SARS Coronavirus 2 by RT PCR NEGATIVE NEGATIVE Final    Comment: (NOTE) SARS-CoV-2 target nucleic acids are NOT DETECTED.  The SARS-CoV-2 RNA is generally detectable in upper respiratory specimens during the acute phase of infection. The lowest concentration of SARS-CoV-2 viral copies this assay can detect is 138 copies/mL. A negative result does not preclude SARS-Cov-2 infection and should not be used as the sole basis for treatment or other patient management decisions. A negative result may occur with  improper specimen collection/handling, submission of specimen other than nasopharyngeal swab, presence of viral mutation(s) within the areas targeted by this assay, and inadequate number of viral copies(<138 copies/mL). A negative result must be combined with clinical observations, patient history, and epidemiological information. The  expected result is Negative.  Fact Sheet for Patients:  BloggerCourse.com  Fact Sheet for Healthcare Providers:  SeriousBroker.it  This test is no t yet approved or cleared by the [place] FDA and  has been authorized for detection and/or diagnosis of SARS-CoV-2 by FDA under an Emergency Use Authorization (EUA). This EUA will remain  in effect (meaning this test can be used) for the duration of the COVID-19 declaration under Section 564(b)(1) of the Act, 21 U.S.C.section 360bbb-3(b)(1), unless the authorization is terminated  or revoked sooner.       Influenza A by PCR NEGATIVE NEGATIVE Final   Influenza B by PCR NEGATIVE NEGATIVE Final    Comment: (NOTE) The Xpert Xpress SARS-CoV-2/FLU/RSV plus assay is intended as an aid in the diagnosis of influenza from Nasopharyngeal swab specimens and should not be used as a sole basis for treatment. Nasal washings and aspirates are unacceptable for Xpert Xpress SARS-CoV-2/FLU/RSV testing.  Fact Sheet for Patients: BloggerCourse.com  Fact Sheet for Healthcare Providers: SeriousBroker.it  This test is not yet approved or cleared by the [place] FDA and has been authorized for detection and/or diagnosis of SARS-CoV-2 by FDA under an Emergency Use Authorization (EUA). This EUA will remain in effect (meaning this test can be used) for the duration of the COVID-19 declaration under Section 564(b)(1) of the Act, 21 U.S.C. section 360bbb-3(b)(1), unless the authorization is terminated or revoked.     Resp Syncytial Virus by PCR NEGATIVE NEGATIVE Final    Comment: (NOTE) Fact Sheet for Patients: BloggerCourse.com  Fact Sheet for Healthcare Providers: SeriousBroker.it  This test is not yet approved or cleared by the [place] FDA and has been authorized for detection and/or  diagnosis of SARS-CoV-2 by FDA under an Emergency Use Authorization (EUA). This EUA will remain in effect (meaning this test can be used) for the duration of the COVID-19 declaration under Section 564(b)(1) of the Act, 21 U.S.C. section 360bbb-3(b)(1), unless the authorization is terminated or revoked.  Performed at [facility], [address] [place], [place] [address]          Radiology Studies: DG Chest [place] 1 View Result Date: 06/19/2023 CLINICAL DATA:  Shortness of breath, weakness, and cough EXAM: PORTABLE CHEST 1 VIEW COMPARISON:  Chest radiograph dated 08/27/2022 FINDINGS: Normal lung volumes. Bilateral peripheral reticulations with diffusely increased patchy opacities, left-greater-than-right. No pleural effusion or pneumothorax. Mildly enlarged cardiomediastinal silhouette. no acute osseous abnormality. IMPRESSION: 1. Bilateral peripheral reticulations with diffusely increased patchy opacities, left-greater-than-right, which may represent atypical infection superimposed on chronic interstitial lung disease. 2. Mild cardiomegaly. Electronically Signed   By: Benjaman Kindler  Xu M.D.   On: 06/19/2023 19:43        Scheduled Meds:  atorvastatin  20 mg Oral Daily   budesonide  0.5 mg Nebulization BID   calcium-vitamin D  1 tablet Oral Daily   enoxaparin (LOVENOX) injection  30 mg Subcutaneous Q24H   famotidine  10 mg Oral BID   guaiFENesin  600 mg Oral BID   insulin aspart  0-5 Units Subcutaneous QHS   insulin aspart  0-9 Units Subcutaneous TID WC   ipratropium-albuterol  3 mL Nebulization QID   levothyroxine  25 mcg Oral Q0600   loratadine  10 mg Oral Daily   pantoprazole  40 mg Oral Daily   propranolol  20 mg Oral BID   sertraline  25 mg Oral Daily   Continuous Infusions:  azithromycin     cefTRIAXone (ROCEPHIN)  IV     lactated ringers 150 mL/hr (06/20/23 0732)     LOS: 1 day   CRITICAL CARE Performed by: Silvano Bilis   Total critical care time: 48  minutes  Critical care time was exclusive of separately billable procedures and treating other patients.  Critical care was necessary to treat or prevent imminent or life-threatening deterioration.  Critical care was time spent personally by me on the following activities: development of treatment plan with patient and/or surrogate as well as nursing, discussions with consultants, evaluation of patient's response to treatment, examination of patient, obtaining history from patient or surrogate, ordering and performing treatments and interventions, ordering and review of laboratory studies, ordering and review of radiographic studies, pulse oximetry and re-evaluation of patient's condition.    Silvano Bilis, MD [facility]   If 7PM-7AM, please contact night-coverage [email or web address] Password [email or web address] 06/20/2023, 8:59 AM

## 2023-06-20 NOTE — ED Notes (Signed)
Pt transported to CT with float RN

## 2023-06-20 NOTE — ED Notes (Signed)
This RN was made aware that CCMD was not called for pt. Called CCMD for monitoring at this time.

## 2023-06-20 NOTE — Assessment & Plan Note (Addendum)
Last sodium normal range

## 2023-06-20 NOTE — Evaluation (Signed)
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  Patient is followed by Pulmonology in Clinic for ILD. I last saw her on 04/08/2023. 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 Opted out of surgical biopsy given age following shared decision making discussion with patient and family. We did empirically treate with steroids which showed significant improvement in symptoms and radiographic improvement on repeat chest CT in September of 2024 following discontinuation of steroid taper.  Patient's past medical history includes breast cancer status post bilateral mastectomy and chemoradiation. She also has a history of depression and anxiety.  Chest CT: Biapical pleuroparenchymal scarring. Peripheral and  basilar predominant subpleural reticulation, traction  bronchiectasis/bronchiolectasis, ground-glass and honeycombing.  Findings are similar to 09/25/2022.   Pulmonary parenchymal pattern of fibrosis, as detailed above, similar to 09/25/2022.    Assessment / Plan / Recommendation  Clinical Impression   Pt seen for BSE this PM. Pt awake, verbal  and often quite talkative - talking frequently in other conversation w/ the Interpreter Services person, Hetal(#[id number]), in between eval/interpreting. Son arrived during session. Noted pt's Baseline RR was slightly increased w/ the increased talking and activity in the bed. Noted she was quite distracted and needed Min-Mod verbal cues to talk less to calm her breathing. Not English speaking. Pt has Baseline ILD and Pulmonary decline per chart. Pt and Son denied any changes in swallowing -- No difficulty swallowing at home. On [place] O2 support, afebrile. WBC elevated.  Pt appears to present w/ grossly functional oropharyngeal phase swallowing w/ No oropharyngeal phase dysphagia noted, No sensorimotor deficits noted. Pt consumed po trials this session and w/ Son w/ No overt, clinical s/s of aspiration during po trials when taking Rest Breaks to calm breathing b/t sips/bites.  Pt appears at reduced risk for aspiration when following general aspiration precautions including Rest Breaks during activities and oral intake. Pt does have challenging factors that could impact her oropharyngeal swallowing to include deconditioning/weakness, declined Pulmonary status/ILD baseline, increased WOB w/ any activity including talking, and advanced age. These factors can increase risk for aspiration, dysphagia as well as decreased oral intake overall.   During po trials, pt consumed all consistencies w/ no overt coughing, decline in vocal quality, or change in respiratory presentation during/post trials. O2 sats remained 93-95%. Oral phase appeared grossly [facility] w/ timely bolus management, mastication, and control of bolus propulsion for A-P transfer for swallowing. Oral clearing achieved w/ all trial consistencies -- moistened, soft foods given.  OM Exam appeared [facility] w/ no unilateral weakness noted. Speech Clear. Pt fed self w/ setup support.  Rest Breaks and NO TALKING  were instructed on and recommended during oral intake to  reduce RR and allow for timely swallowing/apnea moments. Pt followed instructions w/ cues. RR remained in mid-upper 20s the majority of the eval.   Recommend continue a Mech Soft consistency diet w/ well-Cut meats, moistened foods; Thin liquids -- carefully monitor straw use, and pt should Hold Cup when drinking. Recommend general aspiration precautions, w/ Reduced Distractions and Less Talking during oral intake. Recommend Rest Breaks during oral intake to calm breathing -- monitor WOB/SOB and stop eating/drinking if feeling too SOB/WOB. Pills 1 at a time w/ Water vs WHOLE in Puree for safer, easier swallowing.  Education given on Pills in Puree; food consistencies and easy to eat options; general aspiration precautions including Rest Breaks and less talking when eating/drinking to pt and Son. No further skilled ST services indicated currently. MD to reconsult if any new needs arise during admit. NSG updated, agreed.  Recommend Dietician f/u for support. SLP Visit Diagnosis: Dysphagia, unspecified (R13.10) (increased RR when talking and active)    Aspiration Risk   (reduced following general aspiration precautions)    Diet Recommendation   Thin;Dysphagia 3 (mechanical soft) (moist foods) = a Mech Soft consistency diet w/ well-Cut meats, moistened foods; Thin liquids -- carefully monitor straw use, and pt should Hold Cup when drinking. Recommend general aspiration precautions, w/ Reduced Distractions and Less Talking during oral intake. Recommend Rest Breaks during oral intake to calm breathing -- monitor WOB/SOB and stop eating/drinking if feeling too SOB/WOB.   Medication Administration: Whole meds with liquid (vs Whole in puree)    Other  Recommendations Recommended Consults:  (Dietician; follows w/ Pulmonology) Oral Care Recommendations: Oral care BID;Patient independent with oral care    Recommendations for follow up therapy are one component of a multi-disciplinary discharge planning process,  led by the attending physician.  Recommendations may be updated based on patient status, additional functional criteria and insurance authorization.  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  Patient is followed by Pulmonology in Clinic for ILD. I last saw her on 04/08/2023. 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 Opted out of surgical biopsy given age following shared decision making discussion with patient and family. We did empirically treate with steroids which showed significant improvement in symptoms and radiographic improvement on repeat chest CT in September of 2024 following discontinuation of steroid taper.  Patient's past medical history includes breast cancer status post bilateral mastectomy and chemoradiation. She also has a history of depression and anxiety.  Chest CT: Biapical pleuroparenchymal scarring. Peripheral and  basilar predominant subpleural reticulation, traction  bronchiectasis/bronchiolectasis, ground-glass and honeycombing.  Findings are similar to 09/25/2022.   Pulmonary parenchymal pattern of fibrosis, as detailed above, similar to 09/25/2022. Type of Study: Bedside  Swallow Evaluation Previous Swallow  Assessment: none Diet Prior to this Study: Dysphagia 3 (mechanical soft);Thin liquids (Level 0) Temperature Spikes Noted: No (wbc 14.8; Sodium 128) Respiratory Status: Nasal cannula (3-13L) History of Recent Intubation: No Behavior/Cognition: Alert;Cooperative;Pleasant mood;Distractible;Requires cueing Oral Cavity Assessment: Within Functional Limits Oral Care Completed by SLP: Recent completion by staff Oral Cavity - Dentition: Missing dentition Vision: Functional for self-feeding Self-Feeding Abilities: Able to feed self;Needs set up Patient Positioning: Upright in bed (needed positioning assistance) Baseline Vocal Quality: Normal Volitional Cough: Strong Volitional Swallow: Able to elicit    Oral/Motor/Sensory Function Overall Oral Motor/Sensory Function: Within functional limits   Ice Chips Ice chips: Not tested   Thin Liquid Thin Liquid: Within functional limits Presentation: Cup;Self Fed (~5-6 ozs) Other Comments: tea from home, water    Nectar Thick Nectar Thick Liquid: Not tested   Honey Thick Honey Thick Liquid: Not tested   Puree Puree: Within functional limits Presentation: Self Fed;Spoon (4 trials)   Solid     Solid: Within functional limits Presentation: Self Fed (2 trials - softened cookie w/ applesauce)         Jerilynn Som, MS, CCC-SLP Speech Language Pathologist Rehab Services; [facility] [phone] (ascom) Epifanio Labrador 06/20/2023,5:05 PM

## 2023-06-20 NOTE — ED Notes (Signed)
Per family pt hasn't gotten rest in two days and is requesting something to help her sleep. PRN trazodone to be given at this time

## 2023-06-20 NOTE — ED Notes (Signed)
This RN accompanied pt to CT monitored.

## 2023-06-20 NOTE — Consult Note (Signed)
Pharmacy Antibiotic Note  Erin Wagner is a 82 y.o. female admitted on 06/19/2023 with pneumonia.  Pharmacy has been consulted for Zosyn dosing.  Plan: Zosyn 3.375g IV q8h (4 hour infusion).  Height: 4\' 3"  (129.5 cm) Weight: 46 kg (101 lb 6.6 oz) IBW/kg (Calculated) : 24.8  Temp (24hrs), Avg:98.1 F (36.7 C), Min:97.8 F (36.6 C), Max:98.3 F (36.8 C)  Recent Labs  Lab 06/19/23 1918 06/19/23 2035 06/20/23 0510  WBC 13.5*  --  12.4*  CREATININE 1.20*  --  0.87  LATICACIDVEN 4.7* 3.2*  --     Estimated Creatinine Clearance: 25.8 mL/min (by C-G formula based on SCr of 0.87 mg/dL).    No Known Allergies  Antimicrobials this admission: Azithromycin x 1 12/11 Cefepime x 1 12/11 Vancomycin x 1 12/11 Zosyn 12/12>>  Dose adjustments this admission: N/A  Microbiology results: 12/11 BCx: ngtd 12/12 Sputum: ordered  12/12 MRSA PCR: ordered  Thank you for allowing pharmacy to be a part of this patient's care.  Barrie Folk, PharmD 06/20/2023 9:22 AM

## 2023-06-20 NOTE — ED Notes (Signed)
MD and RT at bedside. °

## 2023-06-20 NOTE — ED Notes (Signed)
This RN took pt over to CT and her oxygen has not gone above 90%, and when it does, it tends to drop down to 85% on high flow. RT was called and pt was given her medications due, including a neb tx. IV fluids, other than antibiotics were paused. Dr. Arville Care made aware of the above

## 2023-06-20 NOTE — ED Notes (Signed)
Dr. Ashok Pall notified that pt's oxygen sats have been mid 80's, and hav not improved much with AM neb treatments. Awaiting response.

## 2023-06-20 NOTE — ED Notes (Signed)
This RN walked into patient's room and patient was attempting to climb out of bed with family at bedside. Pt had ripped off monitors and oxygen and is diaphoretic, tachypniec and moaning inconsolably. Assisted patient back in bed and placed oxygen back on patient. Messaged MD at this time.

## 2023-06-20 NOTE — Progress Notes (Signed)
       CROSS COVER NOTE  NAME: PRACHI ASSINK MRN: [id number] DOB : [birth year]    Concern as stated by nurse / staff   Discussion with ICU NP most recent labs with worsening transaminitis and lactic acidosis     Pertinent findings on chart review: Elderly patient with ILD admitted with severe sepsis from pneumonia with acute hypoxic resp failure, metabolic acidosis with bicarb deficit and AKI.  Lacitc acidosis initially resolved after fluid resuscitation, however again increased to 3.2 as well as repeat blood gas reflecting worsening metabolic acidosis and transaminitis   Assessment and  Interventions   Assessment:    06/20/2023    8:35 PM 06/20/2023    8:00 PM 06/20/2023    7:45 PM  Vitals with BMI  Systolic 114 120   Diastolic 52 65   Pulse 106 95 95     Neuro - alert, slightly agitated with bed repositioning post scan.  Abdomen slight protruberant but soft. No pain with palpation Respirations - no labored . Easily desats  if oxygen is displaced CV - ST on monitor - non pitting lower extremity edema noted Plan: CTA abdomen ordered r/o bowel ischemia or other pathology to explain worsening as described above - significant only for perihepatic fluid presence BNP - 1312 - 20 mg lasix ordered last night and one dose this am-echo ordered - day team to follow up/involve cards if needed  Metabolic acidosis corrected with 2 amps bicarb Mag supplemented 2 grams IV. Potassium 40 meQ po   All interventions done in collaboration with ICU team Family updated at bedside    Donnie Mesa NP [facility] Cross Cover 7pm-7am - check amion for availability Pager [phone]

## 2023-06-20 NOTE — ED Notes (Signed)
Speech therapist at bedside.

## 2023-06-20 NOTE — Telephone Encounter (Signed)
[name] with [employer] sent in a message asking for see below [name]   We will need a narrative/ Provider mentioning need in an OV note with in the last 90days please

## 2023-06-21 ENCOUNTER — Inpatient Hospital Stay: Payer: Medicaid Other

## 2023-06-21 ENCOUNTER — Inpatient Hospital Stay (HOSPITAL_COMMUNITY)
Admit: 2023-06-21 | Discharge: 2023-06-21 | Disposition: A | Discharge status: Home / Self Care | Payer: Medicaid Other | Attending: Acute Care

## 2023-06-21 DIAGNOSIS — J849 Interstitial pulmonary disease, unspecified: Secondary | ICD-10-CM | POA: Diagnosis not present

## 2023-06-21 DIAGNOSIS — R0602 Shortness of breath: Secondary | ICD-10-CM

## 2023-06-21 DIAGNOSIS — I509 Heart failure, unspecified: Secondary | ICD-10-CM | POA: Diagnosis not present

## 2023-06-21 DIAGNOSIS — A419 Sepsis, unspecified organism: Secondary | ICD-10-CM | POA: Diagnosis not present

## 2023-06-21 DIAGNOSIS — J9601 Acute respiratory failure with hypoxia: Secondary | ICD-10-CM | POA: Diagnosis not present

## 2023-06-21 LAB — COMPREHENSIVE METABOLIC PANEL
ALT: 130 U/L — ABNORMAL HIGH (ref 0–44)
ALT: 139 U/L — ABNORMAL HIGH (ref 0–44)
AST: 168 U/L — ABNORMAL HIGH (ref 15–41)
AST: 173 U/L — ABNORMAL HIGH (ref 15–41)
Albumin: 2.3 g/dL — ABNORMAL LOW (ref 3.5–5.0)
Albumin: 2.6 g/dL — ABNORMAL LOW (ref 3.5–5.0)
Alkaline Phosphatase: 148 U/L — ABNORMAL HIGH (ref 38–126)
Alkaline Phosphatase: 150 U/L — ABNORMAL HIGH (ref 38–126)
Anion gap: 8 (ref 5–15)
Anion gap: 9 (ref 5–15)
BUN: 19 mg/dL (ref 8–23)
BUN: 18 mg/dL (ref 8–23)
CO2: 24 mmol/L (ref 22–32)
CO2: 27 mmol/L (ref 22–32)
Calcium: 7.9 mg/dL — ABNORMAL LOW (ref 8.9–10.3)
Calcium: 8.2 mg/dL — ABNORMAL LOW (ref 8.9–10.3)
Chloride: 100 mmol/L (ref 98–111)
Chloride: 100 mmol/L (ref 98–111)
Creatinine, Ser: 0.92 mg/dL (ref 0.44–1.00)
Creatinine, Ser: 0.98 mg/dL (ref 0.44–1.00)
GFR, Estimated: >60 mL/min (ref >60)
GFR, Estimated: 57 mL/min — ABNORMAL LOW (ref >60)
Glucose, Bld: 194 mg/dL — ABNORMAL HIGH (ref 70–99)
Glucose, Bld: 147 mg/dL — ABNORMAL HIGH (ref 70–99)
Potassium: 3.7 mmol/L (ref 3.5–5.1)
Potassium: 3.4 mmol/L — ABNORMAL LOW (ref 3.5–5.1)
Sodium: 132 mmol/L — ABNORMAL LOW (ref 135–145)
Sodium: 136 mmol/L (ref 135–145)
Total Bilirubin: 0.5 mg/dL (ref <1.2)
Total Bilirubin: 0.3 mg/dL (ref <1.2)
Total Protein: 5.7 g/dL — ABNORMAL LOW (ref 6.5–8.1)
Total Protein: 6.1 g/dL — ABNORMAL LOW (ref 6.5–8.1)

## 2023-06-21 LAB — CBC WITH DIFFERENTIAL/PLATELET
Abs Immature Granulocytes: 0.1 10*3/uL — ABNORMAL HIGH (ref 0.00–0.07)
Basophils Absolute: 0 10*3/uL (ref 0.0–0.1)
Basophils Relative: 0 %
Eosinophils Absolute: 0 10*3/uL (ref 0.0–0.5)
Eosinophils Relative: 0 %
HCT: 23.4 % — ABNORMAL LOW (ref 36.0–46.0)
Hemoglobin: 7.7 g/dL — ABNORMAL LOW (ref 12.0–15.0)
Immature Granulocytes: 1 %
Lymphocytes Relative: 7 %
Lymphs Abs: 1.2 10*3/uL (ref 0.7–4.0)
MCH: 24.8 pg — ABNORMAL LOW (ref 26.0–34.0)
MCHC: 32.9 g/dL (ref 30.0–36.0)
MCV: 75.5 fL — ABNORMAL LOW (ref 80.0–100.0)
Monocytes Absolute: 1.4 10*3/uL — ABNORMAL HIGH (ref 0.1–1.0)
Monocytes Relative: 9 %
Neutro Abs: 14 10*3/uL — ABNORMAL HIGH (ref 1.7–7.7)
Neutrophils Relative %: 83 %
Platelets: 406 10*3/uL — ABNORMAL HIGH (ref 150–400)
RBC: 3.1 MIL/uL — ABNORMAL LOW (ref 3.87–5.11)
RDW: 15.5 % (ref 11.5–15.5)
WBC: 16.7 10*3/uL — ABNORMAL HIGH (ref 4.0–10.5)
nRBC: 0 % (ref 0.0–0.2)

## 2023-06-21 LAB — ECHOCARDIOGRAM COMPLETE
AR max vel: 1.55 cm2
AV Area VTI: 1.59 cm2
AV Area mean vel: 1.38 cm2
AV Mean grad: 4 mm[Hg]
AV Peak grad: 8.2 mm[Hg]
Ao pk vel: 1.43 m/s
Area-P 1/2: 5.46 cm2
Height: 51 in
MV VTI: 2.67 cm2
P 1/2 time: 308 ms
S' Lateral: 1.7 cm
Weight: 1622.59 [oz_av]

## 2023-06-21 LAB — BLOOD GAS, VENOUS
Acid-Base Excess: 3.3 mmol/L — ABNORMAL HIGH (ref 0.0–2.0)
Bicarbonate: 27.8 mmol/L (ref 20.0–28.0)
O2 Saturation: 86.9 %
Patient temperature: 37
pCO2, Ven: 41 mm[Hg] — ABNORMAL LOW (ref 44–60)
pH, Ven: 7.44 — ABNORMAL HIGH (ref 7.25–7.43)
pO2, Ven: 56 mm[Hg] — ABNORMAL HIGH (ref 32–45)

## 2023-06-21 LAB — MAGNESIUM
Magnesium: 1.7 mg/dL (ref 1.7–2.4)
Magnesium: 1.7 mg/dL (ref 1.7–2.4)

## 2023-06-21 LAB — CBG MONITORING, ED
Glucose-Capillary: 135 mg/dL — ABNORMAL HIGH (ref 70–99)
Glucose-Capillary: 157 mg/dL — ABNORMAL HIGH (ref 70–99)
Glucose-Capillary: 200 mg/dL — ABNORMAL HIGH (ref 70–99)

## 2023-06-21 LAB — GLUCOSE, CAPILLARY: Glucose-Capillary: 249 mg/dL — ABNORMAL HIGH (ref 70–99)

## 2023-06-21 LAB — LACTIC ACID, PLASMA: Lactic Acid, Venous: 1.5 mmol/L (ref 0.5–1.9)

## 2023-06-21 LAB — PHOSPHORUS: Phosphorus: 2.7 mg/dL (ref 2.5–4.6)

## 2023-06-21 MED ORDER — MAGNESIUM SULFATE 2 GM/50ML IV SOLN
2.0000 g | Freq: Once | INTRAVENOUS | Status: AC
  Administered 2023-06-21: 2 g via INTRAVENOUS
  Filled 2023-06-21: qty 50

## 2023-06-21 MED ORDER — POTASSIUM CHLORIDE CRYS ER 20 MEQ PO TBCR
40.0000 meq | EXTENDED_RELEASE_TABLET | Freq: Once | ORAL | Status: AC
  Administered 2023-06-21: 40 meq via ORAL
  Filled 2023-06-21: qty 2

## 2023-06-21 MED ORDER — METHYLPREDNISOLONE SODIUM SUCC 125 MG IJ SOLR
60.0000 mg | Freq: Two times a day (BID) | INTRAMUSCULAR | Status: DC
  Administered 2023-06-21 – 2023-06-24 (×6): 60 mg via INTRAVENOUS
  Filled 2023-06-21 (×6): qty 2

## 2023-06-21 MED ORDER — FUROSEMIDE 10 MG/ML IJ SOLN
20.0000 mg | Freq: Once | INTRAMUSCULAR | Status: AC
  Administered 2023-06-21: 20 mg via INTRAVENOUS
  Filled 2023-06-21: qty 4

## 2023-06-21 NOTE — Progress Notes (Signed)
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   Patient presents today with symptoms of cough and weakness. Symptoms worsened around Friday of last week. Prior to that, she and her son report that she was in her usual state of health. They deny sore throat, fever, chills, night sweats, or weight changes. They report no sick contacts, and no body in the family is sick with any viral illness. She denies any chest pain or chest tightness.   Patient is followed by me in clinic for ILD. I last saw her on 04/08/2023. 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 Opted out of surgical biopsy given age following shared decision making discussion with patient and family. We did empirically treate with steroids which showed significant improvement in symptoms and radiographic improvement on repeat chest CT in September of 2024 following discontinuation of steroid taper.   Patient is originally from [place] where she was born and lived up until 6 years ago.  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  Patient reports using wood sticks for fuel for around 20 years of her life, after which she switched to coal briefly and then kerosene/diesel. She moved in with her son 6 years ago in [place].  They have lived in the same house (new build).  There is no basement, no water damage, and no mold in the house. No sick contacts reported  Pertinent  Medical History   Patient's past medical history includes breast cancer status post bilateral mastectomy and chemoradiation. She also has a  history of depression and anxiety  Interim History / Subjective:   Did develop increased shortness of breath yesterday, improved this AM. Continues to be short of breath. Family at the bedside (son and daughter in law).  Objective   Blood pressure 112/85, pulse (!) 102, temperature 98.2 F (36.8 C), temperature source Oral, resp. rate (!) 33, height 4\' 3"  (1.295 m), weight 46 kg, SpO2 98%.        Intake/Output Summary (Last 24 hours) at 06/21/2023 1556 Last data filed at 06/21/2023 1505 Gross per 24 hour  Intake 415.03 ml  Output 3000 ml  Net -2584.97 ml   Filed Weights   06/19/23 1803  Weight: 46 kg    Examination: Physical Exam Constitutional:      General: She is not in acute distress.    Appearance: She is ill-appearing.  Cardiovascular:     Rate and Rhythm: Normal rate and regular rhythm.  Pulmonary:     Effort: Respiratory distress present.     Breath sounds: Rhonchi and rales present. No wheezing.  Neurological:     General: No focal deficit present.     Mental Status: She is alert. Mental status is at baseline.     Motor: Weakness present.     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 She presents with a few days history of increased  shortness of breath and hypoxia. Repeat chest CT yesterday showed underlying ILD with super imposed ground glass opacities and consolidative opacities concerning for infection vs. ILD flare.  It is my impression that these imaging findings most likely represent an infectious process though it is difficult to appropriately rule out a flare of her ILD given we've not had tissue confirmation of ILD process. Patient did finish a course of prednisone in September (received PJP prophylaxis with Bactrim) with improvement on repeat CT at that time.  Infectious workup has been negative so far (viral panel, cultures, fungal markers pending). Would continue antibiotics and steroids for now (switch methyl-pred to 60 bid). Would initiate chest physiotherapy with incentive spirometer and flutter device. Patient would benefit from a room with a window to decrease risk of delirium.  Finally, she did have increased shortness of breath yesterday with ABG showing metabolic acidosis. LA was minimally elevated, improved on repeat. BHB also increased, likely a combination of hyperglycemia and starvation ketosis. Repeat blood gas showed significant improvement.   -O2 supplementation, goal SpO2 > 92% -continue steroids -check beta-D-glucan -check aspergillus galactomannan -check cryptococcal antigen -respiratory cultures -continue CAP coverage (Ceftriaxone and Azithromycin) -we will continue to follow  [name], MD  Pulmonary Critical Care 06/21/2023 4:12 PM   Labs   CBC: Recent Labs  Lab 06/19/23 1918 06/20/23 0510 06/20/23 0928 06/20/23 2326 06/21/23 0546  WBC 13.5* 12.4* 14.8* 13.6* 16.7*  NEUTROABS  --   --  11.6*  --  14.0*  HGB 8.8* 7.7* 7.5* 6.9* 7.7*  HCT 27.6* 23.6* 24.1* 21.1* 23.4*  MCV 75.2* 76.4* 77.5* 75.9* 75.5*  PLT 488* 368 431* 334 406*    Basic Metabolic Panel: Recent Labs  Lab 06/19/23 1918 06/20/23 0510 06/20/23 1912 06/20/23 2326 06/21/23 0546  NA 127* 128* 125* 132* 136  K 5.2* 3.8 4.2 3.7 3.4*  CL 97* 102 96* 100 100  CO2 20* 21* 19* 24 27  GLUCOSE 182* 156* 272* 194* 147*  BUN 29* 22 18 19 18   CREATININE 1.20* 0.87 0.93 0.92 0.98  CALCIUM 9.6 8.3* 8.3* 7.9* 8.2*  MG  --   --   --  1.7 1.7  PHOS  --   --   --  2.7  --    GFR: Estimated Creatinine Clearance: 22.9 mL/min (by C-G formula based on SCr of 0.98 mg/dL). Recent Labs  Lab 06/20/23 0510 06/20/23 0928 06/20/23 1912 06/20/23 2127 06/20/23 2325 06/20/23 2326 06/21/23 0546  PROCALCITON 0.32  --   --   --   --   --   --   WBC  12.4* 14.8*  --   --   --  13.6* 16.7*  LATICACIDVEN  --  1.6 3.2* 1.5 1.5  --   --     Liver Function Tests: Recent Labs  Lab 06/19/23 1918 06/20/23 1912 06/20/23 2326 06/21/23 0546  AST 89* 144* 168* 173*  ALT 53* 117* 130* 139*  ALKPHOS 96 167* 148* 150*  BILITOT 0.5 0.9 0.5 0.3  PROT 7.5 6.6 5.7* 6.1*  ALBUMIN 3.0* 2.7* 2.3* 2.6*   No results for input(s): "LIPASE", "AMYLASE" in the last 168 hours. No results for input(s): "AMMONIA" in the last 168 hours.  ABG    Component Value Date/Time   PHART 7.3 (L) 06/20/2023 1829   PCO2ART 30 (L) 06/20/2023 1829   PO2ART 69 (L) 06/20/2023 1829   HCO3 27.8 06/21/2023 0546   ACIDBASEDEF 10.3 (H) 06/20/2023 1829   O2SAT 86.9 06/21/2023  0865     Coagulation Profile: Recent Labs  Lab 06/19/23 1918 06/20/23 0510  INR 1.1 1.1    Cardiac Enzymes: No results for input(s): "CKTOTAL", "CKMB", "CKMBINDEX", "TROPONINI" in the last 168 hours.  HbA1C: Hgb A1c MFr Bld  Date/Time Value Ref Range Status  02/18/2023 03:43 PM 6.1 (H) 4.8 - 5.6 % Final    Comment:             Prediabetes: 5.7 - 6.4          Diabetes: >6.4          Glycemic control for adults with diabetes: <7.0   08/27/2022 04:35 PM 6.6 (H) 4.8 - 5.6 % Final    Comment:             Prediabetes: 5.7 - 6.4          Diabetes: >6.4          Glycemic control for adults with diabetes: <7.0     CBG: Recent Labs  Lab 06/20/23 1204 06/20/23 1828 06/20/23 2153 06/21/23 0800 06/21/23 1144  GLUCAP 110* 251* 226* 135* 157*    Past Medical History:  She,  has a past medical history of Breast cancer (HCC), Cataract, Diabetes mellitus without complication (HCC), Hyperlipidemia, Hypertension, and Thyroid disease.   Surgical History:   Past Surgical History:  Procedure Laterality Date   BREAST SURGERY     EYE SURGERY       Social History:   reports that she has never smoked. She has never used smokeless tobacco. She reports that she does not drink alcohol and  does not use drugs.   Family History:  Her family history includes Healthy in her brother, daughter, daughter, daughter, sister, and sister.   Allergies No Known Allergies   Home Medications  Prior to Admission medications   Medication Sig Start Date End Date Taking? Authorizing Provider  alendronate (FOSAMAX) 70 MG tablet Take 1 tablet (70 mg total) by mouth every 7 (seven) days. Take with a full glass of water on an empty stomach. 02/05/23  Yes Simmons-Robinson, Makiera, MD  atorvastatin (LIPITOR) 20 MG tablet Take 1 tablet (20 mg total) by mouth daily. 08/27/22  Yes Bacigalupo, Marzella Schlein, MD  budesonide (PULMICORT) 0.5 MG/2ML nebulizer solution Take 2 mLs (0.5 mg total) by nebulization in the morning and at bedtime. 06/18/23 06/17/24 Yes Loyda Costin, Lianne Bushy, MD  Calcium Carbonate-Vitamin D 600-400 MG-UNIT tablet Take 1 tablet by mouth daily.    Yes [provider]  famotidine (PEPCID) 20 MG tablet Take 1 tablet (20 mg total) by mouth 2 (two) times daily. 01/01/23 01/01/24 Yes Bacigalupo, Marzella Schlein, MD  loratadine (CLARITIN) 10 MG tablet Take 1 tablet (10 mg total) by mouth daily. 04/08/23 04/07/24 Yes Alnisa Hasley, Lianne Bushy, MD  metFORMIN (GLUCOPHAGE) 500 MG tablet Take 1 tablet (500 mg total) by mouth 2 (two) times daily with a meal. 03/14/23  Yes Bacigalupo, Marzella Schlein, MD  omeprazole (PRILOSEC) 20 MG capsule Take 1 capsule (20 mg total) by mouth daily. 06/05/23  Yes Jacky Kindle, FNP  propranolol (INDERAL) 20 MG tablet Take 1 tablet (20 mg total) by mouth 2 (two) times daily. 04/22/23 10/19/23 Yes Bacigalupo, Marzella Schlein, MD  sertraline (ZOLOFT) 25 MG tablet Take 1 tablet (25 mg total) by mouth daily. 04/19/23  Yes Bacigalupo, Marzella Schlein, MD     I spent 37 minutes caring for this patient today, including preparing to see the patient, obtaining a medical history , reviewing a separately obtained  history, performing a medically appropriate examination and/or evaluation, counseling and educating the  patient/family/caregiver, referring and communicating with other health care professionals (not separately reported), and documenting clinical information in the electronic health record

## 2023-06-21 NOTE — Progress Notes (Signed)
*  PRELIMINARY RESULTS* Echocardiogram 2D Echocardiogram has been performed.  Erin Wagner 06/21/2023, 10:44 AM

## 2023-06-21 NOTE — Progress Notes (Signed)
PROGRESS NOTE    Erin Wagner  ZOX:[id number] DOB: 04/19/1941 DOA: 06/19/2023 PCP: Erasmo Downer, MD   Assessment & Plan:   Principal Problem:   Sepsis due to pneumonia [facility]) Active Problems:   Hypothyroidism   Elevated troponin I level   Hyponatremia   Type 2 diabetes mellitus without complications (HCC)   Essential hypertension   History of breast cancer   Depression   Dyslipidemia   GERD without esophagitis   Interstitial lung disease (HCC)   Acute respiratory failure with hypoxia (HCC)   Elevated troponin  Assessment and Plan: Sepsis: met criteria w/ tachypnea, leukocytosis & pneumonia. Continue on IV zosyn. Hx of ILD & possibly pulmonary fibrosis as per CT finding. Continue on IV steroids, bronchodilators and encourage incentive spirometry. Beta- d glucan, aspergillus, cryptococcal are pending as per pulmon. Pulmon following and recs apprec.   Pneumonia: continue on IV zosyn, steroids, bronchodilators & encourage incentive spirometry. Beta d-glucan, aspergillus, cryptococcal are pending as per pulmon.   Hyponatremia: resolved   Elevated troponin: likely secondary to demand ischemia in setting of sepsis   Hypothyroidism: continue on home dose of synthroid    DM2: well controlled, HbA1c 6.1. Holding home metformin. Continue on SSI w/ accuchecks   Transaminitis: likely secondary to sepsis. No acute findings on CT abd/pelvis. US gallbladder ordered.    HTN: holding home propranolol    ILD: continue on bronchodilators. Continue w/ supportive care    GERD: continue on PPI    HLD: not on a statin as per med rec    Depression: severity unknown. Continue on home dose of sertraline       DVT prophylaxis: lovenox  Code Status: full  Family Communication:  Disposition Plan: depends on PT/OT recs (not consulted yet).  Level of care: Progressive Consultants:  pulmon  Procedures:   Antimicrobials: zosyn   Subjective: Pt c/o cough    Objective: Vitals:   06/21/23 0330 06/21/23 0430 06/21/23 0657 06/21/23 0800  BP: (!) 143/77 (!) 147/77  (!) 144/72  Pulse: 94 93  94  Resp: (!) 23 (!) 25  (!) 30  Temp:   (!) 97.5 F (36.4 C)   TempSrc:   Oral   SpO2: 100% 100%  94%  Weight:      Height:        Intake/Output Summary (Last 24 hours) at 06/21/2023 0857 Last data filed at 06/21/2023 0428 Gross per 24 hour  Intake 350.68 ml  Output 1100 ml  Net -749.32 ml   Filed Weights   06/19/23 1803  Weight: 46 kg    Examination:  General exam: Appears calm and comfortable  Respiratory system: course breath sounds b/l  Cardiovascular system: S1 & S2 +. No rubs, gallops or clicks. Gastrointestinal system: Abdomen is nondistended, soft and nontender. Normal bowel sounds heard. Central nervous system: Alert and oriented. Moves all extremities  Psychiatry: Judgement and insight appear normal. Mood & affect appropriate.     Data Reviewed: I have personally reviewed following labs and imaging studies  CBC: Recent Labs  Lab 06/19/23 1918 06/20/23 0510 06/20/23 0928 06/20/23 2326 06/21/23 0546  WBC 13.5* 12.4* 14.8* 13.6* 16.7*  NEUTROABS  --   --  11.6*  --  14.0*  HGB 8.8* 7.7* 7.5* 6.9* 7.7*  HCT 27.6* 23.6* 24.1* 21.1* 23.4*  MCV 75.2* 76.4* 77.5* 75.9* 75.5*  PLT 488* 368 431* 334 406*   Basic Metabolic Panel: Recent Labs  Lab 06/19/23 1918 06/20/23 0510 06/20/23 1912 06/20/23 2326 06/21/23  0546  NA 127* 128* 125* 132* 136  K 5.2* 3.8 4.2 3.7 3.4*  CL 97* 102 96* 100 100  CO2 20* 21* 19* 24 27  GLUCOSE 182* 156* 272* 194* 147*  BUN 29* 22 18 19 18   CREATININE 1.20* 0.87 0.93 0.92 0.98  CALCIUM 9.6 8.3* 8.3* 7.9* 8.2*  MG  --   --   --  1.7 1.7  PHOS  --   --   --  2.7  --    GFR: Estimated Creatinine Clearance: 22.9 mL/min (by C-G formula based on SCr of 0.98 mg/dL). Liver Function Tests: Recent Labs  Lab 06/19/23 1918 06/20/23 1912 06/20/23 2326 06/21/23 0546  AST 89* 144* 168* 173*   ALT 53* 117* 130* 139*  ALKPHOS 96 167* 148* 150*  BILITOT 0.5 0.9 0.5 0.3  PROT 7.5 6.6 5.7* 6.1*  ALBUMIN 3.0* 2.7* 2.3* 2.6*   No results for input(s): "LIPASE", "AMYLASE" in the last 168 hours. No results for input(s): "AMMONIA" in the last 168 hours. Coagulation Profile: Recent Labs  Lab 06/19/23 1918 06/20/23 0510  INR 1.1 1.1   Cardiac Enzymes: No results for input(s): "CKTOTAL", "CKMB", "CKMBINDEX", "TROPONINI" in the last 168 hours. BNP (last 3 results) No results for input(s): "PROBNP" in the last 8760 hours. HbA1C: No results for input(s): "HGBA1C" in the last 72 hours. CBG: Recent Labs  Lab 06/20/23 0818 06/20/23 1204 06/20/23 1828 06/20/23 2153 06/21/23 0800  GLUCAP 138* 110* 251* 226* 135*   Lipid Profile: No results for input(s): "CHOL", "HDL", "LDLCALC", "TRIG", "CHOLHDL", "LDLDIRECT" in the last 72 hours. Thyroid Function Tests: Recent Labs    06/19/23 1918  TSH 2.165  FREET4 1.75*   Anemia Panel: Recent Labs    06/20/23 0928  FERRITIN 25  TIBC 322  IRON 10*   Sepsis Labs: Recent Labs  Lab 06/20/23 0510 06/20/23 0928 06/20/23 1912 06/20/23 2127 06/20/23 2325  PROCALCITON 0.32  --   --   --   --   LATICACIDVEN  --  1.6 3.2* 1.5 1.5    Recent Results (from the past 240 hours)  Blood Culture (routine x 2)     Status: None (Preliminary result)   Collection Time: 06/19/23  7:19 PM   Specimen: BLOOD  Result Value Ref Range Status   Specimen Description BLOOD BLOOD LEFT ARM  Final   Special Requests   Final    BOTTLES DRAWN AEROBIC AND ANAEROBIC Blood Culture adequate volume   Culture   Final    NO GROWTH 2 DAYS Performed at [facility], [address] [place], [place] [address]    Report Status PENDING  Incomplete  Blood Culture (routine x 2)     Status: None (Preliminary result)   Collection Time: 06/19/23  7:19 PM   Specimen: BLOOD  Result Value Ref Range Status   Specimen Description BLOOD BLOOD RIGHT ARM  Final    Special Requests   Final    BOTTLES DRAWN AEROBIC AND ANAEROBIC Blood Culture adequate volume   Culture   Final    NO GROWTH 2 DAYS Performed at [facility], [address], [place], [place] [address]    Report Status PENDING  Incomplete  Resp panel by RT-PCR (RSV, Flu A&B, Covid) Anterior Nasal Swab     Status: None   Collection Time: 06/19/23  8:03 PM   Specimen: Anterior Nasal Swab  Result Value Ref Range Status   SARS Coronavirus 2 by RT PCR NEGATIVE NEGATIVE Final  Comment: (NOTE) SARS-CoV-2 target nucleic acids are NOT DETECTED.  The SARS-CoV-2 RNA is generally detectable in upper respiratory specimens during the acute phase of infection. The lowest concentration of SARS-CoV-2 viral copies this assay can detect is 138 copies/mL. A negative result does not preclude SARS-Cov-2 infection and should not be used as the sole basis for treatment or other patient management decisions. A negative result may occur with  improper specimen collection/handling, submission of specimen other than nasopharyngeal swab, presence of viral mutation(s) within the areas targeted by this assay, and inadequate number of viral copies(<138 copies/mL). A negative result must be combined with clinical observations, patient history, and epidemiological information. The expected result is Negative.  Fact Sheet for Patients:  BloggerCourse.com  Fact Sheet for Healthcare Providers:  SeriousBroker.it  This test is no t yet approved or cleared by the [place] FDA and  has been authorized for detection and/or diagnosis of SARS-CoV-2 by FDA under an Emergency Use Authorization (EUA). This EUA will remain  in effect (meaning this test can be used) for the duration of the COVID-19 declaration under Section 564(b)(1) of the Act, 21 U.S.C.section 360bbb-3(b)(1), unless the authorization is terminated  or revoked sooner.       Influenza A  by PCR NEGATIVE NEGATIVE Final   Influenza B by PCR NEGATIVE NEGATIVE Final    Comment: (NOTE) The Xpert Xpress SARS-CoV-2/FLU/RSV plus assay is intended as an aid in the diagnosis of influenza from Nasopharyngeal swab specimens and should not be used as a sole basis for treatment. Nasal washings and aspirates are unacceptable for Xpert Xpress SARS-CoV-2/FLU/RSV testing.  Fact Sheet for Patients: BloggerCourse.com  Fact Sheet for Healthcare Providers: SeriousBroker.it  This test is not yet approved or cleared by the [place] FDA and has been authorized for detection and/or diagnosis of SARS-CoV-2 by FDA under an Emergency Use Authorization (EUA). This EUA will remain in effect (meaning this test can be used) for the duration of the COVID-19 declaration under Section 564(b)(1) of the Act, 21 U.S.C. section 360bbb-3(b)(1), unless the authorization is terminated or revoked.     Resp Syncytial Virus by PCR NEGATIVE NEGATIVE Final    Comment: (NOTE) Fact Sheet for Patients: BloggerCourse.com  Fact Sheet for Healthcare Providers: SeriousBroker.it  This test is not yet approved or cleared by the [place] FDA and has been authorized for detection and/or diagnosis of SARS-CoV-2 by FDA under an Emergency Use Authorization (EUA). This EUA will remain in effect (meaning this test can be used) for the duration of the COVID-19 declaration under Section 564(b)(1) of the Act, 21 U.S.C. section 360bbb-3(b)(1), unless the authorization is terminated or revoked.  Performed at [facility], [address] [place], [place] [address]   Respiratory (~20 pathogens) panel by PCR     Status: None   Collection Time: 06/20/23  9:28 AM   Specimen: Nasopharyngeal Swab; Respiratory  Result Value Ref Range Status   Adenovirus NOT DETECTED NOT DETECTED Final   Coronavirus 229E NOT  DETECTED NOT DETECTED Final    Comment: (NOTE) The Coronavirus on the Respiratory Panel, DOES NOT test for the novel  Coronavirus (2019 nCoV)    Coronavirus HKU1 NOT DETECTED NOT DETECTED Final   Coronavirus NL63 NOT DETECTED NOT DETECTED Final   Coronavirus OC43 NOT DETECTED NOT DETECTED Final   Metapneumovirus NOT DETECTED NOT DETECTED Final   Rhinovirus / Enterovirus NOT DETECTED NOT DETECTED Final   Influenza A NOT DETECTED NOT DETECTED Final   Influenza B NOT DETECTED  NOT DETECTED Final   Parainfluenza Virus 1 NOT DETECTED NOT DETECTED Final   Parainfluenza Virus 2 NOT DETECTED NOT DETECTED Final   Parainfluenza Virus 3 NOT DETECTED NOT DETECTED Final   Parainfluenza Virus 4 NOT DETECTED NOT DETECTED Final   Respiratory Syncytial Virus NOT DETECTED NOT DETECTED Final   Bordetella pertussis NOT DETECTED NOT DETECTED Final   Bordetella Parapertussis NOT DETECTED NOT DETECTED Final   Chlamydophila pneumoniae NOT DETECTED NOT DETECTED Final   Mycoplasma pneumoniae NOT DETECTED NOT DETECTED Final    Comment: Performed at [facility], [address] [place], [place] [address]  MRSA Next Gen by PCR, Nasal     Status: None   Collection Time: 06/20/23  9:28 AM   Specimen: Nasopharyngeal Swab; Nasal Swab  Result Value Ref Range Status   MRSA by PCR Next Gen NOT DETECTED NOT DETECTED Final    Comment: (NOTE) The GeneXpert MRSA Assay (FDA approved for NASAL specimens only), is one component of a comprehensive MRSA colonization surveillance program. It is not intended to diagnose MRSA infection nor to guide or monitor treatment for MRSA infections. Test performance is not FDA approved in patients less than 44 years old. Performed at [facility], [address] [place], [place] [address]          Radiology Studies: CT Angio Abd/Pel w/ and/or w/o Result Date: 06/20/2023 CLINICAL DATA:  Mesenteric ischemia. History of breast cancer diabetes and hypertension.  EXAM: CTA ABDOMEN AND PELVIS WITHOUT AND WITH CONTRAST TECHNIQUE: Multidetector CT imaging of the abdomen and pelvis was performed using the standard protocol during bolus administration of intravenous contrast. Multiplanar reconstructed images and MIPs were obtained and reviewed to evaluate the vascular anatomy. RADIATION DOSE REDUCTION: This exam was performed according to the departmental dose-optimization program which includes automated exposure control, adjustment of the mA and/or kV according to patient size and/or use of iterative reconstruction technique. CONTRAST:  OMNIPAQUE IOHEXOL 350 MG/ML SOLN COMPARISON:  Chest CT dated 06/20/2023 and CT abdomen pelvis dated 02/06/2018. FINDINGS: VASCULAR Aorta: Mild atherosclerotic calcification. No aneurysmal dilatation or dissection. No periaortic fluid collection. Celiac: Patent without evidence of aneurysm, dissection, vasculitis or significant stenosis. SMA: Patent without evidence of aneurysm, dissection, vasculitis or significant stenosis. Renals: Atherosclerotic calcification of the origins of the renal arteries. The renal arteries are otherwise patent. IMA: The IMA is patent. Inflow: Mild atherosclerotic calcification of the iliac arteries. The iliac arteries are patent. No aneurysmal dilatation or dissection. Proximal Outflow: The visualized proximal of fluid is patent. Veins: The IVC is unremarkable. The SMV, splenic vein, and main portal vein are patent. No portal venous gas. Review of the MIP images confirms the above findings. NON-VASCULAR Lower chest: See chest CT report. No intra-abdominal free air or free fluid. Hepatobiliary: The liver is unremarkable. There is trace perihepatic fluid. No calcified gallstone. Ultrasound may provide better evaluation if there is clinical concern for for acute gallbladder pathology. Pancreas: Unremarkable. No pancreatic ductal dilatation or surrounding inflammatory changes. Spleen: Normal in size without focal  abnormality. Adrenals/Urinary Tract: The adrenal glands are unremarkable. There is a 4.5 cm right renal upper pole cyst. There is no hydronephrosis on either side. The visualized ureters and urinary bladder appear unremarkable. Stomach/Bowel: Small hiatal hernia. There is no bowel obstruction or active inflammation. The appendix is normal. Lymphatic: No adenopathy. Reproductive: The uterus is grossly unremarkable. No adnexal masses. 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 No acute osseous pathology. Degenerative changes. IMPRESSION: 1. No acute intra-abdominal or pelvic pathology. No CT evidence of mesenteric ischemia. 2. Small hiatal hernia. No bowel obstruction. Normal appendix. Electronically Signed   By: Elgie Collard M.D.   On: 06/20/2023 20:49   CT CHEST WO CONTRAST Result Date: 06/20/2023 CLINICAL DATA:  Respiratory failure.  Interstitial lung disease EXAM: CT CHEST WITHOUT CONTRAST TECHNIQUE: Multidetector CT imaging of the chest was performed following the standard protocol without IV contrast. RADIATION DOSE REDUCTION: This exam was performed according to the departmental dose-optimization program which includes automated exposure control, adjustment of the mA and/or kV according to patient size and/or use of iterative reconstruction technique. COMPARISON:  X-ray 06/19/2023, CT 03/12/2023 FINDINGS: Cardiovascular: Mild cardiomegaly. No pericardial effusion. Relative hypoattenuation of the cardiac blood pool indicative of anemia. Thoracic aorta is nonaneurysmal. Scattered atherosclerotic vascular calcifications of the aorta and coronary arteries. Central pulmonary vasculature is within normal limits. Mediastinum/Nodes: No lymphadenopathy is seen. Tiny hiatal hernia. Thyroid gland and trachea within normal limits. Lungs/Pleura: Extensive airspace  consolidations throughout both lungs. 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 Trace bilateral pleural effusions. No pneumothorax. Upper Abdomen: No acute abnormality. Musculoskeletal: Postsurgical changes to the anterior chest wall from bilateral mastectomies with a similar degree of postoperative scarring. No acute osseous abnormality or suspicious bone lesions identified. IMPRESSION: 1. Extensive airspace consolidations throughout both lungs, most compatible with multifocal pneumonia. 2. Background of pulmonary fibrosis. 3. Trace bilateral pleural effusions. 4. Mild cardiomegaly. 5. Aortic and coronary artery atherosclerosis (ICD10-I70.0). Electronically Signed   By: Duanne Guess D.O.   On: 06/20/2023 12:13   DG Chest Port 1 View Result Date: 06/19/2023 CLINICAL DATA:  Shortness of breath, weakness, and cough EXAM: PORTABLE CHEST 1 VIEW COMPARISON:  Chest radiograph dated 08/27/2022 FINDINGS: Normal lung volumes. Bilateral peripheral reticulations with diffusely increased patchy opacities, left-greater-than-right. No pleural effusion or pneumothorax. Mildly enlarged cardiomediastinal silhouette. no acute osseous abnormality. IMPRESSION: 1. Bilateral peripheral reticulations with diffusely increased patchy opacities, left-greater-than-right, which may represent atypical infection superimposed on chronic interstitial lung disease. 2. Mild cardiomegaly. Electronically Signed   By: Agustin Cree M.D.   On: 06/19/2023 19:43        Scheduled Meds:  budesonide  0.5 mg Nebulization BID   enoxaparin (LOVENOX) injection  30 mg Subcutaneous Q24H   guaiFENesin  600 mg Oral BID   insulin aspart  0-15 Units Subcutaneous TID AC & HS   ipratropium-albuterol  3 mL Nebulization QID   levothyroxine  25 mcg Oral Q0600   loratadine  10 mg Oral Daily   methylPREDNISolone (SOLU-MEDROL) injection  120 mg Intravenous Daily   pantoprazole  40 mg Oral Daily   sertraline  25 mg Oral Daily    Continuous Infusions:  azithromycin Stopped (06/20/23 2329)   piperacillin-tazobactam (ZOSYN)  IV 3.375 g (06/21/23 0556)     LOS: 2 days     Charise Killian, MD [facility] Pager [phone]  If 7PM-7AM, please contact night-coverage [email or web address] 06/21/2023, 8:57 AM

## 2023-06-21 NOTE — ED Notes (Signed)
Echo at bedside

## 2023-06-21 NOTE — ED Notes (Signed)
RN called to room to assist pt with voiding. While assisting with bedpan, pt brief noted to be saturated. Brief was removed and pt placed on bedpan. While pt on bedpan, and while linen change being completed, pt breathing became extremely labored with Spo2 dropping to 79-84%. Bedpan and linen change completed and pt was positioned upright in bed and encouraged to take slow controlled breaths. After several minutes, breathing improved and Spo2 regulated. About this time, NP Jon Billings presented to room to assess pt and RN informed provider of events. Informed provider of 2 prior failed attempts at using purewick as pt would not tolerate, and made request for Foley placement. Verbal order was granted for such and foley was then placed. Foley placed utilizing sterile technique.   Of note, throughout all prior mentioned events, RN attempted to utilize telephone interpreter with no success due to difference in dialect. As such, pt grandson was utilized as Equities trader between staff and pt.

## 2023-06-22 DIAGNOSIS — A419 Sepsis, unspecified organism: Secondary | ICD-10-CM | POA: Diagnosis not present

## 2023-06-22 DIAGNOSIS — J9601 Acute respiratory failure with hypoxia: Secondary | ICD-10-CM | POA: Diagnosis not present

## 2023-06-22 DIAGNOSIS — J849 Interstitial pulmonary disease, unspecified: Secondary | ICD-10-CM | POA: Diagnosis not present

## 2023-06-22 LAB — GLUCOSE, CAPILLARY
Glucose-Capillary: 184 mg/dL — ABNORMAL HIGH (ref 70–99)
Glucose-Capillary: 201 mg/dL — ABNORMAL HIGH (ref 70–99)
Glucose-Capillary: 224 mg/dL — ABNORMAL HIGH (ref 70–99)
Glucose-Capillary: 265 mg/dL — ABNORMAL HIGH (ref 70–99)

## 2023-06-22 LAB — CBC
HCT: 23.3 % — ABNORMAL LOW (ref 36.0–46.0)
Hemoglobin: 7.7 g/dL — ABNORMAL LOW (ref 12.0–15.0)
MCH: 24 pg — ABNORMAL LOW (ref 26.0–34.0)
MCHC: 33 g/dL (ref 30.0–36.0)
MCV: 72.6 fL — ABNORMAL LOW (ref 80.0–100.0)
Platelets: 450 10*3/uL — ABNORMAL HIGH (ref 150–400)
RBC: 3.21 MIL/uL — ABNORMAL LOW (ref 3.87–5.11)
RDW: 15.6 % — ABNORMAL HIGH (ref 11.5–15.5)
WBC: 16.5 10*3/uL — ABNORMAL HIGH (ref 4.0–10.5)
nRBC: 0.1 % (ref 0.0–0.2)

## 2023-06-22 LAB — BASIC METABOLIC PANEL
Anion gap: 10 (ref 5–15)
BUN: 18 mg/dL (ref 8–23)
CO2: 27 mmol/L (ref 22–32)
Calcium: 8.3 mg/dL — ABNORMAL LOW (ref 8.9–10.3)
Chloride: 99 mmol/L (ref 98–111)
Creatinine, Ser: 0.91 mg/dL (ref 0.44–1.00)
GFR, Estimated: >60 mL/min (ref >60)
Glucose, Bld: 151 mg/dL — ABNORMAL HIGH (ref 70–99)
Potassium: 3.6 mmol/L (ref 3.5–5.1)
Sodium: 136 mmol/L (ref 135–145)

## 2023-06-22 MED ORDER — CHLORHEXIDINE GLUCONATE CLOTH 2 % EX PADS
6.0000 | MEDICATED_PAD | Freq: Every day | CUTANEOUS | Status: DC
  Administered 2023-06-22 – 2023-06-26 (×6): 6 via TOPICAL

## 2023-06-22 NOTE — Progress Notes (Signed)
PROGRESS NOTE   HPI was taken from Dr. Arville Care: Erin Wagner is a 82 y.o. female with medical history significant for breast cancer in remission on tamoxifen, hypertension, dyslipidemia, interstitial lung disease and hypothyroidism, who presented to the emergency room with acute onset of cough and dyspnea last 7 days.  She has been having increased work of breathing productive cough after having taking a recent steroid taper.  No fever or chills.  No nausea or vomiting or abdominal pain.  No excessive wheezing.  No chest pain or palpitations.  No dysuria, oliguria or hematuria or flank pain.  She admitted to generalized weakness.  No bleeding diathesis.   ED Course: When she came to the ER, BP was 90/50 with respiratory to 36 with pulse oximetry of 86% on room air.  Labs revealed hyponatremia 127 hypochloremia of 97, hyperkalemia of 5.2, blood glucose of 182, BUN of 29 creatinine 1.2 above previous levels, albumin 3 and total protein 7.5, AST 89 and ALT 53 and high sensitive troponin I was 104 and later 91.  Lactic acid was 4.7 and later 3.2.  CBC showed leukocytosis 13.5 and anemia with hemoglobin 8.8 hematocrit 27.6 with microcytosis lower than in August and thrombocytosis of 488.  PTT was 39 with normal INR and PTT.  TSH was 2.165 and free T41.75.  Respiratory panel came back negative.  Blood cultures were drawn. EKG as reviewed by me : EKG showed normal sinus rhythm with rate of 94 with poor R wave progression with T wave inversion anterolaterally and inferiorly. Imaging: Portable chest x-ray showed the following: 1. Bilateral peripheral reticulations with diffusely increased patchy opacities, left-greater-than-right, which may represent atypical infection superimposed on chronic interstitial lung disease. 2. Mild cardiomegaly.   The patient was given IV vancomycin, cefepime and Zithromax, Pulmicort, 100 mg of IV Solu-Medrol and 1.5 L of IV normal saline.  She will be admitted to a  medical telemetry bed for further evaluation and management.   JISELE LERER  ZOX:[id number] DOB: 09/19/1940 DOA: 06/19/2023 PCP: Erasmo Downer, MD   Assessment & Plan:   Principal Problem:   Sepsis due to pneumonia [facility]) Active Problems:   Hypothyroidism   Elevated troponin I level   Hyponatremia   Type 2 diabetes mellitus without complications (HCC)   Essential hypertension   History of breast cancer   Depression   Dyslipidemia   GERD without esophagitis   Interstitial lung disease (HCC)   Acute hypoxemic respiratory failure (HCC)   Elevated troponin  Assessment and Plan: Sepsis: met criteria w/ tachypnea, leukocytosis & pneumonia. Continue on IV zosyn. Hx of ILD & possibly pulmonary fibrosis as per CT finding. Continue on IV steroids, bronchodilators and encourage incentive spirometry. Pulmon following and recs apprec   Pneumonia: continue on IV zosyn, IV steroids, bronchodilators & encourage incentive spirometry. Beta d-glucan, aspergillus, cryptococcal are pending as per pulmon. Flutter valve ordered  Hyponatremia: resolved   Elevated troponin: likely secondary to demand ischemia in setting of sepsis   Hypothyroidism: continue on levothyroxine    DM2: well controlled, HbA1c 6.1. Holding home metformin. Continue on SSI w/ accuchecks   Transaminitis: likely secondary to sepsis. No acute findings on CT abd/pelvis. US gallbladder neg suspicious features to suggest cholecystitis, neg for gallstones or biliary dilatation    HTN: holding home BB    ILD: continue on bronchodilators. Continue w/ supportive care. Pulmon fibrosis as per CT chest    GERD: continue on PPI    HLD: not on a statin  as per med rec    Depression: severity unknown. Continue on home dose of sertraline       DVT prophylaxis: lovenox  Code Status: full  Family Communication: discussed pt's care w/ pt's daughter in law at bedside and answered her questions  Disposition Plan:  depends on PT/OT recs (not consulted yet).  Level of care: Progressive Consultants:  pulmon  Procedures:   Antimicrobials: zosyn   Subjective: Pt still productive cough   Objective: Vitals:   06/21/23 2245 06/22/23 0355 06/22/23 0700 06/22/23 0851  BP: 110/71 136/75  126/68  Pulse: (!) 101 96 98 (!) 105  Resp: (!) 23 (!) 24 (!) 24 20  Temp: 98.1 F (36.7 C) 98.1 F (36.7 C)  98.1 F (36.7 C)  TempSrc: Oral Oral  Oral  SpO2: 95% 93% 98% 92%  Weight:      Height:        Intake/Output Summary (Last 24 hours) at 06/22/2023 1102 Last data filed at 06/22/2023 0700 Gross per 24 hour  Intake 712.66 ml  Output --  Net 712.66 ml   Filed Weights   06/19/23 1803  Weight: 46 kg    Examination:  General exam: Appears uncomfortable  Respiratory system: course breath sounds b/l  Cardiovascular system: S1/S2+. No rubs or clicks  Gastrointestinal system: abd is soft, NT, ND & hypoactive bowel sounds Central nervous system: alert  & oriented. Moves all extremities  Psychiatry: judgement and insight appears at baseline. Flat mood and affect     Data Reviewed: I have personally reviewed following labs and imaging studies  CBC: Recent Labs  Lab 06/20/23 0510 06/20/23 0928 06/20/23 2326 06/21/23 0546 06/22/23 0358  WBC 12.4* 14.8* 13.6* 16.7* 16.5*  NEUTROABS  --  11.6*  --  14.0*  --   HGB 7.7* 7.5* 6.9* 7.7* 7.7*  HCT 23.6* 24.1* 21.1* 23.4* 23.3*  MCV 76.4* 77.5* 75.9* 75.5* 72.6*  PLT 368 431* 334 406* 450*   Basic Metabolic Panel: Recent Labs  Lab 06/20/23 0510 06/20/23 1912 06/20/23 2326 06/21/23 0546 06/22/23 0358  NA 128* 125* 132* 136 136  K 3.8 4.2 3.7 3.4* 3.6  CL 102 96* 100 100 99  CO2 21* 19* 24 27 27   GLUCOSE 156* 272* 194* 147* 151*  BUN 22 18 19 18 18   CREATININE 0.87 0.93 0.92 0.98 0.91  CALCIUM 8.3* 8.3* 7.9* 8.2* 8.3*  MG  --   --  1.7 1.7  --   PHOS  --   --  2.7  --   --    GFR: Estimated Creatinine Clearance: 24.6 mL/min (by  C-G formula based on SCr of 0.91 mg/dL). Liver Function Tests: Recent Labs  Lab 06/19/23 1918 06/20/23 1912 06/20/23 2326 06/21/23 0546  AST 89* 144* 168* 173*  ALT 53* 117* 130* 139*  ALKPHOS 96 167* 148* 150*  BILITOT 0.5 0.9 0.5 0.3  PROT 7.5 6.6 5.7* 6.1*  ALBUMIN 3.0* 2.7* 2.3* 2.6*   No results for input(s): "LIPASE", "AMYLASE" in the last 168 hours. No results for input(s): "AMMONIA" in the last 168 hours. Coagulation Profile: Recent Labs  Lab 06/19/23 1918 06/20/23 0510  INR 1.1 1.1   Cardiac Enzymes: No results for input(s): "CKTOTAL", "CKMB", "CKMBINDEX", "TROPONINI" in the last 168 hours. BNP (last 3 results) No results for input(s): "PROBNP" in the last 8760 hours. HbA1C: No results for input(s): "HGBA1C" in the last 72 hours. CBG: Recent Labs  Lab 06/21/23 0800 06/21/23 1144 06/21/23 1618 06/21/23 2246 06/22/23  0850  GLUCAP 135* 157* 200* 249* 184*   Lipid Profile: No results for input(s): "CHOL", "HDL", "LDLCALC", "TRIG", "CHOLHDL", "LDLDIRECT" in the last 72 hours. Thyroid Function Tests: Recent Labs    06/19/23 1918  TSH 2.165  FREET4 1.75*   Anemia Panel: Recent Labs    06/20/23 0928  FERRITIN 25  TIBC 322  IRON 10*   Sepsis Labs: Recent Labs  Lab 06/20/23 0510 06/20/23 0928 06/20/23 1912 06/20/23 2127 06/20/23 2325  PROCALCITON 0.32  --   --   --   --   LATICACIDVEN  --  1.6 3.2* 1.5 1.5    Recent Results (from the past 240 hours)  Blood Culture (routine x 2)     Status: None (Preliminary result)   Collection Time: 06/19/23  7:19 PM   Specimen: BLOOD  Result Value Ref Range Status   Specimen Description BLOOD BLOOD LEFT ARM  Final   Special Requests   Final    BOTTLES DRAWN AEROBIC AND ANAEROBIC Blood Culture adequate volume   Culture   Final    NO GROWTH 3 DAYS Performed at [facility], [address] [place], [place] [address]    Report Status PENDING  Incomplete  Blood Culture (routine x 2)     Status:  None (Preliminary result)   Collection Time: 06/19/23  7:19 PM   Specimen: BLOOD  Result Value Ref Range Status   Specimen Description BLOOD BLOOD RIGHT ARM  Final   Special Requests   Final    BOTTLES DRAWN AEROBIC AND ANAEROBIC Blood Culture adequate volume   Culture   Final    NO GROWTH 3 DAYS Performed at [facility], [address] [place], [place] [address]    Report Status PENDING  Incomplete  Resp panel by RT-PCR (RSV, Flu A&B, Covid) Anterior Nasal Swab     Status: None   Collection Time: 06/19/23  8:03 PM   Specimen: Anterior Nasal Swab  Result Value Ref Range Status   SARS Coronavirus 2 by RT PCR NEGATIVE NEGATIVE Final    Comment: (NOTE) SARS-CoV-2 target nucleic acids are NOT DETECTED.  The SARS-CoV-2 RNA is generally detectable in upper respiratory specimens during the acute phase of infection. The lowest concentration of SARS-CoV-2 viral copies this assay can detect is 138 copies/mL. A negative result does not preclude SARS-Cov-2 infection and should not be used as the sole basis for treatment or other patient management decisions. A negative result may occur with  improper specimen collection/handling, submission of specimen other than nasopharyngeal swab, presence of viral mutation(s) within the areas targeted by this assay, and inadequate number of viral copies(<138 copies/mL). A negative result must be combined with clinical observations, patient history, and epidemiological information. The expected result is Negative.  Fact Sheet for Patients:  BloggerCourse.com  Fact Sheet for Healthcare Providers:  SeriousBroker.it  This test is no t yet approved or cleared by the [place] FDA and  has been authorized for detection and/or diagnosis of SARS-CoV-2 by FDA under an Emergency Use Authorization (EUA). This EUA will remain  in effect (meaning this test can be used) for the duration of  the COVID-19 declaration under Section 564(b)(1) of the Act, 21 U.S.C.section 360bbb-3(b)(1), unless the authorization is terminated  or revoked sooner.       Influenza A by PCR NEGATIVE NEGATIVE Final   Influenza B by PCR NEGATIVE NEGATIVE Final    Comment: (NOTE) The Xpert Xpress SARS-CoV-2/FLU/RSV plus assay is intended as an aid  in the diagnosis of influenza from Nasopharyngeal swab specimens and should not be used as a sole basis for treatment. Nasal washings and aspirates are unacceptable for Xpert Xpress SARS-CoV-2/FLU/RSV testing.  Fact Sheet for Patients: BloggerCourse.com  Fact Sheet for Healthcare Providers: SeriousBroker.it  This test is not yet approved or cleared by the [place] FDA and has been authorized for detection and/or diagnosis of SARS-CoV-2 by FDA under an Emergency Use Authorization (EUA). This EUA will remain in effect (meaning this test can be used) for the duration of the COVID-19 declaration under Section 564(b)(1) of the Act, 21 U.S.C. section 360bbb-3(b)(1), unless the authorization is terminated or revoked.     Resp Syncytial Virus by PCR NEGATIVE NEGATIVE Final    Comment: (NOTE) Fact Sheet for Patients: BloggerCourse.com  Fact Sheet for Healthcare Providers: SeriousBroker.it  This test is not yet approved or cleared by the [place] FDA and has been authorized for detection and/or diagnosis of SARS-CoV-2 by FDA under an Emergency Use Authorization (EUA). This EUA will remain in effect (meaning this test can be used) for the duration of the COVID-19 declaration under Section 564(b)(1) of the Act, 21 U.S.C. section 360bbb-3(b)(1), unless the authorization is terminated or revoked.  Performed at [facility], [address] [place], [place] [address]   Respiratory (~20 pathogens) panel by PCR     Status: None    Collection Time: 06/20/23  9:28 AM   Specimen: Nasopharyngeal Swab; Respiratory  Result Value Ref Range Status   Adenovirus NOT DETECTED NOT DETECTED Final   Coronavirus 229E NOT DETECTED NOT DETECTED Final    Comment: (NOTE) The Coronavirus on the Respiratory Panel, DOES NOT test for the novel  Coronavirus (2019 nCoV)    Coronavirus HKU1 NOT DETECTED NOT DETECTED Final   Coronavirus NL63 NOT DETECTED NOT DETECTED Final   Coronavirus OC43 NOT DETECTED NOT DETECTED Final   Metapneumovirus NOT DETECTED NOT DETECTED Final   Rhinovirus / Enterovirus NOT DETECTED NOT DETECTED Final   Influenza A NOT DETECTED NOT DETECTED Final   Influenza B NOT DETECTED NOT DETECTED Final   Parainfluenza Virus 1 NOT DETECTED NOT DETECTED Final   Parainfluenza Virus 2 NOT DETECTED NOT DETECTED Final   Parainfluenza Virus 3 NOT DETECTED NOT DETECTED Final   Parainfluenza Virus 4 NOT DETECTED NOT DETECTED Final   Respiratory Syncytial Virus NOT DETECTED NOT DETECTED Final   Bordetella pertussis NOT DETECTED NOT DETECTED Final   Bordetella Parapertussis NOT DETECTED NOT DETECTED Final   Chlamydophila pneumoniae NOT DETECTED NOT DETECTED Final   Mycoplasma pneumoniae NOT DETECTED NOT DETECTED Final    Comment: Performed at [facility], [address], [place], [place] [address]  MRSA Next Gen by PCR, Nasal     Status: None   Collection Time: 06/20/23  9:28 AM   Specimen: Nasopharyngeal Swab; Nasal Swab  Result Value Ref Range Status   MRSA by PCR Next Gen NOT DETECTED NOT DETECTED Final    Comment: (NOTE) The GeneXpert MRSA Assay (FDA approved for NASAL specimens only), is one component of a comprehensive MRSA colonization surveillance program. It is not intended to diagnose MRSA infection nor to guide or monitor treatment for MRSA infections. Test performance is not FDA approved in patients less than 74 years old. Performed at [facility], [address], [place], [place] [address]           Radiology Studies: US Abdomen Limited RUQ (LIVER/GB) Result Date: 06/21/2023 CLINICAL DATA:  Transaminitis EXAM:  ULTRASOUND ABDOMEN LIMITED RIGHT UPPER QUADRANT COMPARISON:  CT 06/20/2023 FINDINGS: Gallbladder: No gallstones or wall thickening visualized. No sonographic Murphy sign noted by sonographer. Trace pericholecystic fluid. Common bile duct: Diameter: 3 mm Liver: No focal lesion identified. Within normal limits in parenchymal echogenicity. Portal vein is patent on color Doppler imaging with normal direction of blood flow towards the liver. Other: None. IMPRESSION: Negative for gallstones or biliary dilatation. Trace pericholecystic fluid but no other suspicious sonographic features to suggest cholecystitis. Electronically Signed   By: Jasmine Pang M.D.   On: 06/21/2023 15:47   ECHOCARDIOGRAM COMPLETE Result Date: 06/21/2023    ECHOCARDIOGRAM REPORT   Patient Name:   JAZZLENE EDGINGTON Date of Exam: 06/21/2023 Medical Rec #:  [id number]             Height:       51.0 in Accession #:    [id number]            Weight:       101.4 lb Date of Birth:  12/26/1940            BSA:          1.243 m Patient Age:    82 years              BP:           144/72 mmHg Patient Gender: F                     HR:           106 bpm. Exam Location:  [facility] Procedure: 2D Echo, Cardiac Doppler and Color Doppler Indications:     CHF  History:         Patient has no prior history of Echocardiogram examinations.                  CHF; Risk Factors:Hypertension, Diabetes and Dyslipidemia.                  Breast CA.  Sonographer:     Mikki Harbor Referring Phys:  [id number] BRENDA MORRISON Diagnosing Phys: Debbe Odea MD  Sonographer Comments: Image acquisition challenging due to respiratory motion. IMPRESSIONS  1. Left ventricular ejection fraction, by estimation, is 60 to 65%. The left ventricle has normal function. The left ventricle has no regional wall motion abnormalities. Left ventricular diastolic  parameters are indeterminate.  2. Right ventricular systolic function is mildly reduced. The right ventricular size is mildly enlarged. There is moderately elevated pulmonary artery systolic pressure. The estimated right ventricular systolic pressure is 50.0 mmHg.  3. The mitral valve is normal in structure. Mild mitral valve regurgitation.  4. Tricuspid valve regurgitation is moderate to severe.  5. The aortic valve is tricuspid. Aortic valve regurgitation is mild. Aortic valve sclerosis is present, with no evidence of aortic valve stenosis.  6. The inferior vena cava is normal in size with <50% respiratory variability, suggesting right atrial pressure of 8 mmHg. FINDINGS  Left Ventricle: Left ventricular ejection fraction, by estimation, is 60 to 65%. The left ventricle has normal function. The left ventricle has no regional wall motion abnormalities. The left ventricular internal cavity size was normal in size. There is  no left ventricular hypertrophy. Left ventricular diastolic parameters are indeterminate. Right Ventricle: The right ventricular size is mildly enlarged. No increase in right ventricular wall thickness. Right ventricular systolic function is mildly reduced. There is moderately elevated pulmonary artery systolic pressure. The tricuspid regurgitant velocity  is 3.24 m/s, and with an assumed right atrial pressure of 8 mmHg, the estimated right ventricular systolic pressure is 50.0 mmHg. Left Atrium: Left atrial size was normal in size. Right Atrium: Right atrial size was normal in size. Pericardium: There is no evidence of pericardial effusion. Mitral Valve: The mitral valve is normal in structure. Mild mitral valve regurgitation. MV peak gradient, 7.4 mmHg. The mean mitral valve gradient is 3.0 mmHg. Tricuspid Valve: The tricuspid valve is normal in structure. Tricuspid valve regurgitation is moderate to severe. Aortic Valve: The aortic valve is tricuspid. Aortic valve regurgitation is mild. Aortic  regurgitation PHT measures 308 msec. Aortic valve sclerosis is present, with no evidence of aortic valve stenosis. Aortic valve mean gradient measures 4.0 mmHg. Aortic valve peak gradient measures 8.2 mmHg. Aortic valve area, by VTI measures 1.59 cm. Pulmonic Valve: The pulmonic valve was normal in structure. Pulmonic valve regurgitation is trivial. Aorta: The aortic root and ascending aorta are structurally normal, with no evidence of dilitation. Venous: The inferior vena cava is normal in size with less than 50% respiratory variability, suggesting right atrial pressure of 8 mmHg. IAS/Shunts: No atrial level shunt detected by color flow Doppler.  LEFT VENTRICLE PLAX 2D LVIDd:         2.90 cm LVIDs:         1.70 cm LV PW:         0.90 cm LV IVS:        1.00 cm LVOT diam:     1.60 cm LV SV:         47 LV SV Index:   38 LVOT Area:     2.01 cm  RIGHT VENTRICLE RV Basal diam:  3.30 cm RV Mid diam:    3.70 cm RV S prime:     8.38 cm/s LEFT ATRIUM             Index        RIGHT ATRIUM           Index LA diam:        3.20 cm 2.57 cm/m   RA Area:     15.70 cm LA Vol (A2C):   42.7 ml 34.36 ml/m  RA Volume:   39.00 ml  31.38 ml/m LA Vol (A4C):   20.2 ml 16.25 ml/m LA Biplane Vol: 30.7 ml 24.70 ml/m  AORTIC VALVE                    PULMONIC VALVE AV Area (Vmax):    1.55 cm     PV Vmax:       0.81 m/s AV Area (Vmean):   1.38 cm     PV Peak grad:  2.6 mmHg AV Area (VTI):     1.59 cm AV Vmax:           143.00 cm/s AV Vmean:          99.500 cm/s AV VTI:            0.298 m AV Peak Grad:      8.2 mmHg AV Mean Grad:      4.0 mmHg LVOT Vmax:         110.00 cm/s LVOT Vmean:        68.300 cm/s LVOT VTI:          0.235 m LVOT/AV VTI ratio: 0.79 AI PHT:            308 msec  AORTA Ao Root diam:  2.90 cm Ao Asc diam:  3.20 cm MITRAL VALVE                TRICUSPID VALVE MV Area (PHT): 5.46 cm     TR Peak grad:   42.0 mmHg MV Area VTI:   2.67 cm     TR Vmax:        324.00 cm/s MV Peak grad:  7.4 mmHg MV Mean grad:  3.0 mmHg      SHUNTS MV Vmax:       1.36 m/s     Systemic VTI:  0.24 m MV Vmean:      75.2 cm/s    Systemic Diam: 1.60 cm MV Decel Time: 139 msec MV E velocity: 124.00 cm/s Debbe Odea MD Electronically signed by Debbe Odea MD Signature Date/Time: 06/21/2023/12:56:20 PM    Final    CT Angio Abd/Pel w/ and/or w/o Result Date: 06/20/2023 CLINICAL DATA:  Mesenteric ischemia. History of breast cancer diabetes and hypertension. EXAM: CTA ABDOMEN AND PELVIS WITHOUT AND WITH CONTRAST TECHNIQUE: Multidetector CT imaging of the abdomen and pelvis was performed using the standard protocol during bolus administration of intravenous contrast. Multiplanar reconstructed images and MIPs were obtained and reviewed to evaluate the vascular anatomy. RADIATION DOSE REDUCTION: This exam was performed according to the departmental dose-optimization program which includes automated exposure control, adjustment of the mA and/or kV according to patient size and/or use of iterative reconstruction technique. CONTRAST:  OMNIPAQUE IOHEXOL 350 MG/ML SOLN COMPARISON:  Chest CT dated 06/20/2023 and CT abdomen pelvis dated 02/06/2018. FINDINGS: VASCULAR Aorta: Mild atherosclerotic calcification. No aneurysmal dilatation or dissection. No periaortic fluid collection. Celiac: Patent without evidence of aneurysm, dissection, vasculitis or significant stenosis. SMA: Patent without evidence of aneurysm, dissection, vasculitis or significant stenosis. Renals: Atherosclerotic calcification of the origins of the renal arteries. The renal arteries are otherwise patent. IMA: The IMA is patent. Inflow: Mild atherosclerotic calcification of the iliac arteries. The iliac arteries are patent. No aneurysmal dilatation or dissection. Proximal Outflow: The visualized proximal of fluid is patent. Veins: The IVC is unremarkable. The SMV, splenic vein, and main portal vein are patent. No portal venous gas. Review of the MIP images confirms the above  findings. NON-VASCULAR Lower chest: See chest CT report. No intra-abdominal free air or free fluid. Hepatobiliary: The liver is unremarkable. There is trace perihepatic fluid. No calcified gallstone. Ultrasound may provide better evaluation if there is clinical concern for for acute gallbladder pathology. Pancreas: Unremarkable. No pancreatic ductal dilatation or surrounding inflammatory changes. Spleen: Normal in size without focal abnormality. Adrenals/Urinary Tract: The adrenal glands are unremarkable. There is a 4.5 cm right renal upper pole cyst. There is no hydronephrosis on either side. The visualized ureters and urinary bladder appear unremarkable. Stomach/Bowel: Small hiatal hernia. There is no bowel obstruction or active inflammation. The appendix is normal. Lymphatic: No adenopathy. Reproductive: The uterus is grossly unremarkable. No adnexal masses. Other: None Musculoskeletal: Bilateral L5 pars defects with grade 1 anterolisthesis. 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 No acute osseous pathology. Degenerative changes. IMPRESSION: 1. No acute intra-abdominal or pelvic pathology. No CT evidence of mesenteric ischemia. 2. Small hiatal hernia. No bowel obstruction. Normal appendix. Electronically Signed   By: Elgie Collard M.D.   On: 06/20/2023 20:49   CT CHEST WO CONTRAST Result Date: 06/20/2023 CLINICAL DATA:  Respiratory failure.  Interstitial lung disease EXAM: CT CHEST WITHOUT CONTRAST TECHNIQUE: Multidetector CT imaging of the  chest was performed following the standard protocol without IV contrast. RADIATION DOSE REDUCTION: This exam was performed according to the departmental dose-optimization program which includes automated exposure control, adjustment of the mA and/or kV according to patient size and/or use of iterative reconstruction technique. COMPARISON:  X-ray 06/19/2023, CT 03/12/2023 FINDINGS: Cardiovascular: Mild  cardiomegaly. No pericardial effusion. Relative hypoattenuation of the cardiac blood pool indicative of anemia. Thoracic aorta is nonaneurysmal. Scattered atherosclerotic vascular calcifications of the aorta and coronary arteries. Central pulmonary vasculature is within normal limits. Mediastinum/Nodes: No lymphadenopathy is seen. Tiny hiatal hernia. Thyroid gland and trachea within normal limits. Lungs/Pleura: Extensive airspace consolidations throughout both lungs. 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 Trace bilateral pleural effusions. No pneumothorax. Upper Abdomen: No acute abnormality. Musculoskeletal: Postsurgical changes to the anterior chest wall from bilateral mastectomies with a similar degree of postoperative scarring. No acute osseous abnormality or suspicious bone lesions identified. IMPRESSION: 1. Extensive airspace consolidations throughout both lungs, most compatible with multifocal pneumonia. 2. Background of pulmonary fibrosis. 3. Trace bilateral pleural effusions. 4. Mild cardiomegaly. 5. Aortic and coronary artery atherosclerosis (ICD10-I70.0). Electronically Signed   By: Duanne Guess D.O.   On: 06/20/2023 12:13        Scheduled Meds:  budesonide  0.5 mg Nebulization BID   Chlorhexidine Gluconate Cloth  6 each Topical Daily   enoxaparin (LOVENOX) injection  30 mg Subcutaneous Q24H   guaiFENesin  600 mg Oral BID   insulin aspart  0-15 Units Subcutaneous TID AC & HS   ipratropium-albuterol  3 mL Nebulization QID   levothyroxine  25 mcg Oral Q0600   loratadine  10 mg Oral Daily   methylPREDNISolone (SOLU-MEDROL) injection  60 mg Intravenous Q12H   pantoprazole  40 mg Oral Daily   sertraline  25 mg Oral Daily   Continuous Infusions:  azithromycin Stopped (06/21/23 2211)   piperacillin-tazobactam (ZOSYN)  IV 12.5 mL/hr at 06/22/23 0700     LOS: 3 days     Charise Killian, MD [facility] Pager [phone]  If 7PM-7AM, please  contact night-coverage [email or web address] 06/22/2023, 11:02 AM

## 2023-06-22 NOTE — Plan of Care (Signed)
  Problem: Education: Goal: Knowledge of General Education information will improve Description Including pain rating scale, medication(s)/side effects and non-pharmacologic comfort measures Outcome: Progressing   

## 2023-06-23 ENCOUNTER — Inpatient Hospital Stay: Payer: Medicaid Other

## 2023-06-23 DIAGNOSIS — E1165 Type 2 diabetes mellitus with hyperglycemia: Secondary | ICD-10-CM | POA: Insufficient documentation

## 2023-06-23 DIAGNOSIS — E871 Hypo-osmolality and hyponatremia: Secondary | ICD-10-CM | POA: Diagnosis not present

## 2023-06-23 DIAGNOSIS — R6520 Severe sepsis without septic shock: Secondary | ICD-10-CM

## 2023-06-23 DIAGNOSIS — E785 Hyperlipidemia, unspecified: Secondary | ICD-10-CM

## 2023-06-23 DIAGNOSIS — I10 Essential (primary) hypertension: Secondary | ICD-10-CM | POA: Diagnosis not present

## 2023-06-23 DIAGNOSIS — I5A Non-ischemic myocardial injury (non-traumatic): Secondary | ICD-10-CM

## 2023-06-23 DIAGNOSIS — K219 Gastro-esophageal reflux disease without esophagitis: Secondary | ICD-10-CM

## 2023-06-23 DIAGNOSIS — A419 Sepsis, unspecified organism: Secondary | ICD-10-CM | POA: Diagnosis not present

## 2023-06-23 DIAGNOSIS — J849 Interstitial pulmonary disease, unspecified: Secondary | ICD-10-CM | POA: Diagnosis not present

## 2023-06-23 DIAGNOSIS — Z853 Personal history of malignant neoplasm of breast: Secondary | ICD-10-CM

## 2023-06-23 DIAGNOSIS — F3289 Other specified depressive episodes: Secondary | ICD-10-CM

## 2023-06-23 DIAGNOSIS — J189 Pneumonia, unspecified organism: Secondary | ICD-10-CM

## 2023-06-23 DIAGNOSIS — K5909 Other constipation: Secondary | ICD-10-CM

## 2023-06-23 DIAGNOSIS — K5900 Constipation, unspecified: Secondary | ICD-10-CM | POA: Insufficient documentation

## 2023-06-23 LAB — COMPREHENSIVE METABOLIC PANEL
ALT: 144 U/L — ABNORMAL HIGH (ref 0–44)
AST: 84 U/L — ABNORMAL HIGH (ref 15–41)
Albumin: 2.6 g/dL — ABNORMAL LOW (ref 3.5–5.0)
Alkaline Phosphatase: 135 U/L — ABNORMAL HIGH (ref 38–126)
Anion gap: 9 (ref 5–15)
BUN: 19 mg/dL (ref 8–23)
CO2: 29 mmol/L (ref 22–32)
Calcium: 8.4 mg/dL — ABNORMAL LOW (ref 8.9–10.3)
Chloride: 99 mmol/L (ref 98–111)
Creatinine, Ser: 0.82 mg/dL (ref 0.44–1.00)
GFR, Estimated: >60 mL/min (ref >60)
Glucose, Bld: 121 mg/dL — ABNORMAL HIGH (ref 70–99)
Potassium: 3.6 mmol/L (ref 3.5–5.1)
Sodium: 137 mmol/L (ref 135–145)
Total Bilirubin: 0.6 mg/dL (ref <1.2)
Total Protein: 6.8 g/dL (ref 6.5–8.1)

## 2023-06-23 LAB — CBC
HCT: 22.8 % — ABNORMAL LOW (ref 36.0–46.0)
Hemoglobin: 7.5 g/dL — ABNORMAL LOW (ref 12.0–15.0)
MCH: 24.1 pg — ABNORMAL LOW (ref 26.0–34.0)
MCHC: 32.9 g/dL (ref 30.0–36.0)
MCV: 73.3 fL — ABNORMAL LOW (ref 80.0–100.0)
Platelets: 429 10*3/uL — ABNORMAL HIGH (ref 150–400)
RBC: 3.11 MIL/uL — ABNORMAL LOW (ref 3.87–5.11)
RDW: 15.7 % — ABNORMAL HIGH (ref 11.5–15.5)
WBC: 16.4 10*3/uL — ABNORMAL HIGH (ref 4.0–10.5)
nRBC: 0.4 % — ABNORMAL HIGH (ref 0.0–0.2)

## 2023-06-23 LAB — GLUCOSE, CAPILLARY
Glucose-Capillary: 161 mg/dL — ABNORMAL HIGH (ref 70–99)
Glucose-Capillary: 172 mg/dL — ABNORMAL HIGH (ref 70–99)
Glucose-Capillary: 183 mg/dL — ABNORMAL HIGH (ref 70–99)
Glucose-Capillary: 181 mg/dL — ABNORMAL HIGH (ref 70–99)
Glucose-Capillary: 175 mg/dL — ABNORMAL HIGH (ref 70–99)

## 2023-06-23 MED ORDER — FUROSEMIDE 10 MG/ML IJ SOLN
20.0000 mg | Freq: Once | INTRAMUSCULAR | Status: AC
  Administered 2023-06-23: 20 mg via INTRAVENOUS
  Filled 2023-06-23: qty 2

## 2023-06-23 MED ORDER — METOPROLOL TARTRATE 5 MG/5ML IV SOLN
2.5000 mg | INTRAVENOUS | Status: DC | PRN
  Administered 2023-06-23 – 2023-06-24 (×3): 2.5 mg via INTRAVENOUS
  Filled 2023-06-23 (×5): qty 5

## 2023-06-23 MED ORDER — ORAL CARE MOUTH RINSE: 15.0000 mL | OROMUCOSAL | Status: DC | PRN

## 2023-06-23 MED ORDER — MORPHINE SULFATE (PF) 2 MG/ML IV SOLN
1.0000 mg | INTRAVENOUS | Status: DC | PRN
  Administered 2023-06-23 – 2023-06-25 (×2): 1 mg via INTRAVENOUS
  Filled 2023-06-23 (×2): qty 1

## 2023-06-23 MED ORDER — BISACODYL 5 MG PO TBEC: 5.0000 mg | DELAYED_RELEASE_TABLET | Freq: Once | ORAL | Status: DC

## 2023-06-23 MED ORDER — DOCUSATE SODIUM 100 MG PO CAPS
100.0000 mg | ORAL_CAPSULE | Freq: Two times a day (BID) | ORAL | Status: DC
  Administered 2023-06-23 – 2023-06-25 (×4): 100 mg via ORAL
  Filled 2023-06-23 (×4): qty 1

## 2023-06-23 NOTE — Progress Notes (Signed)
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   Patient presents today with symptoms of cough and weakness. Symptoms worsened around Friday of last week. Prior to that, she and her son report that she was in her usual state of health. They deny sore throat, fever, chills, night sweats, or weight changes. They report no sick contacts, and no body in the family is sick with any viral illness. She denies any chest pain or chest tightness.   Patient is followed by me in clinic for ILD. I last saw her on 04/08/2023. 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 Opted out of surgical biopsy given age following shared decision making discussion with patient and family. We did empirically treate with steroids which showed significant improvement in symptoms and radiographic improvement on repeat chest CT in September of 2024 following discontinuation of steroid taper.   Patient is originally from [place] where she was born and lived up until 6 years ago.  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  Patient reports using wood sticks for fuel for around 20 years of her life, after which she switched to coal briefly and then kerosene/diesel. She moved in with her son 6 years ago in [place].  They have lived in the same house (new build).  There is no basement, no water damage, and no mold in the house. No sick contacts reported  Pertinent  Medical History   Patient's past medical history includes breast cancer status post bilateral mastectomy and chemoradiation. She also has a  history of depression and anxiety  Interim History / Subjective:   Continues to feel short of breath. Son and daughter in law at the bedside. No chest pain.  Objective   Blood pressure (!) 144/73, pulse (!) 112, temperature (!) 97.5 F (36.4 C), temperature source Oral, resp. rate (!) 25, height 4\' 3"  (1.295 m), weight 46 kg, SpO2 94%.    FiO2 (%):  [65 %-80 %] 80 %   Intake/Output Summary (Last 24 hours) at 06/23/2023 1531 Last data filed at 06/23/2023 0616 Gross per 24 hour  Intake 605.37 ml  Output 775 ml  Net -169.63 ml   Filed Weights   06/19/23 1803  Weight: 46 kg    Examination: Physical Exam Constitutional:      General: She is not in acute distress.    Appearance: She is ill-appearing.  Cardiovascular:     Rate and Rhythm: Normal rate and regular rhythm.  Pulmonary:     Effort: Respiratory distress present.     Breath sounds: Rales present. No wheezing.     Comments: On hi-flo nasal cannula Neurological:     General: No focal deficit present.     Mental Status: She is alert. Mental status is at baseline.     Motor: Weakness present.     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 She presents with  a few days history of increased shortness of breath and hypoxia. Repeat chest CT yesterday showed underlying ILD with super imposed ground glass opacities and consolidative opacities concerning for infection vs. ILD flare.  Infectious workup has so far been negative, with beta-d-glucan and aspergillus galactomannan pending. It's possible she's had an infectious process vs a flare of her ILD. It is either that the underlying process has re-surged (also possible that sertraline is a culprit as was restarted on October of 2024). She did finish her steroids in September (alongside  bactrim for PJP prophylaxis) with radiographic improvement on repeat CT. She is currently on antibiotics and steroids, with worsening in her condition over the past 24 hours. RV dysfunction noted on TTE could be secondary to pulmonary hypertension (PH-ILD) or hypoxic vasoconstriction secondary with increased RV afterload. Given this, we will initiate diuresis with IV furosemide. Should hemodynamics remain intact, will redose again today with goal at least -1L total I/O balance.  Son and daughter in law updated at the bedside, grandson updated over the phone at the as well.   -O2 supplementation, goal SpO2 > 92% -continue steroids -check beta-D-glucan, aspergillus galactomannan -respiratory cultures -continue CAP coverage (Ceftriaxone and Azithromycin) -IV Furosemide -we will continue to follow  [name], MD [facility] 06/23/2023 5:35 PM   Labs   CBC: Recent Labs  Lab 06/20/23 0928 06/20/23 2326 06/21/23 0546 06/22/23 0358 06/23/23 0420  WBC 14.8* 13.6* 16.7* 16.5* 16.4*  NEUTROABS 11.6*  --  14.0*  --   --   HGB 7.5* 6.9* 7.7* 7.7* 7.5*  HCT 24.1* 21.1* 23.4* 23.3* 22.8*  MCV 77.5* 75.9* 75.5* 72.6* 73.3*  PLT 431* 334 406* 450* 429*    Basic Metabolic Panel: Recent Labs  Lab 06/20/23 1912 06/20/23 2326 06/21/23 0546 06/22/23 0358 06/23/23 0420  NA 125* 132* 136 136 137  K 4.2 3.7 3.4* 3.6 3.6  CL 96* 100 100 99 99  CO2 19* 24 27 27 29   GLUCOSE 272* 194* 147* 151* 121*  BUN 18 19 18 18 19   CREATININE 0.93 0.92 0.98 0.91 0.82  CALCIUM 8.3* 7.9* 8.2* 8.3* 8.4*  MG  --  1.7 1.7  --   --   PHOS  --  2.7  --   --   --    GFR: Estimated Creatinine Clearance: 27.3 mL/min (by C-G formula based on SCr of 0.82 mg/dL). Recent Labs  Lab 06/20/23 0510 06/20/23 0928 06/20/23 1912 06/20/23 2127 06/20/23 2325 06/20/23 2326 06/21/23 0546 06/22/23 0358 06/23/23 0420  PROCALCITON 0.32  --   --   --   --   --   --   --   --   WBC 12.4* 14.8*  --    --   --  13.6* 16.7* 16.5* 16.4*  LATICACIDVEN  --  1.6 3.2* 1.5 1.5  --   --   --   --     Liver Function Tests: Recent Labs  Lab 06/19/23 1918 06/20/23 1912 06/20/23 2326 06/21/23 0546 06/23/23 0420  AST 89* 144* 168* 173* 84*  ALT 53* 117* 130* 139* 144*  ALKPHOS 96 167* 148* 150* 135*  BILITOT 0.5 0.9 0.5 0.3 0.6  PROT 7.5 6.6 5.7* 6.1* 6.8  ALBUMIN 3.0* 2.7* 2.3* 2.6* 2.6*   No results for input(s): "LIPASE", "AMYLASE" in the last 168 hours. No results for input(s): "AMMONIA" in the last 168 hours.  ABG    Component Value Date/Time   PHART 7.3 (L) 06/20/2023 1829  PCO2ART 30 (L) 06/20/2023 1829   PO2ART 69 (L) 06/20/2023 1829   HCO3 27.8 06/21/2023 0546   ACIDBASEDEF 10.3 (H) 06/20/2023 1829   O2SAT 86.9 06/21/2023 0546     Coagulation Profile: Recent Labs  Lab 06/19/23 1918 06/20/23 0510  INR 1.1 1.1    Cardiac Enzymes: No results for input(s): "CKTOTAL", "CKMB", "CKMBINDEX", "TROPONINI" in the last 168 hours.  HbA1C: Hgb A1c MFr Bld  Date/Time Value Ref Range Status  02/18/2023 03:43 PM 6.1 (H) 4.8 - 5.6 % Final    Comment:             Prediabetes: 5.7 - 6.4          Diabetes: >6.4          Glycemic control for adults with diabetes: <7.0   08/27/2022 04:35 PM 6.6 (H) 4.8 - 5.6 % Final    Comment:             Prediabetes: 5.7 - 6.4          Diabetes: >6.4          Glycemic control for adults with diabetes: <7.0     CBG: Recent Labs  Lab 06/22/23 1259 06/22/23 1656 06/22/23 2105 06/23/23 0900 06/23/23 1151  GLUCAP 201* 224* 265* 161* 172*    Past Medical History:  She,  has a past medical history of Breast cancer (HCC), Cataract, Diabetes mellitus without complication (HCC), Hyperlipidemia, Hypertension, and Thyroid disease.   Surgical History:   Past Surgical History:  Procedure Laterality Date   BREAST SURGERY     EYE SURGERY       Social History:   reports that she has never smoked. She has never used smokeless tobacco.  She reports that she does not drink alcohol and does not use drugs.   Family History:  Her family history includes Healthy in her brother, daughter, daughter, daughter, sister, and sister.   Allergies No Known Allergies   Home Medications  Prior to Admission medications   Medication Sig Start Date End Date Taking? Authorizing Provider  alendronate (FOSAMAX) 70 MG tablet Take 1 tablet (70 mg total) by mouth every 7 (seven) days. Take with a full glass of water on an empty stomach. 02/05/23  Yes Simmons-Robinson, Makiera, MD  atorvastatin (LIPITOR) 20 MG tablet Take 1 tablet (20 mg total) by mouth daily. 08/27/22  Yes Bacigalupo, Marzella Schlein, MD  budesonide (PULMICORT) 0.5 MG/2ML nebulizer solution Take 2 mLs (0.5 mg total) by nebulization in the morning and at bedtime. 06/18/23 06/17/24 Yes Foxx Klarich, Lianne Bushy, MD  Calcium Carbonate-Vitamin D 600-400 MG-UNIT tablet Take 1 tablet by mouth daily.    Yes [provider]  famotidine (PEPCID) 20 MG tablet Take 1 tablet (20 mg total) by mouth 2 (two) times daily. 01/01/23 01/01/24 Yes Bacigalupo, Marzella Schlein, MD  loratadine (CLARITIN) 10 MG tablet Take 1 tablet (10 mg total) by mouth daily. 04/08/23 04/07/24 Yes Tristen Pennino, Lianne Bushy, MD  metFORMIN (GLUCOPHAGE) 500 MG tablet Take 1 tablet (500 mg total) by mouth 2 (two) times daily with a meal. 03/14/23  Yes Bacigalupo, Marzella Schlein, MD  omeprazole (PRILOSEC) 20 MG capsule Take 1 capsule (20 mg total) by mouth daily. 06/05/23  Yes Jacky Kindle, FNP  propranolol (INDERAL) 20 MG tablet Take 1 tablet (20 mg total) by mouth 2 (two) times daily. 04/22/23 10/19/23 Yes Bacigalupo, Marzella Schlein, MD  sertraline (ZOLOFT) 25 MG tablet Take 1 tablet (25 mg total) by mouth daily. 04/19/23  Yes Shirlee Latch  M, MD    I spent 35 minutes caring for this patient today, including preparing to see the patient, obtaining a medical history , reviewing a separately obtained history, performing a medically appropriate examination and/or  evaluation, counseling and educating the patient/family/caregiver, ordering medications, tests, or procedures, and documenting clinical information in the electronic health record

## 2023-06-23 NOTE — Progress Notes (Signed)
Patient having increased work of breathing. Desaturated to 70s when moved. RT placed HHFNC. MD aware.

## 2023-06-23 NOTE — Progress Notes (Signed)
PT Cancellation Note  Patient Details Name: Erin Wagner MRN: [id number] DOB: [birth year]   Cancelled Treatment:    Reason Eval/Treat Not Completed: Medical issues which prohibited therapy. Pt with inability to maintain oxygen saturation with bed positional changes. Not medically ready for PT. Will continue to follow up.    Mersadies Petree A Ellaina Schuler 06/23/2023, 2:34 PM

## 2023-06-23 NOTE — Plan of Care (Signed)
  Problem: Education: Goal: Knowledge of General Education information will improve Description Including pain rating scale, medication(s)/side effects and non-pharmacologic comfort measures Outcome: Progressing   

## 2023-06-23 NOTE — Progress Notes (Signed)
Report received at 1820 from [facility].  Patient arrived to unit at approximately 1830, patient transferred to ICU bed and placed on cardiac monitoring, patient is currently on heated high flow nasal cannula 50L 80%.  Unable to place sacral foam due inability of proper adherence.  Patient's family at bedside, will continue to monitor patient.

## 2023-06-23 NOTE — Hospital Course (Addendum)
82 year old female past medical history of breast cancer in remission, hypertension, hyperlipidemia, interstitial lung disease, hypothyroidism presented with acute onset cough and shortness of breath over the last 7 days.  She was on a recent steroid taper.  In the ER she was found to have hyponatremia, hyperkalemia, elevated liver function test, leukocytosis.  CT scan shows multifocal pneumonia with chronic interstitial lung disease.  12/15.  This morning was on 13 L high flow nasal cannula and progressed to heated high flow nasal cannula.  Case discussed with critical care specialist. One dose of lasix given. 12/16.  This morning patient was on heated high flow Nasal cannula 96% oxygen and +100% nonrebreather.  CT angiogram of the chest was negative for pulmonary embolism, background chronic interstitial lung disease and fibrosis and widespread groundglass density throughout the lungs 12/17.  Patient short of breath on 100% heated high flow nasal cannula and 100% nonrebreather.  As per Dr. Mayford Knife 12/18-12/22: Unfortunately, pt's respiratory status has not made much improvement. On IV levaquin, IV steroids, bronchodilators and pt still struggles to breath. Pt is waiting on her granddaughter to arrive from out of town tomorrow and will likely switch over to comfort care tomorrow.  2023/07/11.  Patient converted to comfort care measures and started on morphine drip.  Respirations slowed down once morphine drip started.  Patient passed away on July 11, 2023 at 1629.

## 2023-06-23 NOTE — Assessment & Plan Note (Addendum)
Sliding scale discontinued

## 2023-06-23 NOTE — Assessment & Plan Note (Addendum)
Patient required heated high flow nasal cannula at 77 to 82% oxygen today.

## 2023-06-23 NOTE — Assessment & Plan Note (Addendum)
Present on admission.  Lactic acid 4.7.  Secondary to multifocal pneumonia, tachypnea and leukocytosis.  Patient also has acute respiratory failure.  Patient completed antibiotics and steroids discontinued

## 2023-06-23 NOTE — Assessment & Plan Note (Signed)
Secondary to sepsis. °

## 2023-06-23 NOTE — Progress Notes (Signed)
Progress Note   Patient: Erin Wagner YQM:[id number] DOB: 09-Nov-1940 DOA: 06/19/2023     4 DOS: the patient was seen and examined on 06/23/2023   Brief hospital course: 82 year old female past medical history of breast cancer in remission, hypertension, hyperlipidemia, interstitial lung disease, hypothyroidism presented with acute onset cough and shortness of breath over the last 7 days.  She was on a recent steroid taper.  In the ER she was found to have hyponatremia, hyperkalemia, elevated liver function test, leukocytosis.  CT scan shows multifocal pneumonia with chronic interstitial lung disease.  12/15.  This morning was on 13 L high flow nasal cannula and progressed to heated high flow nasal cannula.  Case discussed with critical care specialist. One dose of lasix given.  Assessment and Plan: * Acute hypoxemic respiratory failure (HCC) Patient with more therapeutic saturations.  Desaturated to the 70s just with sitting forward.  Patient was on 13 L high flow nasal cannula this morning and needed to go to heated high flow nasal cannula and as per nursing staff is on 80% FiO2 with 50 flow rate.  Continue steroids.  Critical care team following.  Severe sepsis (HCC) Secondary to multifocal pneumonia, tachypnea and leukocytosis.  Patient also has acute respiratory failure.  Patient on Zosyn and Zithromax and steroids.  Interstitial lung disease (HCC) Continue nebulizer treatments and IV Solu-Medrol.  Essential hypertension - We will continue antihypertensive therapy.  Hyponatremia Sodium normalized  Hypothyroidism Continue Synthroid  Myocardial injury Secondary to sepsis  Constipation Dulcolax tablets and Colace.  Uncontrolled type 2 diabetes mellitus with hyperglycemia, without long-term current use of insulin (HCC) Patient's on sliding scale insulin with the high-dose steroids.  GERD without esophagitis On Protonix  Dyslipidemia Holding  atorvastatin  Depression Continue Zoloft  History of breast cancer .        Subjective: Patient feels a little bit better.  Some shortness of breath.  Desaturates with sitting forward for me to listen to her lungs into the 70s.  Admitted with acute respiratory failure and pneumonia.  Physical Exam: Vitals:   06/23/23 0900 06/23/23 1154 06/23/23 1500 06/23/23 1606  BP: 115/74 (!) 144/73  (!) 116/91  Pulse: (!) 107 (!) 112  (!) 112  Resp: (!) 22 (!) 25  17  Temp:  (!) 97.5 F (36.4 C)  98.3 F (36.8 C)  TempSrc: Oral Oral  Oral  SpO2: 90% 90% 94% 96%  Weight:      Height:       Physical Exam HENT:     Head: Normocephalic.     Mouth/Throat:     Pharynx: No oropharyngeal exudate.  Eyes:     General: Lids are normal.     Conjunctiva/sclera: Conjunctivae normal.  Cardiovascular:     Rate and Rhythm: Normal rate and regular rhythm.     Heart sounds: Normal heart sounds, S1 normal and S2 normal.  Pulmonary:     Breath sounds: Examination of the right-middle field reveals decreased breath sounds. Examination of the left-middle field reveals decreased breath sounds. Examination of the right-lower field reveals decreased breath sounds and rhonchi. Examination of the left-lower field reveals decreased breath sounds and rhonchi. Decreased breath sounds and rhonchi present. No wheezing or rales.  Abdominal:     Palpations: Abdomen is soft.     Tenderness: There is no abdominal tenderness.  Musculoskeletal:     Right lower leg: No swelling.     Left lower leg: No swelling.  Skin:    General: Skin  is warm.     Findings: No rash.  Neurological:     Mental Status: She is alert and oriented to person, place, and time.     Data Reviewed: Creatinine 0.82, AST 84, ALT 144, hemoglobin 7.5, white blood cell count 16.4, platelet count 429.  Previous CT scan showing multifocal pneumonia and fibrosis at the bases.  Family Communication: Family at bedside  Disposition: Status is:  Inpatient Remains inpatient appropriate because: Patient was on 13 L high flow nasal cannula when I saw her.  Progressing to heated high flow nasal cannula.  Planned Discharge Destination: To be determined    Time spent: 32 minutes Case discussed with critical care team  Author: Alford Highland, MD 06/23/2023 4:58 PM  For on call review [email or web address].

## 2023-06-23 NOTE — Progress Notes (Signed)
Patient transferred to ICU. Report given to [employer].

## 2023-06-24 ENCOUNTER — Inpatient Hospital Stay: Payer: Medicaid Other

## 2023-06-24 DIAGNOSIS — I272 Pulmonary hypertension, unspecified: Secondary | ICD-10-CM

## 2023-06-24 DIAGNOSIS — I10 Essential (primary) hypertension: Secondary | ICD-10-CM | POA: Diagnosis not present

## 2023-06-24 DIAGNOSIS — J849 Interstitial pulmonary disease, unspecified: Secondary | ICD-10-CM | POA: Diagnosis not present

## 2023-06-24 DIAGNOSIS — D509 Iron deficiency anemia, unspecified: Secondary | ICD-10-CM | POA: Insufficient documentation

## 2023-06-24 DIAGNOSIS — A419 Sepsis, unspecified organism: Secondary | ICD-10-CM | POA: Diagnosis not present

## 2023-06-24 LAB — BASIC METABOLIC PANEL
Anion gap: 10 (ref 5–15)
BUN: 21 mg/dL (ref 8–23)
CO2: 28 mmol/L (ref 22–32)
Calcium: 8.5 mg/dL — ABNORMAL LOW (ref 8.9–10.3)
Chloride: 97 mmol/L — ABNORMAL LOW (ref 98–111)
Creatinine, Ser: 0.9 mg/dL (ref 0.44–1.00)
GFR, Estimated: >60 mL/min (ref >60)
Glucose, Bld: 156 mg/dL — ABNORMAL HIGH (ref 70–99)
Potassium: 4 mmol/L (ref 3.5–5.1)
Sodium: 135 mmol/L (ref 135–145)

## 2023-06-24 LAB — CBC
HCT: 23.9 % — ABNORMAL LOW (ref 36.0–46.0)
Hemoglobin: 7.7 g/dL — ABNORMAL LOW (ref 12.0–15.0)
MCH: 24.3 pg — ABNORMAL LOW (ref 26.0–34.0)
MCHC: 32.2 g/dL (ref 30.0–36.0)
MCV: 75.4 fL — ABNORMAL LOW (ref 80.0–100.0)
Platelets: 436 10*3/uL — ABNORMAL HIGH (ref 150–400)
RBC: 3.17 MIL/uL — ABNORMAL LOW (ref 3.87–5.11)
RDW: 15.9 % — ABNORMAL HIGH (ref 11.5–15.5)
WBC: 16 10*3/uL — ABNORMAL HIGH (ref 4.0–10.5)
nRBC: 0.5 % — ABNORMAL HIGH (ref 0.0–0.2)

## 2023-06-24 LAB — CULTURE, BLOOD (ROUTINE X 2)
Culture: NO GROWTH
Culture: NO GROWTH
Special Requests: ADEQUATE
Special Requests: ADEQUATE

## 2023-06-24 LAB — IRON AND TIBC
Iron: 20 ug/dL — ABNORMAL LOW (ref 28–170)
Saturation Ratios: 7 % — ABNORMAL LOW (ref 10.4–31.8)
TIBC: 309 ug/dL (ref 250–450)
UIBC: 289 ug/dL

## 2023-06-24 LAB — GLUCOSE, CAPILLARY
Glucose-Capillary: 154 mg/dL — ABNORMAL HIGH (ref 70–99)
Glucose-Capillary: 153 mg/dL — ABNORMAL HIGH (ref 70–99)
Glucose-Capillary: 182 mg/dL — ABNORMAL HIGH (ref 70–99)
Glucose-Capillary: 126 mg/dL — ABNORMAL HIGH (ref 70–99)

## 2023-06-24 LAB — CRYPTOCOCCUS ANTIGEN, SERUM: Cryptococcus Antigen, Serum: NEGATIVE

## 2023-06-24 LAB — LEGIONELLA PNEUMOPHILA SEROGP 1 UR AG: L. pneumophila Serogp 1 Ur Ag: NEGATIVE

## 2023-06-24 LAB — STREP PNEUMONIAE URINARY ANTIGEN: Strep Pneumo Urinary Antigen: NEGATIVE

## 2023-06-24 MED ORDER — METHYLPREDNISOLONE SODIUM SUCC 40 MG IJ SOLR
40.0000 mg | Freq: Two times a day (BID) | INTRAMUSCULAR | Status: DC
  Administered 2023-06-24 – 2023-06-26 (×4): 40 mg via INTRAVENOUS
  Filled 2023-06-24 (×4): qty 1

## 2023-06-24 MED ORDER — ASCORBIC ACID 500 MG/5ML PO LIQD
500.0000 mg | Freq: Two times a day (BID) | ORAL | Status: DC
  Administered 2023-06-25 – 2023-06-27 (×5): 500 mg via ORAL
  Filled 2023-06-24 (×6): qty 5

## 2023-06-24 MED ORDER — BISACODYL 5 MG PO TBEC
5.0000 mg | DELAYED_RELEASE_TABLET | Freq: Once | ORAL | Status: AC
  Administered 2023-06-24: 5 mg via ORAL
  Filled 2023-06-24: qty 1

## 2023-06-24 MED ORDER — FUROSEMIDE 10 MG/ML IJ SOLN
20.0000 mg | Freq: Two times a day (BID) | INTRAMUSCULAR | Status: DC
  Administered 2023-06-24: 20 mg via INTRAVENOUS
  Filled 2023-06-24: qty 2

## 2023-06-24 MED ORDER — IOHEXOL 350 MG/ML SOLN
75.0000 mL | Freq: Once | INTRAVENOUS | Status: AC | PRN
  Administered 2023-06-24: 75 mL via INTRAVENOUS

## 2023-06-24 MED ORDER — FERROUS SULFATE 325 (65 FE) MG PO TABS
325.0000 mg | ORAL_TABLET | Freq: Two times a day (BID) | ORAL | Status: DC
  Administered 2023-06-25 – 2023-06-28 (×8): 325 mg via ORAL
  Filled 2023-06-24 (×9): qty 1

## 2023-06-24 NOTE — Progress Notes (Signed)
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    Pertinent  Medical History   Patient's past medical history includes breast cancer status post bilateral mastectomy and chemoradiation. She also has a history of depression and anxiety  Interim History / Subjective:   Did develop increased shortness of breath yesterday she is getting worse and is at high risk of death currently on maximal settings on HFNC 95%/60L/min   Objective   Blood pressure (!) 156/73, pulse (!) 109, temperature 99 F (37.2 C), temperature source Axillary, resp. rate (!) 24, height 4\' 3"  (1.295 m), weight 47.9 kg, SpO2 97%.    FiO2 (%):  [80 %-100 %] 100 %   Intake/Output Summary (Last 24 hours) at 06/24/2023 1741 Last data filed at 06/24/2023 1700 Gross per 24 hour  Intake 601.01 ml  Output 2725 ml  Net -2123.99 ml   Filed Weights   06/19/23 1803 06/24/23 0459  Weight: 46 kg 47.9 kg    Examination: Physical Exam Constitutional:      General: She is not in acute distress.    Appearance: She is ill-appearing.  Cardiovascular:     Rate and Rhythm: Normal rate and regular rhythm.  Pulmonary:     Effort: Respiratory distress present.     Breath sounds: Rhonchi and rales present. No wheezing.  Neurological:     General: No focal deficit present.     Mental Status: She is alert. Mental status is at baseline.     Motor: Weakness present.      IMAGING     REVIEWED BY ME INDEPENDENTLY     Assessment & Plan:   #Acute on Chronic Hypoxic Respiratory Failure #ILD? Vs bronchiectasis due to atypical mycobacterial/ endemic infection         - need to perform infectious workup -  -Respiratory viral panel- negative -serum fungitell -legionella ab -strep  pneumoniae ur AG -Histoplasma Ur Ag -sputum resp cultures -AFB sputum expectorated specimen -reviewed pertinent imaging with patient today - ESR, CRP trend -please encourage patient to use incentive spirometer few times each hour while hospitalized.   -MRSA screening Currently empirically o and zithromax, will change to cefepime and zithromax due to bronchiectasis and solumedrol 60 bid , will reduce steroids  -currently on maximal setting on high flow nasal canula at risk of death  Type 2 NSTEMI  Need to rule out Acute PE , severe elevation of BNP.   -S/pTTE with no RV failure/McCollum sign - VQ scan today  -active cardiac disease   Severe microcytic anemia  -due to iron deficiency - definitely contributing to dyspnea and should be repleted -Feosol and vitamin C today     Labs   CBC: Recent Labs  Lab 06/20/23 0928 06/20/23 2326 06/21/23 0546 06/22/23 0358 06/23/23 0420 06/24/23 0445  WBC 14.8* 13.6* 16.7* 16.5* 16.4* 16.0*  NEUTROABS 11.6*  --  14.0*  --   --   --   HGB 7.5* 6.9* 7.7* 7.7* 7.5* 7.7*  HCT 24.1* 21.1* 23.4* 23.3* 22.8* 23.9*  MCV 77.5* 75.9* 75.5* 72.6* 73.3* 75.4*  PLT 431* 334 406* 450* 429* 436*    Basic Metabolic Panel: Recent Labs  Lab 06/20/23 2326 06/21/23 0546 06/22/23 0358 06/23/23 0420 06/24/23 0445  NA 132* 136  136 137 135  K 3.7 3.4* 3.6 3.6 4.0  CL 100 100 99 99 97*  CO2 24 27 27 29 28   GLUCOSE 194* 147* 151* 121* 156*  BUN 19 18 18 19 21   CREATININE 0.92 0.98 0.91 0.82 0.90  CALCIUM 7.9* 8.2* 8.3* 8.4* 8.5*  MG 1.7 1.7  --   --   --   PHOS 2.7  --   --   --   --    GFR: Estimated Creatinine Clearance: 25.4 mL/min (by C-G formula based on SCr of 0.9 mg/dL). Recent Labs  Lab 06/20/23 0510 06/20/23 0928 06/20/23 1912 06/20/23 2127 06/20/23 2325 06/20/23 2326 06/21/23 0546 06/22/23 0358 06/23/23 0420 06/24/23 0445  PROCALCITON 0.32  --   --   --   --   --   --   --   --   --   WBC 12.4* 14.8*  --   --   --    < >  16.7* 16.5* 16.4* 16.0*  LATICACIDVEN  --  1.6 3.2* 1.5 1.5  --   --   --   --   --    < > = values in this interval not displayed.    Liver Function Tests: Recent Labs  Lab 06/19/23 1918 06/20/23 1912 06/20/23 2326 06/21/23 0546 06/23/23 0420  AST 89* 144* 168* 173* 84*  ALT 53* 117* 130* 139* 144*  ALKPHOS 96 167* 148* 150* 135*  BILITOT 0.5 0.9 0.5 0.3 0.6  PROT 7.5 6.6 5.7* 6.1* 6.8  ALBUMIN 3.0* 2.7* 2.3* 2.6* 2.6*   No results for input(s): "LIPASE", "AMYLASE" in the last 168 hours. No results for input(s): "AMMONIA" in the last 168 hours.  ABG    Component Value Date/Time   PHART 7.3 (L) 06/20/2023 1829   PCO2ART 30 (L) 06/20/2023 1829   PO2ART 69 (L) 06/20/2023 1829   HCO3 27.8 06/21/2023 0546   ACIDBASEDEF 10.3 (H) 06/20/2023 1829   O2SAT 86.9 06/21/2023 0546     Coagulation Profile: Recent Labs  Lab 06/19/23 1918 06/20/23 0510  INR 1.1 1.1    Cardiac Enzymes: No results for input(s): "CKTOTAL", "CKMB", "CKMBINDEX", "TROPONINI" in the last 168 hours.  HbA1C: Hgb A1c MFr Bld  Date/Time Value Ref Range Status  02/18/2023 03:43 PM 6.1 (H) 4.8 - 5.6 % Final    Comment:             Prediabetes: 5.7 - 6.4          Diabetes: >6.4          Glycemic control for adults with diabetes: <7.0   08/27/2022 04:35 PM 6.6 (H) 4.8 - 5.6 % Final    Comment:             Prediabetes: 5.7 - 6.4          Diabetes: >6.4          Glycemic control for adults with diabetes: <7.0     CBG: Recent Labs  Lab 06/23/23 1823 06/23/23 2157 06/24/23 0723 06/24/23 1131 06/24/23 1645  GLUCAP 181* 175* 154* 153* 182*    Past Medical History:  She,  has a past medical history of Breast cancer (HCC), Cataract, Diabetes mellitus without complication (HCC), Hyperlipidemia, Hypertension, and Thyroid disease.   Surgical History:   Past Surgical History:  Procedure Laterality Date   BREAST SURGERY     EYE SURGERY       Social History:   reports that she has never  smoked. She has never used smokeless tobacco. She reports that she does not drink alcohol and does not use drugs.   Family History:  Her family history includes Healthy in her brother, daughter, daughter, daughter, sister, and sister.   Allergies No Known Allergies   Home Medications  Prior to Admission medications   Medication Sig Start Date End Date Taking? Authorizing Provider  alendronate (FOSAMAX) 70 MG tablet Take 1 tablet (70 mg total) by mouth every 7 (seven) days. Take with a full glass of water on an empty stomach. 02/05/23  Yes Simmons-Robinson, Makiera, MD  atorvastatin (LIPITOR) 20 MG tablet Take 1 tablet (20 mg total) by mouth daily. 08/27/22  Yes Bacigalupo, Marzella Schlein, MD  budesonide (PULMICORT) 0.5 MG/2ML nebulizer solution Take 2 mLs (0.5 mg total) by nebulization in the morning and at bedtime. 06/18/23 06/17/24 Yes Dgayli, Lianne Bushy, MD  Calcium Carbonate-Vitamin D 600-400 MG-UNIT tablet Take 1 tablet by mouth daily.    Yes [provider]  famotidine (PEPCID) 20 MG tablet Take 1 tablet (20 mg total) by mouth 2 (two) times daily. 01/01/23 01/01/24 Yes Bacigalupo, Marzella Schlein, MD  loratadine (CLARITIN) 10 MG tablet Take 1 tablet (10 mg total) by mouth daily. 04/08/23 04/07/24 Yes Dgayli, Lianne Bushy, MD  metFORMIN (GLUCOPHAGE) 500 MG tablet Take 1 tablet (500 mg total) by mouth 2 (two) times daily with a meal. 03/14/23  Yes Bacigalupo, Marzella Schlein, MD  omeprazole (PRILOSEC) 20 MG capsule Take 1 capsule (20 mg total) by mouth daily. 06/05/23  Yes Jacky Kindle, FNP  propranolol (INDERAL) 20 MG tablet Take 1 tablet (20 mg total) by mouth 2 (two) times daily. 04/22/23 10/19/23 Yes Bacigalupo, Marzella Schlein, MD  sertraline (ZOLOFT) 25 MG tablet Take 1 tablet (25 mg total) by mouth daily. 04/19/23  Yes Bacigalupo, Marzella Schlein, MD     Critical care provider statement:   Total critical care time: 109 minutes   Performed by: Karna Christmas MD   Critical care time was exclusive of separately billable  procedures and treating other patients.   Critical care was necessary to treat or prevent imminent or life-threatening deterioration.   Critical care was time spent personally by me on the following activities: development of treatment plan with patient and/or surrogate as well as nursing, discussions with consultants, evaluation of patient's response to treatment, examination of patient, obtaining history from patient or surrogate, ordering and performing treatments and interventions, ordering and review of laboratory studies, ordering and review of radiographic studies, pulse oximetry and re-evaluation of patient's condition.    Vida Rigger, M.D.  Pulmonary & Critical Care Medicine

## 2023-06-24 NOTE — Plan of Care (Signed)
  Problem: Clinical Measurements: Goal: Signs and symptoms of infection will decrease Outcome: Progressing   Problem: Coping: Goal: Ability to adjust to condition or change in health will improve Outcome: Progressing   Problem: Fluid Volume: Goal: Ability to maintain a balanced intake and output will improve Outcome: Progressing   Problem: Metabolic: Goal: Ability to maintain appropriate glucose levels will improve Outcome: Progressing   Problem: Clinical Measurements: Goal: Diagnostic test results will improve Outcome: Not Progressing   Problem: Respiratory: Goal: Ability to maintain adequate ventilation will improve Outcome: Not Progressing

## 2023-06-24 NOTE — Progress Notes (Signed)
PT Cancellation Note  Patient Details Name: Erin Wagner MRN: [id number] DOB: [birth year]   Cancelled Treatment:    Reason Eval/Treat Not Completed: Patient not medically ready.  Pt transferred to ICU after PT orders received.  Reached out to care team secondary to need for new PT orders.  Per MD/NP patient not appropriate for PT services at this time secondary to current respiratory status.  Will complete PT orders at this time but will reassess pt pending a change in status upon receipt of new PT orders.   Ovidio Hanger PT, DPT 06/24/23, 8:49 AM

## 2023-06-24 NOTE — Plan of Care (Signed)
  Problem: Respiratory: Goal: Ability to maintain adequate ventilation will improve Outcome: Not Progressing   

## 2023-06-24 NOTE — Assessment & Plan Note (Addendum)
Echocardiogram shows moderate pulmonary hypertension which could be secondary to lung findings.

## 2023-06-24 NOTE — Progress Notes (Addendum)
Progress Note   Patient: Erin Wagner:[id number] DOB: Jun 13, 1941 DOA: 06/19/2023     5 DOS: the patient was seen and examined on 06/24/2023   Brief hospital course: 82 year old female past medical history of breast cancer in remission, hypertension, hyperlipidemia, interstitial lung disease, hypothyroidism presented with acute onset cough and shortness of breath over the last 7 days.  She was on a recent steroid taper.  In the ER she was found to have hyponatremia, hyperkalemia, elevated liver function test, leukocytosis.  CT scan shows multifocal pneumonia with chronic interstitial lung disease.  12/15.  This morning was on 13 L high flow nasal cannula and progressed to heated high flow nasal cannula.  Case discussed with critical care specialist. One dose of lasix given. 12/16.  This morning patient was on heated high flow Nasal cannula 96% oxygen and +100% nonrebreather.  Assessment and Plan: * Acute hypoxemic respiratory failure (HCC) Patient made a DO NOT RESUSCITATE and DO NOT INTUBATE last night.  This morning on 100% nonrebreather plus heated high flow nasal cannula 96% oxygen with 50 L flow.  Patient does not wear oxygen at home.  Continue antibiotics and steroids and Lasix.  Severe sepsis (HCC) Secondary to multifocal pneumonia, tachypnea and leukocytosis.  Patient also has acute respiratory failure.  Patient on Zosyn, Zithromax and steroids.  Pulmonary hypertension (HCC) Echocardiogram shows moderate pulmonary hypertension.  Continue Lasix and blood pressure control.  Interstitial lung disease (HCC) Continue nebulizer treatments and IV Solu-Medrol.  Essential hypertension Continue Lasix  Hyponatremia Sodium normalized  Hypothyroidism Continue Synthroid  Myocardial injury Secondary to sepsis  Iron deficiency anemia Iron deficiency anemia.  Hemoglobin 7.7.  Hemoglobin stable but low.  May end up needing a blood transfusion during the hospital  course  Constipation Dulcolax tablet and Colace.  Uncontrolled type 2 diabetes mellitus with hyperglycemia, without long-term current use of insulin (HCC) Patient's on sliding scale insulin with the high-dose steroids.  GERD without esophagitis On Protonix  Dyslipidemia Holding atorvastatin  Depression Continue Zoloft  History of breast cancer .        Subjective: Patient states that she breathes slow.  Has some shortness of breath.  Patient on heated high flow nasal cannula 96% oxygen plus 100% nonrebreather.  Admitted with sepsis and respiratory failure.  Patient with poor appetite.  Has not had a bowel movement as of this morning.  Physical Exam: Vitals:   06/24/23 0820 06/24/23 0900 06/24/23 1000 06/24/23 1100  BP:  (!) 166/76 (!) 140/83 (!) 157/61  Pulse: 100 (!) 102 (!) 103 (!) 104  Resp: (!) 28 (!) 26 (!) 25 (!) 31  Temp:      TempSrc:      SpO2: 99% 97% 99% 98%  Weight:      Height:       Physical Exam HENT:     Head: Normocephalic.     Mouth/Throat:     Pharynx: No oropharyngeal exudate.  Eyes:     General: Lids are normal.     Conjunctiva/sclera: Conjunctivae normal.  Cardiovascular:     Rate and Rhythm: Normal rate and regular rhythm.     Heart sounds: Normal heart sounds, S1 normal and S2 normal.  Pulmonary:     Effort: Accessory muscle usage present.     Breath sounds: Examination of the right-middle field reveals decreased breath sounds. Examination of the left-middle field reveals decreased breath sounds. Examination of the right-lower field reveals decreased breath sounds and rhonchi. Examination of the left-lower field reveals  decreased breath sounds and rhonchi. Decreased breath sounds and rhonchi present. No wheezing or rales.  Abdominal:     Palpations: Abdomen is soft.     Tenderness: There is no abdominal tenderness.  Musculoskeletal:     Right lower leg: No swelling.     Left lower leg: No swelling.  Skin:    General: Skin is warm.      Findings: No rash.  Neurological:     Mental Status: She is alert.     Data Reviewed: Creatinine 0.9, white blood cell count 16.0, hemoglobin 7.7, platelet count 436 Echocardiogram shows an EF of 60 to 65% moderately elevated pulmonary arterial systolic pressure around 50 mg mercury.  Family Communication: Spoke with family at the bedside  Disposition: Status is: Inpatient Remains inpatient appropriate because: Patient on heated high flow nasal cannula at 100% nonrebreather.  Planned Discharge Destination: To be determined    Time spent: 27 minutes  Author: Alford Highland, MD 06/24/2023 12:06 PM  For on call review [email or web address].

## 2023-06-24 NOTE — Assessment & Plan Note (Addendum)
Last hemoglobin 11.4.  Patient given a unit of blood on 12/17.

## 2023-06-25 ENCOUNTER — Ambulatory Visit: Payer: Medicaid Other | Admitting: Family Medicine

## 2023-06-25 DIAGNOSIS — J849 Interstitial pulmonary disease, unspecified: Secondary | ICD-10-CM | POA: Diagnosis not present

## 2023-06-25 DIAGNOSIS — R6521 Severe sepsis with septic shock: Secondary | ICD-10-CM

## 2023-06-25 DIAGNOSIS — I272 Pulmonary hypertension, unspecified: Secondary | ICD-10-CM | POA: Diagnosis not present

## 2023-06-25 DIAGNOSIS — J9601 Acute respiratory failure with hypoxia: Secondary | ICD-10-CM | POA: Diagnosis not present

## 2023-06-25 DIAGNOSIS — A419 Sepsis, unspecified organism: Secondary | ICD-10-CM | POA: Diagnosis not present

## 2023-06-25 DIAGNOSIS — D508 Other iron deficiency anemias: Secondary | ICD-10-CM

## 2023-06-25 LAB — EXPECTORATED SPUTUM ASSESSMENT W GRAM STAIN, RFLX TO RESP C

## 2023-06-25 LAB — GLUCOSE, CAPILLARY
Glucose-Capillary: 118 mg/dL — ABNORMAL HIGH (ref 70–99)
Glucose-Capillary: 171 mg/dL — ABNORMAL HIGH (ref 70–99)
Glucose-Capillary: 242 mg/dL — ABNORMAL HIGH (ref 70–99)
Glucose-Capillary: 201 mg/dL — ABNORMAL HIGH (ref 70–99)

## 2023-06-25 LAB — CBC
HCT: 25 % — ABNORMAL LOW (ref 36.0–46.0)
Hemoglobin: 8 g/dL — ABNORMAL LOW (ref 12.0–15.0)
MCH: 24 pg — ABNORMAL LOW (ref 26.0–34.0)
MCHC: 32 g/dL (ref 30.0–36.0)
MCV: 75.1 fL — ABNORMAL LOW (ref 80.0–100.0)
Platelets: 464 10*3/uL — ABNORMAL HIGH (ref 150–400)
RBC: 3.33 MIL/uL — ABNORMAL LOW (ref 3.87–5.11)
RDW: 16 % — ABNORMAL HIGH (ref 11.5–15.5)
WBC: 15.8 10*3/uL — ABNORMAL HIGH (ref 4.0–10.5)
nRBC: 0.3 % — ABNORMAL HIGH (ref 0.0–0.2)

## 2023-06-25 LAB — COMPREHENSIVE METABOLIC PANEL
ALT: 83 U/L — ABNORMAL HIGH (ref 0–44)
AST: 32 U/L (ref 15–41)
Albumin: 2.5 g/dL — ABNORMAL LOW (ref 3.5–5.0)
Alkaline Phosphatase: 103 U/L (ref 38–126)
Anion gap: 9 (ref 5–15)
BUN: 22 mg/dL (ref 8–23)
CO2: 29 mmol/L (ref 22–32)
Calcium: 8.3 mg/dL — ABNORMAL LOW (ref 8.9–10.3)
Chloride: 98 mmol/L (ref 98–111)
Creatinine, Ser: 0.79 mg/dL (ref 0.44–1.00)
GFR, Estimated: >60 mL/min (ref >60)
Glucose, Bld: 116 mg/dL — ABNORMAL HIGH (ref 70–99)
Potassium: 3.6 mmol/L (ref 3.5–5.1)
Sodium: 136 mmol/L (ref 135–145)
Total Bilirubin: 0.5 mg/dL (ref <1.2)
Total Protein: 6.2 g/dL — ABNORMAL LOW (ref 6.5–8.1)

## 2023-06-25 LAB — FUNGITELL BETA-D-GLUCAN
Fungitell Value:: <31.25 pg/mL
Result Name:: NEGATIVE

## 2023-06-25 LAB — ABO/RH: ABO/RH(D): A POS

## 2023-06-25 LAB — C-REACTIVE PROTEIN: CRP: 11.6 mg/dL — ABNORMAL HIGH (ref <1.0)

## 2023-06-25 LAB — PREPARE RBC (CROSSMATCH)

## 2023-06-25 LAB — BRAIN NATRIURETIC PEPTIDE: B Natriuretic Peptide: 1205.8 pg/mL — ABNORMAL HIGH (ref 0.0–100.0)

## 2023-06-25 MED ORDER — DOCUSATE SODIUM 100 MG PO CAPS
200.0000 mg | ORAL_CAPSULE | Freq: Two times a day (BID) | ORAL | Status: DC
  Administered 2023-06-25 – 2023-06-26 (×3): 200 mg via ORAL
  Filled 2023-06-25 (×9): qty 2

## 2023-06-25 MED ORDER — SALINE SPRAY 0.65 % NA SOLN: 1.0000 | NASAL | Status: DC | PRN

## 2023-06-25 MED ORDER — SODIUM CHLORIDE 0.9% IV SOLUTION: Freq: Once | INTRAVENOUS | Status: AC

## 2023-06-25 MED ORDER — BISACODYL 5 MG PO TBEC: 5.0000 mg | DELAYED_RELEASE_TABLET | Freq: Every day | ORAL | Status: DC | PRN

## 2023-06-25 MED ORDER — KATE FARMS STANDARD 1.4 PO LIQD
325.0000 mL | Freq: Two times a day (BID) | ORAL | Status: DC
  Administered 2023-06-25 – 2023-06-30 (×6): 325 mL via ORAL

## 2023-06-25 MED ORDER — ADULT MULTIVITAMIN W/MINERALS CH
1.0000 | ORAL_TABLET | Freq: Every day | ORAL | Status: DC
  Administered 2023-06-26: 1 via ORAL
  Filled 2023-06-25: qty 1

## 2023-06-25 MED ORDER — LACTULOSE 10 GM/15ML PO SOLN
10.0000 g | Freq: Once | ORAL | Status: AC
  Administered 2023-06-25: 10 g via ORAL
  Filled 2023-06-25: qty 30

## 2023-06-25 NOTE — Progress Notes (Signed)
Initial Nutrition Assessment  DOCUMENTATION CODES:   Not applicable  INTERVENTION:   [employer] 1.4 po BID, each supplement provides 455 kcal and 20 grams protein.  Magic cup TID with meals, each supplement provides 290 kcal and 9 grams of protein  MVI po daily   Pt at high refeed risk; recommend monitor potassium, magnesium and phosphorus labs daily until stable  Daily weights   NUTRITION DIAGNOSIS:   Inadequate oral intake related to acute illness as evidenced by meal completion < 25%.  GOAL:   Patient will meet greater than or equal to 90% of their needs  MONITOR:   PO intake, Supplement acceptance, Labs, Weight trends, I & O's, Skin  REASON FOR ASSESSMENT:   Consult Assessment of nutrition requirement/status  ASSESSMENT:   82 y/o female with h/o breast cancer, depression, hypothyroid, HTN, HLD, GERD, ILD, IDA and pulmonary hypertension who is admitted with PNA, sepsis and MI.  Met with pt and pt's son in room today. Pt does not speak English so son is able to translate and provide some history. Son reports pt with fairly good appetite and oral intake at baseline. Son reports that patient is a vegetarian and does not eat any meat or eggs. Pt does eat dairy. Son reports that patient drinks protein powder powder mixed in a smoothie for breakfast each morning. RD discussed with pt the importance of adequate nutrition needed to preserve lean muscle. Pt has only been eating bites of meals in hospital r/t her SOB. Pt is willing to try vanilla supplements in hospital. RD will add supplements and MVI to help pt meet her estimated needs. Pt is at high refeed risk. No BM since admission; MD aware and pt is receiving bowel regimen. Per chart, pt appears weight stable at baseline.      Medications reviewed and include: vitamin C, dulcolax, colace, lovenox, ferrous sulfate, insulin, synthroid, protonix, solu-medrol, azithromycin   Labs reviewed: K 3.6 wnl BNP- 1205.8(H)- 12/17 Iron  20(L), TIBC 309- 12/16 Ferritin- 25- 12/12 Wbc- 15.8(H), Hgb 8.0(L), Hct 25.0(L), MCV 75.1(L), MCH 24.0(L) Cbgs- 171, 118 x 24 hrs  AIC 6.1(H)- 8/12  NUTRITION - FOCUSED PHYSICAL EXAM:  Flowsheet Row Most Recent Value  Orbital Region No depletion  Upper Arm Region No depletion  Thoracic and Lumbar Region No depletion  Buccal Region No depletion  Temple Region No depletion  Clavicle Bone Region Mild depletion  Clavicle and Acromion Bone Region Mild depletion  Scapular Bone Region No depletion  Dorsal Hand No depletion  Patellar Region Mild depletion  Anterior Thigh Region Mild depletion  Posterior Calf Region Mild depletion  Edema (RD Assessment) None  Hair Reviewed  Eyes Reviewed  Mouth Reviewed  Skin Reviewed  Nails Reviewed   Diet Order:   Diet Order             DIET DYS 3 Room service appropriate? Yes with Assist; Fluid consistency: Thin  Diet effective now                  EDUCATION NEEDS:   Education needs have been addressed  Skin:  Skin Assessment: Reviewed RN Assessment  Last BM:  PTA  Height:   Ht Readings from Last 1 Encounters:  06/23/23 4\' 3"  (1.295 m)    Weight:   Wt Readings from Last 1 Encounters:  06/25/23 47.6 kg    Ideal Body Weight:  38.6 kg  BMI:  Body mass index is 28.37 kg/m.  Estimated Nutritional Needs:   Kcal:  1000-1200kcal/day  Protein:  55-65g/day  Fluid:  1.0-1.2L/day  Betsey Holiday MS, RD, LDN If unable to be reached, please send secure chat to "RD inpatient" available from 8:00a-4:00p daily

## 2023-06-25 NOTE — Progress Notes (Signed)
NAME:  Erin Wagner, MRN:  [id number], DOB:  1941/04/18, LOS: 6 ADMISSION DATE:  06/19/2023, CHIEF COMPLAINT:  Respiratory Failure   History of Present Illness:   82 year old female with history of ILD presenting to the hospital with increased shortness of breath and cough.    Pertinent  Medical History   Patient's past medical history includes breast cancer status post bilateral mastectomy and chemoradiation. She also has a history of depression and anxiety  Interim History / Subjective:   Did develop increased shortness of breath yesterday she is getting worse and is at high risk of death currently on maximal settings on HFNC 95%/60L/min . 06/25/23- patient s/p CTPE,  remains severely critically ill.  Had family meeting today.  We may need to consider palliative care on this patient.    Objective   Blood pressure (!) 159/66, pulse (!) 103, temperature 98.3 F (36.8 C), temperature source Axillary, resp. rate (!) 26, height 4\' 3"  (1.295 m), weight 47.6 kg, SpO2 96%.    FiO2 (%):  [100 %] 100 %   Intake/Output Summary (Last 24 hours) at 06/25/2023 0757 Last data filed at 06/25/2023 0500 Gross per 24 hour  Intake 529.97 ml  Output 2200 ml  Net -1670.03 ml   Filed Weights   06/19/23 1803 06/24/23 0459 06/25/23 0500  Weight: 46 kg 47.9 kg 47.6 kg    Examination: Physical Exam Constitutional:      General: She is not in acute distress.    Appearance: She is ill-appearing.  Cardiovascular:     Rate and Rhythm: Normal rate and regular rhythm.  Pulmonary:     Effort: Respiratory distress present.     Breath sounds: Rhonchi and rales present. No wheezing.  Neurological:     General: No focal deficit present.     Mental Status: She is alert. Mental status is at baseline.     Motor: Weakness present.      IMAGING     REVIEWED BY ME INDEPENDENTLY     Assessment & Plan:   #Acute on Chronic Hypoxic Respiratory Failure #ILD? Vs bronchiectasis due to  atypical mycobacterial/ endemic infection         - need to perform infectious workup -  -Respiratory viral panel- negative -serum fungitell -legionella ab -strep pneumoniae ur AG -Histoplasma Ur Ag -sputum resp cultures -AFB sputum expectorated specimen -reviewed pertinent imaging with patient today - ESR, CRP trend -please encourage patient to use incentive spirometer few times each hour while hospitalized.   -MRSA screening Currently empirically o and zithromax, will change to cefepime and zithromax due to bronchiectasis and solumedrol 60 bid , will reduce steroids  -currently on maximal setting on high flow nasal canula at risk of death  Type 2 NSTEMI  Need to rule out Acute PE , severe elevation of BNP.   -S/pTTE with no RV failure/McCollum sign - VQ scan today  -active cardiac disease   Severe microcytic anemia  -due to iron deficiency - definitely contributing to dyspnea and should be repleted -Feosol and vitamin C today     Labs   CBC: Recent Labs  Lab 06/20/23 0928 06/20/23 2326 06/21/23 0546 06/22/23 0358 06/23/23 0420 06/24/23 0445 06/25/23 0355  WBC 14.8*   < > 16.7* 16.5* 16.4* 16.0* 15.8*  NEUTROABS 11.6*  --  14.0*  --   --   --   --   HGB 7.5*   < > 7.7* 7.7* 7.5* 7.7* 8.0*  HCT 24.1*   < >  23.4* 23.3* 22.8* 23.9* 25.0*  MCV 77.5*   < > 75.5* 72.6* 73.3* 75.4* 75.1*  PLT 431*   < > 406* 450* 429* 436* 464*   < > = values in this interval not displayed.    Basic Metabolic Panel: Recent Labs  Lab 06/20/23 2326 06/21/23 0546 06/22/23 0358 06/23/23 0420 06/24/23 0445 06/25/23 0355  NA 132* 136 136 137 135 136  K 3.7 3.4* 3.6 3.6 4.0 3.6  CL 100 100 99 99 97* 98  CO2 24 27 27 29 28 29   GLUCOSE 194* 147* 151* 121* 156* 116*  BUN 19 18 18 19 21 22   CREATININE 0.92 0.98 0.91 0.82 0.90 0.79  CALCIUM 7.9* 8.2* 8.3* 8.4* 8.5* 8.3*  MG 1.7 1.7  --   --   --   --   PHOS 2.7  --   --   --   --   --    GFR: Estimated Creatinine Clearance: 28.5  mL/min (by C-G formula based on SCr of 0.79 mg/dL). Recent Labs  Lab 06/20/23 0510 06/20/23 0928 06/20/23 1912 06/20/23 2127 06/20/23 2325 06/20/23 2326 06/22/23 0358 06/23/23 0420 06/24/23 0445 06/25/23 0355  PROCALCITON 0.32  --   --   --   --   --   --   --   --   --   WBC 12.4* 14.8*  --   --   --    < > 16.5* 16.4* 16.0* 15.8*  LATICACIDVEN  --  1.6 3.2* 1.5 1.5  --   --   --   --   --    < > = values in this interval not displayed.    Liver Function Tests: Recent Labs  Lab 06/20/23 1912 06/20/23 2326 06/21/23 0546 06/23/23 0420 06/25/23 0355  AST 144* 168* 173* 84* 32  ALT 117* 130* 139* 144* 83*  ALKPHOS 167* 148* 150* 135* 103  BILITOT 0.9 0.5 0.3 0.6 0.5  PROT 6.6 5.7* 6.1* 6.8 6.2*  ALBUMIN 2.7* 2.3* 2.6* 2.6* 2.5*   No results for input(s): "LIPASE", "AMYLASE" in the last 168 hours. No results for input(s): "AMMONIA" in the last 168 hours.  ABG    Component Value Date/Time   PHART 7.3 (L) 06/20/2023 1829   PCO2ART 30 (L) 06/20/2023 1829   PO2ART 69 (L) 06/20/2023 1829   HCO3 27.8 06/21/2023 0546   ACIDBASEDEF 10.3 (H) 06/20/2023 1829   O2SAT 86.9 06/21/2023 0546     Coagulation Profile: Recent Labs  Lab 06/19/23 1918 06/20/23 0510  INR 1.1 1.1    Cardiac Enzymes: No results for input(s): "CKTOTAL", "CKMB", "CKMBINDEX", "TROPONINI" in the last 168 hours.  HbA1C: Hgb A1c MFr Bld  Date/Time Value Ref Range Status  02/18/2023 03:43 PM 6.1 (H) 4.8 - 5.6 % Final    Comment:             Prediabetes: 5.7 - 6.4          Diabetes: >6.4          Glycemic control for adults with diabetes: <7.0   08/27/2022 04:35 PM 6.6 (H) 4.8 - 5.6 % Final    Comment:             Prediabetes: 5.7 - 6.4          Diabetes: >6.4          Glycemic control for adults with diabetes: <7.0     CBG: Recent Labs  Lab 06/24/23 0723 06/24/23 1131 06/24/23  1645 06/24/23 2143 06/25/23 0728  GLUCAP 154* 153* 182* 126* 118*    Past Medical History:  She,  has a  past medical history of Breast cancer (HCC), Cataract, Diabetes mellitus without complication (HCC), Hyperlipidemia, Hypertension, and Thyroid disease.   Surgical History:   Past Surgical History:  Procedure Laterality Date   BREAST SURGERY     EYE SURGERY       Social History:   reports that she has never smoked. She has never used smokeless tobacco. She reports that she does not drink alcohol and does not use drugs.   Family History:  Her family history includes Healthy in her brother, daughter, daughter, daughter, sister, and sister.   Allergies No Known Allergies   Home Medications  Prior to Admission medications   Medication Sig Start Date End Date Taking? Authorizing Provider  alendronate (FOSAMAX) 70 MG tablet Take 1 tablet (70 mg total) by mouth every 7 (seven) days. Take with a full glass of water on an empty stomach. 02/05/23  Yes Simmons-Robinson, Makiera, MD  atorvastatin (LIPITOR) 20 MG tablet Take 1 tablet (20 mg total) by mouth daily. 08/27/22  Yes Bacigalupo, Marzella Schlein, MD  budesonide (PULMICORT) 0.5 MG/2ML nebulizer solution Take 2 mLs (0.5 mg total) by nebulization in the morning and at bedtime. 06/18/23 06/17/24 Yes Dgayli, Lianne Bushy, MD  Calcium Carbonate-Vitamin D 600-400 MG-UNIT tablet Take 1 tablet by mouth daily.    Yes [provider]  famotidine (PEPCID) 20 MG tablet Take 1 tablet (20 mg total) by mouth 2 (two) times daily. 01/01/23 01/01/24 Yes Bacigalupo, Marzella Schlein, MD  loratadine (CLARITIN) 10 MG tablet Take 1 tablet (10 mg total) by mouth daily. 04/08/23 04/07/24 Yes Dgayli, Lianne Bushy, MD  metFORMIN (GLUCOPHAGE) 500 MG tablet Take 1 tablet (500 mg total) by mouth 2 (two) times daily with a meal. 03/14/23  Yes Bacigalupo, Marzella Schlein, MD  omeprazole (PRILOSEC) 20 MG capsule Take 1 capsule (20 mg total) by mouth daily. 06/05/23  Yes Jacky Kindle, FNP  propranolol (INDERAL) 20 MG tablet Take 1 tablet (20 mg total) by mouth 2 (two) times daily. 04/22/23 10/19/23 Yes  Bacigalupo, Marzella Schlein, MD  sertraline (ZOLOFT) 25 MG tablet Take 1 tablet (25 mg total) by mouth daily. 04/19/23  Yes Bacigalupo, Marzella Schlein, MD     Critical care provider statement:   Total critical care time: 33  minutes   Performed by: Karna Christmas MD   Critical care time was exclusive of separately billable procedures and treating other patients.   Critical care was necessary to treat or prevent imminent or life-threatening deterioration.   Critical care was time spent personally by me on the following activities: development of treatment plan with patient and/or surrogate as well as nursing, discussions with consultants, evaluation of patient's response to treatment, examination of patient, obtaining history from patient or surrogate, ordering and performing treatments and interventions, ordering and review of laboratory studies, ordering and review of radiographic studies, pulse oximetry and re-evaluation of patient's condition.    Vida Rigger, M.D.  Pulmonary & Critical Care Medicine

## 2023-06-25 NOTE — Plan of Care (Signed)
  Problem: Respiratory: Goal: Ability to maintain adequate ventilation will improve Outcome: Not Progressing   

## 2023-06-25 NOTE — Progress Notes (Signed)
Progress Note   Patient: Erin Wagner ZOX:[id number] DOB: 1941/05/08 DOA: 06/19/2023     6 DOS: the patient was seen and examined on 06/25/2023   Brief hospital course: 82 year old female past medical history of breast cancer in remission, hypertension, hyperlipidemia, interstitial lung disease, hypothyroidism presented with acute onset cough and shortness of breath over the last 7 days.  She was on a recent steroid taper.  In the ER she was found to have hyponatremia, hyperkalemia, elevated liver function test, leukocytosis.  CT scan shows multifocal pneumonia with chronic interstitial lung disease.  12/15.  This morning was on 13 L high flow nasal cannula and progressed to heated high flow nasal cannula.  Case discussed with critical care specialist. One dose of lasix given. 12/16.  This morning patient was on heated high flow Nasal cannula 96% oxygen and +100% nonrebreather.  CT angiogram of the chest was negative for pulmonary embolism, background chronic interstitial lung disease and fibrosis and widespread groundglass density throughout the lungs 12/17.  Patient short of breath on 100% heated high flow nasal cannula and 100% nonrebreather.  Assessment and Plan: * Acute hypoxemic respiratory failure (HCC) Patient made a DO NOT RESUSCITATE and DO NOT INTUBATE on this admission.  This morning on 100% nonrebreather plus heated high flow nasal cannula 100% oxygen with 50 L flow.  Patient does not wear oxygen at home.  Patient completed 5 days of Zosyn.  On Zithromax.  Pulmonary discontinued Lasix.  Continue steroids.  Sputum for AFB may end up being in MAI.  Septic shock (HCC) Present on admission.  Lactic acid 4.7.  Secondary to multifocal pneumonia, tachypnea and leukocytosis.  Patient also has acute respiratory failure.  Patient completed 5 days of Zosyn.  On Zithromax and steroids.  Pulmonary hypertension (HCC) Echocardiogram shows moderate pulmonary hypertension which could be  secondary to lung findings.  Interstitial lung disease (HCC) Continue nebulizer treatments and IV Solu-Medrol.  Essential hypertension Pulmonary held Lasix at this point  Hyponatremia Sodium normalized  Hypothyroidism Continue Synthroid  Myocardial injury Secondary to sepsis  Iron deficiency anemia Iron deficiency anemia.  Hemoglobin 8.0.  Pulmonary spoke with family about transfusion.  Iron supplementation.  Constipation Dulcolax tablet prn and increase Colace.  Uncontrolled type 2 diabetes mellitus with hyperglycemia, without long-term current use of insulin (HCC) Patient's on sliding scale insulin with the high-dose steroids.  GERD without esophagitis On Protonix  Dyslipidemia Holding atorvastatin  Depression Continue Zoloft  History of breast cancer .        Subjective: Patient short of breath.  Holding her saturations on heated high flow nasal cannula 100% oxygen and 100% nonrebreather.  Physical Exam: Vitals:   06/25/23 1300 06/25/23 1315 06/25/23 1330 06/25/23 1345  BP: (!) 158/74 (!) 149/65 (!) 153/63 (!) 144/65  Pulse: (!) 117 (!) 115 (!) 115 (!) 115  Resp: 20 (!) 36 (!) 31 (!) 26  Temp: 98.8 F (37.1 C)     TempSrc: Axillary     SpO2: 99% 99% 99% 99%  Weight:      Height:       Physical Exam HENT:     Head: Normocephalic.     Mouth/Throat:     Pharynx: No oropharyngeal exudate.  Eyes:     General: Lids are normal.     Conjunctiva/sclera: Conjunctivae normal.  Cardiovascular:     Rate and Rhythm: Normal rate and regular rhythm.     Heart sounds: Normal heart sounds, S1 normal and S2 normal.  Pulmonary:  Effort: Accessory muscle usage present.     Breath sounds: Examination of the right-middle field reveals decreased breath sounds. Examination of the left-middle field reveals decreased breath sounds. Examination of the right-lower field reveals decreased breath sounds and rhonchi. Examination of the left-lower field reveals decreased  breath sounds and rhonchi. Decreased breath sounds and rhonchi present. No wheezing or rales.  Abdominal:     Palpations: Abdomen is soft.     Tenderness: There is no abdominal tenderness.  Musculoskeletal:     Right lower leg: No swelling.     Left lower leg: No swelling.  Skin:    General: Skin is warm.     Findings: No rash.  Neurological:     Mental Status: She is alert.     Data Reviewed: CT scan of the chest shows no pulmonary embolism and background chronic interstitial lung disease and pulmonary fibrosis superimposed widespread groundglass density Creatinine 0.79, BNP 1205, AST 32, ALT 83, ferritin 25, iron 20, CRP 11.6, hemoglobin 8.0, white blood count 15.8, platelet count 464 Family Communication: Family at bedside  Disposition: Status is: Inpatient Remains inpatient appropriate because: Patient on 100% heated high flow nasal cannula +100% nonrebreather.  Planned Discharge Destination: To be determined    Time spent: 28 minutes  Author: Alford Highland, MD 06/25/2023 2:35 PM  For on call review [email or web address].

## 2023-06-25 NOTE — Plan of Care (Signed)
  Problem: Fluid Volume: Goal: Hemodynamic stability will improve Outcome: Progressing   Problem: Clinical Measurements: Goal: Diagnostic test results will improve Outcome: Progressing   Problem: Metabolic: Goal: Ability to maintain appropriate glucose levels will improve Outcome: Progressing   Problem: Skin Integrity: Goal: Risk for impaired skin integrity will decrease Outcome: Progressing   Problem: Clinical Measurements: Goal: Signs and symptoms of infection will decrease Outcome: Not Progressing   Problem: Respiratory: Goal: Ability to maintain adequate ventilation will improve Outcome: Not Progressing   Problem: Coping: Goal: Ability to adjust to condition or change in health will improve Outcome: Not Progressing   Problem: Nutritional: Goal: Maintenance of adequate nutrition will improve Outcome: Not Progressing

## 2023-06-26 ENCOUNTER — Inpatient Hospital Stay: Payer: Medicaid Other

## 2023-06-26 DIAGNOSIS — J849 Interstitial pulmonary disease, unspecified: Secondary | ICD-10-CM | POA: Diagnosis not present

## 2023-06-26 DIAGNOSIS — Z7189 Other specified counseling: Secondary | ICD-10-CM | POA: Diagnosis not present

## 2023-06-26 DIAGNOSIS — J9601 Acute respiratory failure with hypoxia: Secondary | ICD-10-CM | POA: Diagnosis not present

## 2023-06-26 DIAGNOSIS — A419 Sepsis, unspecified organism: Secondary | ICD-10-CM | POA: Diagnosis not present

## 2023-06-26 LAB — TYPE AND SCREEN
ABO/RH(D): A POS
Antibody Screen: NEGATIVE
Unit division: 0

## 2023-06-26 LAB — CBC
HCT: 33.4 % — ABNORMAL LOW (ref 36.0–46.0)
Hemoglobin: 10.6 g/dL — ABNORMAL LOW (ref 12.0–15.0)
MCH: 24.4 pg — ABNORMAL LOW (ref 26.0–34.0)
MCHC: 31.7 g/dL (ref 30.0–36.0)
MCV: 76.8 fL — ABNORMAL LOW (ref 80.0–100.0)
Platelets: 439 10*3/uL — ABNORMAL HIGH (ref 150–400)
RBC: 4.35 MIL/uL (ref 3.87–5.11)
RDW: 15.6 % — ABNORMAL HIGH (ref 11.5–15.5)
WBC: 15 10*3/uL — ABNORMAL HIGH (ref 4.0–10.5)
nRBC: 0.1 % (ref 0.0–0.2)

## 2023-06-26 LAB — BASIC METABOLIC PANEL
Anion gap: 9 (ref 5–15)
BUN: 19 mg/dL (ref 8–23)
CO2: 28 mmol/L (ref 22–32)
Calcium: 8.5 mg/dL — ABNORMAL LOW (ref 8.9–10.3)
Chloride: 101 mmol/L (ref 98–111)
Creatinine, Ser: 0.74 mg/dL (ref 0.44–1.00)
GFR, Estimated: >60 mL/min (ref >60)
Glucose, Bld: 145 mg/dL — ABNORMAL HIGH (ref 70–99)
Potassium: 4.5 mmol/L (ref 3.5–5.1)
Sodium: 138 mmol/L (ref 135–145)

## 2023-06-26 LAB — GLUCOSE, CAPILLARY
Glucose-Capillary: 130 mg/dL — ABNORMAL HIGH (ref 70–99)
Glucose-Capillary: 183 mg/dL — ABNORMAL HIGH (ref 70–99)
Glucose-Capillary: 256 mg/dL — ABNORMAL HIGH (ref 70–99)
Glucose-Capillary: 102 mg/dL — ABNORMAL HIGH (ref 70–99)

## 2023-06-26 LAB — MAGNESIUM: Magnesium: 2.4 mg/dL (ref 1.7–2.4)

## 2023-06-26 LAB — BPAM RBC
Blood Product Expiration Date: 202501122359
ISSUE DATE / TIME: 202412171243
Unit Type and Rh: 6200

## 2023-06-26 LAB — ASPERGILLUS ANTIGEN, BAL/SERUM: Aspergillus Ag, BAL/Serum: 0.04 {index} (ref 0.00–0.49)

## 2023-06-26 LAB — PHOSPHORUS: Phosphorus: 2.3 mg/dL — ABNORMAL LOW (ref 2.5–4.6)

## 2023-06-26 LAB — C-REACTIVE PROTEIN: CRP: 19.4 mg/dL — ABNORMAL HIGH (ref <1.0)

## 2023-06-26 MED ORDER — METHYLPREDNISOLONE SODIUM SUCC 40 MG IJ SOLR: 40.0000 mg | INTRAMUSCULAR | Status: DC

## 2023-06-26 MED ORDER — CEFEPIME HCL 2 G IV SOLR
2.0000 g | INTRAVENOUS | Status: DC
  Administered 2023-06-26: 2 g via INTRAVENOUS
  Filled 2023-06-26 (×3): qty 12.5

## 2023-06-26 MED ORDER — METOPROLOL TARTRATE 25 MG PO TABS
12.5000 mg | ORAL_TABLET | Freq: Two times a day (BID) | ORAL | Status: DC
  Administered 2023-06-26 – 2023-06-27 (×3): 12.5 mg via ORAL
  Filled 2023-06-26 (×3): qty 1

## 2023-06-26 NOTE — Progress Notes (Signed)
Pharmacy Antibiotic Note  Erin Wagner is a 82 y.o. female w/ PMH of  ILD, breast cancer in remission, HLD, HTN, hypothyroidism admitted on 06/19/2023 with pneumonia.  Pharmacy has been consulted for cefepime dosing.  Plan: start cefepime 2 grams IV every 24 hours ---follow renal function for needed dose adjustments  Height: 4\' 3"  (129.5 cm) Weight: 46.7 kg (102 lb 15.3 oz) IBW/kg (Calculated) : 24.8  Temp (24hrs), Avg:98.7 F (37.1 C), Min:97.7 F (36.5 C), Max:99.1 F (37.3 C)  Recent Labs  Lab 06/19/23 2035 06/20/23 0510 06/20/23 0928 06/20/23 1912 06/20/23 2127 06/20/23 2325 06/20/23 2326 06/22/23 0358 06/23/23 0420 06/24/23 0445 06/25/23 0355 06/26/23 0555  WBC  --    < > 14.8*  --   --   --    < > 16.5* 16.4* 16.0* 15.8* 15.0*  CREATININE  --    < >  --  0.93  --   --    < > 0.91 0.82 0.90 0.79 0.74  LATICACIDVEN 3.2*  --  1.6 3.2* 1.5 1.5  --   --   --   --   --   --    < > = values in this interval not displayed.    Estimated Creatinine Clearance: 28.3 mL/min (by C-G formula based on SCr of 0.74 mg/dL).    No Known Allergies  Antimicrobials this admission: 12/11 vancomycin x 1 12/12 Zosyn >> 12/16 12/11 azithromycin >>  12/18 cefepime >>  Microbiology results: 12/11 BCx: NG final 12/17 Sputum: GPC pairs (pending)  12/12 MRSA PCR: negative  Thank you for allowing pharmacy to be a part of this patient's care.  Lowella Bandy 06/26/2023 10:27 AM

## 2023-06-26 NOTE — Plan of Care (Signed)
  Problem: Fluid Volume: Goal: Hemodynamic stability will improve Outcome: Progressing   Problem: Clinical Measurements: Goal: Diagnostic test results will improve Outcome: Progressing Goal: Signs and symptoms of infection will decrease Outcome: Progressing   Problem: Coping: Goal: Ability to adjust to condition or change in health will improve Outcome: Progressing   Problem: Respiratory: Goal: Ability to maintain adequate ventilation will improve Outcome: Not Progressing   Problem: Education: Goal: Ability to describe self-care measures that may prevent or decrease complications (Diabetes Survival Skills Education) will improve Outcome: Not Progressing Goal: Individualized Educational Video(s) Outcome: Not Progressing   Problem: Metabolic: Goal: Ability to maintain appropriate glucose levels will improve Outcome: Not Progressing   Problem: Nutritional: Goal: Maintenance of adequate nutrition will improve Outcome: Not Progressing

## 2023-06-26 NOTE — Progress Notes (Signed)
PROGRESS NOTE    Erin Wagner  WUJ:[id number] DOB: 06/30/1941 DOA: 06/19/2023 PCP: Erasmo Downer, MD   Assessment & Plan:   Principal Problem:   Acute hypoxemic respiratory failure (HCC) Active Problems:   Septic shock (HCC)   Pulmonary hypertension (HCC)   Interstitial lung disease (HCC)   Essential hypertension   Hyponatremia   Hypothyroidism   Myocardial injury   History of breast cancer   Depression   Dyslipidemia   GERD without esophagitis   Uncontrolled type 2 diabetes mellitus with hyperglycemia, without long-term current use of insulin (HCC)   Constipation   Multifocal pneumonia   Iron deficiency anemia  Assessment and Plan:   Sepsis: met criteria w/ tachypnea, leukocytosis & pneumonia. Continue on IV zosyn. Hx of ILD & possibly pulmonary fibrosis as per CT finding. Continue on IV steroids, bronchodilators and encourage incentive spirometry. Pulmon following and recs apprec   Acute hypoxic respiratory failure: secondary to pneumonia. Continue on supplemental oxygen and wean as tolerated.    Pneumonia: continue on IV azithromycin, IV cefepime, IV steroids & bronchodilators. Encourage incentive spirometry & flutter valve. Pulmon recs apprec    Hyponatremia: resolved   Elevated troponin: likely secondary to demand ischemia in setting of sepsis    Hypothyroidism: continue on levothyroxine    DM2: well controlled, HbA1c 6.1. Holding home metformin. Continue on SSI w/ accuchecks    Transaminitis: likely secondary to sepsis. No acute findings on CT abd/pelvis. US gallbladder neg suspicious features to suggest cholecystitis, neg for gallstones or biliary dilatation    HTN: continue on metoprolol   ILD: continue on bronchodilators. Continue w/ supportive care. Pulmon fibrosis as per CT chest    GERD: continue on PPI    HLD: not on a statin as per med rec    Depression: severity unknown. Continue on home dose of sertraline      DVT  prophylaxis: \lovenox Code Status: DNR Family Communication: discussed pt's care w/ pt's grandson at bedside and answered his questions  Disposition Plan: likely d/c back home   Level of care: Stepdown Status is: Inpatient Remains inpatient appropriate because: severity of illness    Consultants:  Pulmon   Procedures:   Antimicrobials: cefepime, azithromycin    Subjective: Pt c/o shortness of breath   Objective: Vitals:   06/26/23 1000 06/26/23 1100 06/26/23 1119 06/26/23 1200  BP: (!) 151/76 (!) 151/75  (!) 177/84  Pulse: (!) 113 (!) 105  (!) 118  Resp: (!) 26 (!) 31  (!) 29  Temp:      TempSrc:      SpO2: 92% 94% 94% 92%  Weight:      Height:        Intake/Output Summary (Last 24 hours) at 06/26/2023 1233 Last data filed at 06/26/2023 1100 Gross per 24 hour  Intake 1475 ml  Output 775 ml  Net 700 ml   Filed Weights   06/24/23 0459 06/25/23 0500 06/26/23 0500  Weight: 47.9 kg 47.6 kg 46.7 kg    Examination:  General exam: Appears calm and comfortable  Respiratory system: course breath sounds b/l  Cardiovascular system: S1 & S2+. No  rubs, gallops or clicks.  Gastrointestinal system: Abdomen is nondistended, soft and nontender. . Normal bowel sounds heard. Central nervous system: lethargic. Moves all extremities  Psychiatry: Judgement and insight appears at baseline. Flat mood and affect     Data Reviewed: I have personally reviewed following labs and imaging studies  CBC: Recent Labs  Lab 06/20/23 [phone]  06/20/23 2326 06/21/23 0546 06/22/23 0358 06/23/23 0420 06/24/23 0445 06/25/23 0355 06/26/23 0555  WBC 14.8*   < > 16.7* 16.5* 16.4* 16.0* 15.8* 15.0*  NEUTROABS 11.6*  --  14.0*  --   --   --   --   --   HGB 7.5*   < > 7.7* 7.7* 7.5* 7.7* 8.0* 10.6*  HCT 24.1*   < > 23.4* 23.3* 22.8* 23.9* 25.0* 33.4*  MCV 77.5*   < > 75.5* 72.6* 73.3* 75.4* 75.1* 76.8*  PLT 431*   < > 406* 450* 429* 436* 464* 439*   < > = values in this interval not  displayed.   Basic Metabolic Panel: Recent Labs  Lab 06/20/23 2326 06/21/23 0546 06/22/23 0358 06/23/23 0420 06/24/23 0445 06/25/23 0355 06/26/23 0555  NA 132* 136 136 137 135 136 138  K 3.7 3.4* 3.6 3.6 4.0 3.6 4.5  CL 100 100 99 99 97* 98 101  CO2 24 27 27 29 28 29 28   GLUCOSE 194* 147* 151* 121* 156* 116* 145*  BUN 19 18 18 19 21 22 19   CREATININE 0.92 0.98 0.91 0.82 0.90 0.79 0.74  CALCIUM 7.9* 8.2* 8.3* 8.4* 8.5* 8.3* 8.5*  MG 1.7 1.7  --   --   --   --  2.4  PHOS 2.7  --   --   --   --   --  2.3*   GFR: Estimated Creatinine Clearance: 28.3 mL/min (by C-G formula based on SCr of 0.74 mg/dL). Liver Function Tests: Recent Labs  Lab 06/20/23 1912 06/20/23 2326 06/21/23 0546 06/23/23 0420 06/25/23 0355  AST 144* 168* 173* 84* 32  ALT 117* 130* 139* 144* 83*  ALKPHOS 167* 148* 150* 135* 103  BILITOT 0.9 0.5 0.3 0.6 0.5  PROT 6.6 5.7* 6.1* 6.8 6.2*  ALBUMIN 2.7* 2.3* 2.6* 2.6* 2.5*   No results for input(s): "LIPASE", "AMYLASE" in the last 168 hours. No results for input(s): "AMMONIA" in the last 168 hours. Coagulation Profile: Recent Labs  Lab 06/19/23 1918 06/20/23 0510  INR 1.1 1.1   Cardiac Enzymes: No results for input(s): "CKTOTAL", "CKMB", "CKMBINDEX", "TROPONINI" in the last 168 hours. BNP (last 3 results) No results for input(s): "PROBNP" in the last 8760 hours. HbA1C: No results for input(s): "HGBA1C" in the last 72 hours. CBG: Recent Labs  Lab 06/25/23 1116 06/25/23 1624 06/25/23 2115 06/26/23 0741 06/26/23 1200  GLUCAP 171* 242* 201* 130* 183*   Lipid Profile: No results for input(s): "CHOL", "HDL", "LDLCALC", "TRIG", "CHOLHDL", "LDLDIRECT" in the last 72 hours. Thyroid Function Tests: No results for input(s): "TSH", "T4TOTAL", "FREET4", "T3FREE", "THYROIDAB" in the last 72 hours. Anemia Panel: Recent Labs    06/24/23 1801  TIBC 309  IRON 20*   Sepsis Labs: Recent Labs  Lab 06/20/23 0510 06/20/23 0928 06/20/23 1912  06/20/23 2127 06/20/23 2325  PROCALCITON 0.32  --   --   --   --   LATICACIDVEN  --  1.6 3.2* 1.5 1.5    Recent Results (from the past 240 hours)  Blood Culture (routine x 2)     Status: None   Collection Time: 06/19/23  7:19 PM   Specimen: BLOOD  Result Value Ref Range Status   Specimen Description BLOOD BLOOD LEFT ARM  Final   Special Requests   Final    BOTTLES DRAWN AEROBIC AND ANAEROBIC Blood Culture adequate volume   Culture   Final    NO GROWTH 5 DAYS Performed at [facility], [address] [place], [place] [address]    Report Status 06/24/2023 FINAL  Final  Blood Culture (routine x 2)     Status: None   Collection Time: 06/19/23  7:19 PM   Specimen: BLOOD  Result Value Ref Range Status   Specimen Description BLOOD BLOOD RIGHT ARM  Final   Special Requests   Final    BOTTLES DRAWN AEROBIC AND ANAEROBIC Blood Culture adequate volume   Culture   Final    NO GROWTH 5 DAYS Performed at [facility], [address] [place], [place] [address]    Report Status 06/24/2023 FINAL  Final  Resp panel by RT-PCR (RSV, Flu A&B, Covid) Anterior Nasal Swab     Status: None   Collection Time: 06/19/23  8:03 PM   Specimen: Anterior Nasal Swab  Result Value Ref Range Status   SARS Coronavirus 2 by RT PCR NEGATIVE NEGATIVE Final    Comment: (NOTE) SARS-CoV-2 target nucleic acids are NOT DETECTED.  The SARS-CoV-2 RNA is generally detectable in upper respiratory specimens during the acute phase of infection. The lowest concentration of SARS-CoV-2 viral copies this assay can detect is 138 copies/mL. A negative result does not preclude SARS-Cov-2 infection and should not be used as the sole basis for treatment or other patient management decisions. A negative result may occur with  improper specimen collection/handling, submission of specimen other than nasopharyngeal swab, presence of viral mutation(s) within the areas targeted by this assay, and inadequate  number of viral copies(<138 copies/mL). A negative result must be combined with clinical observations, patient history, and epidemiological information. The expected result is Negative.  Fact Sheet for Patients:  BloggerCourse.com  Fact Sheet for Healthcare Providers:  SeriousBroker.it  This test is no t yet approved or cleared by the [place] FDA and  has been authorized for detection and/or diagnosis of SARS-CoV-2 by FDA under an Emergency Use Authorization (EUA). This EUA will remain  in effect (meaning this test can be used) for the duration of the COVID-19 declaration under Section 564(b)(1) of the Act, 21 U.S.C.section 360bbb-3(b)(1), unless the authorization is terminated  or revoked sooner.       Influenza A by PCR NEGATIVE NEGATIVE Final   Influenza B by PCR NEGATIVE NEGATIVE Final    Comment: (NOTE) The Xpert Xpress SARS-CoV-2/FLU/RSV plus assay is intended as an aid in the diagnosis of influenza from Nasopharyngeal swab specimens and should not be used as a sole basis for treatment. Nasal washings and aspirates are unacceptable for Xpert Xpress SARS-CoV-2/FLU/RSV testing.  Fact Sheet for Patients: BloggerCourse.com  Fact Sheet for Healthcare Providers: SeriousBroker.it  This test is not yet approved or cleared by the [place] FDA and has been authorized for detection and/or diagnosis of SARS-CoV-2 by FDA under an Emergency Use Authorization (EUA). This EUA will remain in effect (meaning this test can be used) for the duration of the COVID-19 declaration under Section 564(b)(1) of the Act, 21 U.S.C. section 360bbb-3(b)(1), unless the authorization is terminated or revoked.     Resp Syncytial Virus by PCR NEGATIVE NEGATIVE Final    Comment: (NOTE) Fact Sheet for Patients: BloggerCourse.com  Fact Sheet for Healthcare  Providers: SeriousBroker.it  This test is not yet approved or cleared by the [place] FDA and has been authorized for detection and/or diagnosis of SARS-CoV-2 by FDA under an Emergency Use Authorization (EUA). This EUA will remain in effect (meaning this test can be used) for the duration of the COVID-19 declaration  under Section 564(b)(1) of the Act, 21 U.S.C. section 360bbb-3(b)(1), unless the authorization is terminated or revoked.  Performed at [facility], [address] [place], [place] [address]   Respiratory (~20 pathogens) panel by PCR     Status: None   Collection Time: 06/20/23  9:28 AM   Specimen: Nasopharyngeal Swab; Respiratory  Result Value Ref Range Status   Adenovirus NOT DETECTED NOT DETECTED Final   Coronavirus 229E NOT DETECTED NOT DETECTED Final    Comment: (NOTE) The Coronavirus on the Respiratory Panel, DOES NOT test for the novel  Coronavirus (2019 nCoV)    Coronavirus HKU1 NOT DETECTED NOT DETECTED Final   Coronavirus NL63 NOT DETECTED NOT DETECTED Final   Coronavirus OC43 NOT DETECTED NOT DETECTED Final   Metapneumovirus NOT DETECTED NOT DETECTED Final   Rhinovirus / Enterovirus NOT DETECTED NOT DETECTED Final   Influenza A NOT DETECTED NOT DETECTED Final   Influenza B NOT DETECTED NOT DETECTED Final   Parainfluenza Virus 1 NOT DETECTED NOT DETECTED Final   Parainfluenza Virus 2 NOT DETECTED NOT DETECTED Final   Parainfluenza Virus 3 NOT DETECTED NOT DETECTED Final   Parainfluenza Virus 4 NOT DETECTED NOT DETECTED Final   Respiratory Syncytial Virus NOT DETECTED NOT DETECTED Final   Bordetella pertussis NOT DETECTED NOT DETECTED Final   Bordetella Parapertussis NOT DETECTED NOT DETECTED Final   Chlamydophila pneumoniae NOT DETECTED NOT DETECTED Final   Mycoplasma pneumoniae NOT DETECTED NOT DETECTED Final    Comment: Performed at [facility], [address], [place], [place] [address]  MRSA Next Gen by  PCR, Nasal     Status: None   Collection Time: 06/20/23  9:28 AM   Specimen: Nasopharyngeal Swab; Nasal Swab  Result Value Ref Range Status   MRSA by PCR Next Gen NOT DETECTED NOT DETECTED Final    Comment: (NOTE) The GeneXpert MRSA Assay (FDA approved for NASAL specimens only), is one component of a comprehensive MRSA colonization surveillance program. It is not intended to diagnose MRSA infection nor to guide or monitor treatment for MRSA infections. Test performance is not FDA approved in patients less than 21 years old. Performed at [facility], [address] [place], [place] [address]   Aspergillus Ag, BAL/Serum     Status: None   Collection Time: 06/23/23 11:37 AM   Specimen: Vein  Result Value Ref Range Status   Aspergillus Ag, BAL/Serum 0.04 0.00 - 0.49 Index Final    Comment: (NOTE) Performed At: [facility] [address] [place], [place] [id number] Jolene Schimke MD VH:[id number]   Expectorated Sputum Assessment w Gram Stain, Rflx to Resp Cult     Status: None   Collection Time: 06/25/23  9:44 AM   Specimen: SPU  Result Value Ref Range Status   Specimen Description SPUTUM  Final   Special Requests NONE  Final   Sputum evaluation   Final    THIS SPECIMEN IS ACCEPTABLE FOR SPUTUM CULTURE Performed at [facility], [address] [place], [place] [address]    Report Status 06/25/2023 FINAL  Final  Culture, Respiratory w Gram Stain     Status: None (Preliminary result)   Collection Time: 06/25/23  9:44 AM   Specimen: SPU  Result Value Ref Range Status   Specimen Description   Final    SPUTUM Performed at [facility], [address] [place], [place] [address]    Special Requests   Final    NONE Performed at [facility], [address], [place], [place] [address]    Gram Stain   Final    NO WBC SEEN RARE GRAM POSITIVE COCCI IN PAIRS Performed at [facility], [address], [place], [place]  [address]    Culture PENDING  Incomplete   Report Status PENDING  Incomplete         Radiology Studies: CT Angio Chest Pulmonary Embolism (PE) W or WO Contrast Result Date: 06/24/2023 CLINICAL DATA:  Shortness of breath and cough EXAM: CT ANGIOGRAPHY CHEST WITH CONTRAST TECHNIQUE: Multidetector CT imaging of the chest was performed using the standard protocol during bolus administration of intravenous contrast. Multiplanar CT image reconstructions and MIPs were obtained to evaluate the vascular anatomy. RADIATION DOSE REDUCTION: This exam was performed according to the departmental dose-optimization program which includes automated exposure control, adjustment of the mA and/or kV according to patient size and/or use of iterative reconstruction technique. CONTRAST:  75mL OMNIPAQUE IOHEXOL 350 MG/ML SOLN COMPARISON:  Chest x-ray 06/23/2023, CT chest 06/20/2023, 09/25/2022 FINDINGS: Cardiovascular: Satisfactory opacification of the pulmonary arteries to the segmental level. No evidence of pulmonary embolism. Moderate aortic atherosclerosis. No aneurysm or dissection. Mild cardiomegaly. No pericardial effusion. Mediastinum/Nodes: Patent trachea. No suspicious thyroid mass. Subcentimeter mediastinal lymph nodes. Enlarged subcarinal lymph node measuring 2.2 cm. Esophagus within normal limits. Lungs/Pleura: Background chronic interstitial lung disease and pulmonary fibrosis. Superimposed widespread ground-glass density, now more homogeneous throughout the lungs compared to the recent CT and less dense. No consolidation. Resolution of pleural effusions. Upper Abdomen: No acute finding. Musculoskeletal: Circumferential edema within the left upper extremity incompletely visualized. There are degenerative changes of the spine. No acute osseous abnormality. Bilateral mastectomies. Review of the MIP images confirms the above findings. IMPRESSION: 1. Negative for acute pulmonary embolus. 2. Background chronic  interstitial lung disease and pulmonary fibrosis. Superimposed widespread ground-glass density, now more homogeneous throughout the lungs compared to the recent CT but less dense and consolidative in appearance. Findings could be due pulmonary edema, or improving pneumonia. There has been resolution of the previously noted bilateral pleural effusions. 3. Enlarged subcarinal lymph node, likely reactive. 4. Circumferential edema within the left upper extremity incompletely visualized. 5. Aortic atherosclerosis. Aortic Atherosclerosis (ICD10-I70.0). Electronically Signed   By: Jasmine Pang M.D.   On: 06/24/2023 23:11        Scheduled Meds:  ascorbic acid  500 mg Oral BID   bisacodyl  5 mg Oral Once   Chlorhexidine Gluconate Cloth  6 each Topical Daily   docusate sodium  200 mg Oral BID   enoxaparin (LOVENOX) injection  30 mg Subcutaneous Q24H   feeding supplement (K[employer]MS STANDARD 1.4)  325 mL Oral BID BM   ferrous sulfate  325 mg Oral BID WC   guaiFENesin  600 mg Oral BID   insulin aspart  0-15 Units Subcutaneous TID AC & HS   ipratropium-albuterol  3 mL Nebulization QID   levothyroxine  25 mcg Oral Q0600   loratadine  10 mg Oral Daily   [START ON 06/27/2023] methylPREDNISolone (SOLU-MEDROL) injection  40 mg Intravenous Q24H   multivitamin with minerals  1 tablet Oral Daily   pantoprazole  40 mg Oral Daily   sertraline  25 mg Oral Daily   Continuous Infusions:  azithromycin Stopped (06/25/23 2306)   ceFEPime (MAXIPIME) IV       LOS: 7 days      Charise Killian, MD [facility] Pager [phone]  If 7PM-7AM, please contact night-coverage [email or web address] 06/26/2023,  12:33 PM

## 2023-06-26 NOTE — Telephone Encounter (Signed)
Dr. Aundria Rud, in order for insurance to pay for nebulizer machine. Your note needs to state reasoning.

## 2023-06-26 NOTE — Progress Notes (Signed)
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    Pertinent  Medical History   Patient's past medical history includes breast cancer status post bilateral mastectomy and chemoradiation. She also has a history of depression and anxiety  Interim History / Subjective:   06/25/23- Did develop increased shortness of breath yesterday she is getting worse and is at high risk of death currently on maximal settings on HFNC 95%/60L/min .  Reviewed imaging with family  06/26/23- patient is improved, s/p pRBC transfusion now more stable able to speak and have weaned O2 to 80%.  Provided plant based diet. Lengthy discussion with family.  Palliative care consultation ordered.    Objective   Blood pressure (!) 157/72, pulse 96, temperature 98.9 F (37.2 C), temperature source Oral, resp. rate (!) 24, height 4\' 3"  (1.295 m), weight 46.7 kg, SpO2 97%.    FiO2 (%):  [75 %-100 %] 80 %   Intake/Output Summary (Last 24 hours) at 06/26/2023 0738 Last data filed at 06/26/2023 0600 Gross per 24 hour  Intake 1030 ml  Output 825 ml  Net 205 ml   Filed Weights   06/24/23 0459 06/25/23 0500 06/26/23 0500  Weight: 47.9 kg 47.6 kg 46.7 kg    Examination: Physical Exam Constitutional:      General: She is not in acute distress.    Appearance: She is ill-appearing.  Cardiovascular:     Rate and Rhythm: Normal rate and regular rhythm.  Pulmonary:     Effort: Respiratory distress present.     Breath sounds: Rhonchi and rales present. No wheezing.  Neurological:     General: No focal deficit present.     Mental Status: She is alert. Mental status is at baseline.     Motor: Weakness present.      IMAGING     REVIEWED BY ME INDEPENDENTLY     Assessment &  Plan:   #Acute on Chronic Hypoxic Respiratory Failure #ILD? Vs bronchiectasis due to atypical mycobacterial/ endemic infection         - need to perform infectious workup -  -Respiratory viral panel- negative -serum fungitell -legionella ab -strep pneumoniae ur AG -Histoplasma Ur Ag -sputum resp cultures -AFB sputum expectorated specimen -reviewed pertinent imaging with patient today - ESR, CRP trend  -please encourage patient to use incentive spirometer few times each hour while hospitalized.   -MRSA screening Currently empirically on and zithromax, will change to cefepime and zithromax due to bronchiectasis  -currently on HFNC with weaning now on 80%/50 from 100%/60 yesterday -continue steroids , weaned to 40 daily    Type 2 NSTEMI  Need to rule out Acute PE , severe elevation of BNP.   -S/pTTE with no RV failure/McCollum sign - VQ scan today  -active cardiac disease   Severe microcytic anemia  -due to iron deficiency - definitely contributing to dyspnea and should be repleted -Feosol and vitamin C today  -s/p prbc transfusion   Labs   CBC: Recent Labs  Lab 06/20/23 0928 06/20/23 2326 06/21/23 0546 06/22/23 0358 06/23/23 0420 06/24/23 0445 06/25/23 0355 06/26/23 0555  WBC 14.8*   < > 16.7* 16.5* 16.4* 16.0* 15.8* 15.0*  NEUTROABS 11.6*  --  14.0*  --   --   --   --   --  HGB 7.5*   < > 7.7* 7.7* 7.5* 7.7* 8.0* 10.6*  HCT 24.1*   < > 23.4* 23.3* 22.8* 23.9* 25.0* 33.4*  MCV 77.5*   < > 75.5* 72.6* 73.3* 75.4* 75.1* 76.8*  PLT 431*   < > 406* 450* 429* 436* 464* 439*   < > = values in this interval not displayed.    Basic Metabolic Panel: Recent Labs  Lab 06/20/23 2326 06/21/23 0546 06/22/23 0358 06/23/23 0420 06/24/23 0445 06/25/23 0355 06/26/23 0555  NA 132* 136 136 137 135 136 138  K 3.7 3.4* 3.6 3.6 4.0 3.6 4.5  CL 100 100 99 99 97* 98 101  CO2 24 27 27 29 28 29 28   GLUCOSE 194* 147* 151* 121* 156* 116* 145*  BUN 19 18 18 19 21 22 19    CREATININE 0.92 0.98 0.91 0.82 0.90 0.79 0.74  CALCIUM 7.9* 8.2* 8.3* 8.4* 8.5* 8.3* 8.5*  MG 1.7 1.7  --   --   --   --  2.4  PHOS 2.7  --   --   --   --   --  2.3*   GFR: Estimated Creatinine Clearance: 28.3 mL/min (by C-G formula based on SCr of 0.74 mg/dL). Recent Labs  Lab 06/20/23 0510 06/20/23 0928 06/20/23 1912 06/20/23 2127 06/20/23 2325 06/20/23 2326 06/23/23 0420 06/24/23 0445 06/25/23 0355 06/26/23 0555  PROCALCITON 0.32  --   --   --   --   --   --   --   --   --   WBC 12.4* 14.8*  --   --   --    < > 16.4* 16.0* 15.8* 15.0*  LATICACIDVEN  --  1.6 3.2* 1.5 1.5  --   --   --   --   --    < > = values in this interval not displayed.    Liver Function Tests: Recent Labs  Lab 06/20/23 1912 06/20/23 2326 06/21/23 0546 06/23/23 0420 06/25/23 0355  AST 144* 168* 173* 84* 32  ALT 117* 130* 139* 144* 83*  ALKPHOS 167* 148* 150* 135* 103  BILITOT 0.9 0.5 0.3 0.6 0.5  PROT 6.6 5.7* 6.1* 6.8 6.2*  ALBUMIN 2.7* 2.3* 2.6* 2.6* 2.5*   No results for input(s): "LIPASE", "AMYLASE" in the last 168 hours. No results for input(s): "AMMONIA" in the last 168 hours.  ABG    Component Value Date/Time   PHART 7.3 (L) 06/20/2023 1829   PCO2ART 30 (L) 06/20/2023 1829   PO2ART 69 (L) 06/20/2023 1829   HCO3 27.8 06/21/2023 0546   ACIDBASEDEF 10.3 (H) 06/20/2023 1829   O2SAT 86.9 06/21/2023 0546     Coagulation Profile: Recent Labs  Lab 06/19/23 1918 06/20/23 0510  INR 1.1 1.1    Cardiac Enzymes: No results for input(s): "CKTOTAL", "CKMB", "CKMBINDEX", "TROPONINI" in the last 168 hours.  HbA1C: Hgb A1c MFr Bld  Date/Time Value Ref Range Status  02/18/2023 03:43 PM 6.1 (H) 4.8 - 5.6 % Final    Comment:             Prediabetes: 5.7 - 6.4          Diabetes: >6.4          Glycemic control for adults with diabetes: <7.0   08/27/2022 04:35 PM 6.6 (H) 4.8 - 5.6 % Final    Comment:             Prediabetes: 5.7 - 6.4  Diabetes: >6.4          Glycemic  control for adults with diabetes: <7.0     CBG: Recent Labs  Lab 06/24/23 2143 06/25/23 0728 06/25/23 1116 06/25/23 1624 06/25/23 2115  GLUCAP 126* 118* 171* 242* 201*    Past Medical History:  She,  has a past medical history of Breast cancer (HCC), Cataract, Diabetes mellitus without complication (HCC), Hyperlipidemia, Hypertension, and Thyroid disease.   Surgical History:   Past Surgical History:  Procedure Laterality Date   BREAST SURGERY     EYE SURGERY       Social History:   reports that she has never smoked. She has never used smokeless tobacco. She reports that she does not drink alcohol and does not use drugs.   Family History:  Her family history includes Healthy in her brother, daughter, daughter, daughter, sister, and sister.   Allergies No Known Allergies   Home Medications  Prior to Admission medications   Medication Sig Start Date End Date Taking? Authorizing Provider  alendronate (FOSAMAX) 70 MG tablet Take 1 tablet (70 mg total) by mouth every 7 (seven) days. Take with a full glass of water on an empty stomach. 02/05/23  Yes Simmons-Robinson, Makiera, MD  atorvastatin (LIPITOR) 20 MG tablet Take 1 tablet (20 mg total) by mouth daily. 08/27/22  Yes Bacigalupo, Marzella Schlein, MD  budesonide (PULMICORT) 0.5 MG/2ML nebulizer solution Take 2 mLs (0.5 mg total) by nebulization in the morning and at bedtime. 06/18/23 06/17/24 Yes Dgayli, Lianne Bushy, MD  Calcium Carbonate-Vitamin D 600-400 MG-UNIT tablet Take 1 tablet by mouth daily.    Yes [provider]  famotidine (PEPCID) 20 MG tablet Take 1 tablet (20 mg total) by mouth 2 (two) times daily. 01/01/23 01/01/24 Yes Bacigalupo, Marzella Schlein, MD  loratadine (CLARITIN) 10 MG tablet Take 1 tablet (10 mg total) by mouth daily. 04/08/23 04/07/24 Yes Dgayli, Lianne Bushy, MD  metFORMIN (GLUCOPHAGE) 500 MG tablet Take 1 tablet (500 mg total) by mouth 2 (two) times daily with a meal. 03/14/23  Yes Bacigalupo, Marzella Schlein, MD  omeprazole  (PRILOSEC) 20 MG capsule Take 1 capsule (20 mg total) by mouth daily. 06/05/23  Yes Jacky Kindle, FNP  propranolol (INDERAL) 20 MG tablet Take 1 tablet (20 mg total) by mouth 2 (two) times daily. 04/22/23 10/19/23 Yes Bacigalupo, Marzella Schlein, MD  sertraline (ZOLOFT) 25 MG tablet Take 1 tablet (25 mg total) by mouth daily. 04/19/23  Yes Bacigalupo, Marzella Schlein, MD     Critical care provider statement:   Total critical care time: 33 minutes   Performed by: Karna Christmas MD   Critical care time was exclusive of separately billable procedures and treating other patients.   Critical care was necessary to treat or prevent imminent or life-threatening deterioration.   Critical care was time spent personally by me on the following activities: development of treatment plan with patient and/or surrogate as well as nursing, discussions with consultants, evaluation of patient's response to treatment, examination of patient, obtaining history from patient or surrogate, ordering and performing treatments and interventions, ordering and review of laboratory studies, ordering and review of radiographic studies, pulse oximetry and re-evaluation of patient's condition.    Vida Rigger, M.D.  Pulmonary & Critical Care Medicine

## 2023-06-26 NOTE — Consult Note (Signed)
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   Clinical Assessment and Goals of Care: Patient is resting in bed on high flow. Lucila Maine is at bedside. Lucila Maine states his mother moved here from [place] in 2017. He states she is widowed and has 4 children. He advises patient lives with patient's son (his father).   He discusses previous cancer with oncologic intervention. He states following chemo was when DM was diagnosed. He advises she had covid in 2022 and developed an intermittent cough, which worsened a year after that. He denies O2 use prior to this admission. He states she did not complain or appear to have SOB.   He states his family is aware of the poor prognosis, but at this time, the patient wishes to continue care. He states she is of Hindu faith, and is hopeful God will heal her. He states the family will honor her wishes. Discussed following up tomorrow to speak with her.     SUMMARY OF RECOMMENDATIONS   PMT to follow.   Prognosis:  poor       Primary Diagnoses: Present on Admission:  Hypothyroidism  Depression   I have reviewed the medical record, interviewed the patient and family, and examined the patient. The following aspects are pertinent.  Past Medical History:  Diagnosis Date   Breast cancer (HCC)    Cataract    Diabetes mellitus without complication (HCC)    Hyperlipidemia    Hypertension    Thyroid disease    Social History   Socioeconomic History   Marital status: Widowed    Spouse name: Not on file   Number of  children: 4   Years of education: Not on file   Highest education level: 6th grade  Occupational History    Employer: retired    Comment: homemaker  Tobacco Use   Smoking status: Never   Smokeless tobacco: Never  Vaping Use   Vaping status: Never Used  Substance and Sexual Activity   Alcohol use: No   Drug use: No   Sexual activity: Not on file  Other Topics Concern   Not on file  Social History Narrative   Not on file   Social Drivers of Health   Financial Resource Strain: Low Risk  (02/14/2023)   Overall Financial Resource Strain (CARDIA)    Difficulty of Paying Living Expenses: Not very hard  Food Insecurity: No Food Insecurity (06/22/2023)   Hunger Vital Sign    Worried About Running Out of Food in the Last Year: Never true    Ran Out of Food in the Last Year: Never true  Transportation Needs: No Transportation Needs (06/22/2023)   PRAPARE - Transportation  Lack of Transportation (Medical): No    Lack of Transportation (Non-Medical): No  Physical Activity: Insufficiently Active (02/14/2023)   Exercise Vital Sign    Days of Exercise per Week: 4 days    Minutes of Exercise per Session: 10 min  Stress: No Stress Concern Present (02/14/2023)   [employer] of Occupational Health - Occupational Stress Questionnaire    Feeling of Stress : Only a little  Social Connections: Moderately Isolated (02/14/2023)   Social Connection and Isolation Panel [NHANES]    Frequency of Communication with Friends and Family: Once a week    Frequency of Social Gatherings with Friends and Family: More than three times a week    Attends Religious Services: 1 to 4 times per year    Active Member of [employer] or Organizations: No    Attends Banker Meetings: Not on file    Marital Status: Widowed   Family History  Problem Relation Age of Onset   Healthy Sister    Healthy Brother    Healthy Daughter    Healthy Sister    Healthy Daughter    Healthy Daughter    Scheduled Meds:   ascorbic acid  500 mg Oral BID   bisacodyl  5 mg Oral Once   Chlorhexidine Gluconate Cloth  6 each Topical Daily   docusate sodium  200 mg Oral BID   enoxaparin (LOVENOX) injection  30 mg Subcutaneous Q24H   feeding supplement (KATE FARMS STANDARD 1.4)  325 mL Oral BID BM   ferrous sulfate  325 mg Oral BID WC   guaiFENesin  600 mg Oral BID   insulin aspart  0-15 Units Subcutaneous TID AC & HS   ipratropium-albuterol  3 mL Nebulization QID   levothyroxine  25 mcg Oral Q0600   [START ON 06/27/2023] methylPREDNISolone (SOLU-MEDROL) injection  40 mg Intravenous Q24H   metoprolol tartrate  12.5 mg Oral BID   pantoprazole  40 mg Oral Daily   sertraline  25 mg Oral Daily   Continuous Infusions:  azithromycin Stopped (06/25/23 2306)   ceFEPime (MAXIPIME) IV Stopped (06/26/23 1340)   PRN Meds:.acetaminophen **OR** acetaminophen, bisacodyl, magnesium hydroxide, [DISCONTINUED] ondansetron **OR** ondansetron (ZOFRAN) IV, mouth rinse, sodium chloride, traZODone Medications Prior to Admission:  Prior to Admission medications   Medication Sig Start Date End Date Taking? Authorizing Provider  alendronate (FOSAMAX) 70 MG tablet Take 1 tablet (70 mg total) by mouth every 7 (seven) days. Take with a full glass of water on an empty stomach. 02/05/23  Yes Simmons-Robinson, Makiera, MD  atorvastatin (LIPITOR) 20 MG tablet Take 1 tablet (20 mg total) by mouth daily. 08/27/22  Yes Bacigalupo, Marzella Schlein, MD  budesonide (PULMICORT) 0.5 MG/2ML nebulizer solution Take 2 mLs (0.5 mg total) by nebulization in the morning and at bedtime. 06/18/23 06/17/24 Yes Dgayli, Lianne Bushy, MD  Calcium Carbonate-Vitamin D 600-400 MG-UNIT tablet Take 1 tablet by mouth daily.    Yes [provider]  famotidine (PEPCID) 20 MG tablet Take 1 tablet (20 mg total) by mouth 2 (two) times daily. 01/01/23 01/01/24 Yes Bacigalupo, Marzella Schlein, MD  loratadine (CLARITIN) 10 MG tablet Take 1 tablet (10 mg total) by mouth daily. 04/08/23 04/07/24  Yes Dgayli, Lianne Bushy, MD  metFORMIN (GLUCOPHAGE) 500 MG tablet Take 1 tablet (500 mg total) by mouth 2 (two) times daily with a meal. 03/14/23  Yes Bacigalupo, Marzella Schlein, MD  omeprazole (PRILOSEC) 20 MG capsule Take 1 capsule (20 mg total) by mouth daily. 06/05/23  Yes Jacky Kindle,  FNP  propranolol (INDERAL) 20 MG tablet Take 1 tablet (20 mg total) by mouth 2 (two) times daily. 04/22/23 10/19/23 Yes Bacigalupo, Marzella Schlein, MD  sertraline (ZOLOFT) 25 MG tablet Take 1 tablet (25 mg total) by mouth daily. 04/19/23  Yes Bacigalupo, Marzella Schlein, MD   No Known Allergies Review of Systems  Unable to perform ROS   Physical Exam Pulmonary:     Comments: On high flow cannula.  Neurological:     Mental Status: She is alert.     Vital Signs: BP (!) 147/73   Pulse (!) 119   Temp 99 F (37.2 C) (Axillary)   Resp (!) 35   Ht 4\' 3"  (1.295 m)   Wt 46.7 kg   SpO2 92%   BMI 27.83 kg/m  Pain Scale: 0-10   Pain Score: 0-No pain   SpO2: SpO2: 92 % O2 Device:SpO2: 92 % O2 Flow Rate: .O2 Flow Rate (L/min): 50 L/min  IO: Intake/output summary:  Intake/Output Summary (Last 24 hours) at 06/26/2023 1621 Last data filed at 06/26/2023 1500 Gross per 24 hour  Intake 975 ml  Output 800 ml  Net 175 ml    LBM: Last BM Date :  (PTA) Baseline Weight: Weight: 46 kg Most recent weight: Weight: 46.7 kg       Signed by: Morton Stall, NP   Please contact Palliative Medicine Team phone at [phone] for questions and concerns.  For individual provider: See Loretha Stapler

## 2023-06-26 NOTE — Plan of Care (Signed)
  Problem: Respiratory: Goal: Ability to maintain adequate ventilation will improve Outcome: Progressing   Lower oxygen requirements this shift

## 2023-06-26 NOTE — Telephone Encounter (Signed)
Delray Alt can you help explain this to Dr. Virgil Benedict

## 2023-06-26 NOTE — Telephone Encounter (Signed)
noted 

## 2023-06-27 DIAGNOSIS — J849 Interstitial pulmonary disease, unspecified: Secondary | ICD-10-CM | POA: Diagnosis not present

## 2023-06-27 DIAGNOSIS — A419 Sepsis, unspecified organism: Secondary | ICD-10-CM | POA: Diagnosis not present

## 2023-06-27 DIAGNOSIS — J9601 Acute respiratory failure with hypoxia: Secondary | ICD-10-CM | POA: Diagnosis not present

## 2023-06-27 DIAGNOSIS — Z7189 Other specified counseling: Secondary | ICD-10-CM | POA: Diagnosis not present

## 2023-06-27 LAB — CBC
HCT: 33.5 % — ABNORMAL LOW (ref 36.0–46.0)
Hemoglobin: 10.7 g/dL — ABNORMAL LOW (ref 12.0–15.0)
MCH: 23.9 pg — ABNORMAL LOW (ref 26.0–34.0)
MCHC: 31.9 g/dL (ref 30.0–36.0)
MCV: 74.9 fL — ABNORMAL LOW (ref 80.0–100.0)
Platelets: 458 10*3/uL — ABNORMAL HIGH (ref 150–400)
RBC: 4.47 MIL/uL (ref 3.87–5.11)
RDW: 15.9 % — ABNORMAL HIGH (ref 11.5–15.5)
WBC: 19.5 10*3/uL — ABNORMAL HIGH (ref 4.0–10.5)
nRBC: 0 % (ref 0.0–0.2)

## 2023-06-27 LAB — FUNGITELL BETA-D-GLUCAN
Fungitell Value:: <31.25 pg/mL
Result Name:: NEGATIVE

## 2023-06-27 LAB — GLUCOSE, CAPILLARY
Glucose-Capillary: 155 mg/dL — ABNORMAL HIGH (ref 70–99)
Glucose-Capillary: 204 mg/dL — ABNORMAL HIGH (ref 70–99)
Glucose-Capillary: 184 mg/dL — ABNORMAL HIGH (ref 70–99)
Glucose-Capillary: 171 mg/dL — ABNORMAL HIGH (ref 70–99)

## 2023-06-27 LAB — BASIC METABOLIC PANEL
Anion gap: 11 (ref 5–15)
BUN: 19 mg/dL (ref 8–23)
CO2: 25 mmol/L (ref 22–32)
Calcium: 8.7 mg/dL — ABNORMAL LOW (ref 8.9–10.3)
Chloride: 101 mmol/L (ref 98–111)
Creatinine, Ser: 0.75 mg/dL (ref 0.44–1.00)
GFR, Estimated: >60 mL/min (ref >60)
Glucose, Bld: 120 mg/dL — ABNORMAL HIGH (ref 70–99)
Potassium: 4.5 mmol/L (ref 3.5–5.1)
Sodium: 137 mmol/L (ref 135–145)

## 2023-06-27 LAB — C-REACTIVE PROTEIN: CRP: 13.8 mg/dL — ABNORMAL HIGH (ref <1.0)

## 2023-06-27 LAB — ACID FAST SMEAR (AFB, MYCOBACTERIA): Acid Fast Smear: NEGATIVE

## 2023-06-27 LAB — BLOOD GAS, VENOUS
Acid-base deficit: 4.3 mmol/L — ABNORMAL HIGH (ref 0.0–2.0)
Bicarbonate: 21.6 mmol/L (ref 20.0–28.0)
O2 Saturation: 17.6 %
Patient temperature: 37
pCO2, Ven: 42 mm[Hg] — ABNORMAL LOW (ref 44–60)
pH, Ven: 7.32 (ref 7.25–7.43)

## 2023-06-27 LAB — MAGNESIUM: Magnesium: 2.3 mg/dL (ref 1.7–2.4)

## 2023-06-27 LAB — PHOSPHORUS: Phosphorus: 1.3 mg/dL — ABNORMAL LOW (ref 2.5–4.6)

## 2023-06-27 MED ORDER — VITAMIN C 500 MG PO TABS: 500.0000 mg | ORAL_TABLET | Freq: Two times a day (BID) | ORAL | Status: DC

## 2023-06-27 MED ORDER — SODIUM PHOSPHATES 45 MMOLE/15ML IV SOLN
30.0000 mmol | Freq: Once | INTRAVENOUS | Status: AC
  Administered 2023-06-27: 30 mmol via INTRAVENOUS
  Filled 2023-06-27: qty 10

## 2023-06-27 MED ORDER — DEXAMETHASONE SODIUM PHOSPHATE 10 MG/ML IJ SOLN
8.0000 mg | Freq: Two times a day (BID) | INTRAMUSCULAR | Status: DC
  Administered 2023-06-27 – 2023-06-30 (×8): 8 mg via INTRAVENOUS
  Filled 2023-06-27 (×10): qty 0.8

## 2023-06-27 MED ORDER — ALPRAZOLAM 0.25 MG PO TABS
0.2500 mg | ORAL_TABLET | Freq: Once | ORAL | Status: AC
  Administered 2023-06-27: 0.25 mg via ORAL
  Filled 2023-06-27: qty 1

## 2023-06-27 MED ORDER — CHLORHEXIDINE GLUCONATE CLOTH 2 % EX PADS
6.0000 | MEDICATED_PAD | Freq: Every day | CUTANEOUS | Status: DC
  Administered 2023-06-28 – 2023-06-30 (×2): 6 via TOPICAL

## 2023-06-27 MED ORDER — AZITHROMYCIN 250 MG PO TABS
250.0000 mg | ORAL_TABLET | Freq: Every day | ORAL | Status: DC
  Administered 2023-06-27 – 2023-06-28 (×2): 250 mg via ORAL
  Filled 2023-06-27 (×2): qty 1

## 2023-06-27 MED ORDER — FUROSEMIDE 10 MG/ML IJ SOLN
40.0000 mg | Freq: Every day | INTRAMUSCULAR | Status: DC
  Administered 2023-06-27 – 2023-06-30 (×4): 40 mg via INTRAVENOUS
  Filled 2023-06-27 (×4): qty 4

## 2023-06-27 MED ORDER — VITAMIN C 500 MG PO TABS
500.0000 mg | ORAL_TABLET | Freq: Two times a day (BID) | ORAL | Status: DC
Start: 2023-06-28 — End: 2023-07-01
  Administered 2023-06-28 – 2023-06-29 (×4): 500 mg via ORAL
  Filled 2023-06-27 (×4): qty 1

## 2023-06-27 NOTE — Progress Notes (Signed)
PROGRESS NOTE    Erin Wagner  MVH:[id number] DOB: [birth year] DOA: 06/19/2023 PCP: Erasmo Downer, MD   Assessment & Plan:   Principal Problem:   Acute hypoxemic respiratory failure (HCC) Active Problems:   Septic shock (HCC)   Pulmonary hypertension (HCC)   Interstitial lung disease (HCC)   Essential hypertension   Hyponatremia   Hypothyroidism   Myocardial injury   History of breast cancer   Depression   Dyslipidemia   GERD without esophagitis   Uncontrolled type 2 diabetes mellitus with hyperglycemia, without long-term current use of insulin (HCC)   Constipation   Multifocal pneumonia   Iron deficiency anemia  Assessment and Plan:   Sepsis: met criteria w/ tachypnea, leukocytosis & pneumonia. Continue on azithromycin, bronchodilators and encourage incentive spirometry & flutter valve. Pulmon following and recs apprec   Acute hypoxic respiratory failure: secondary to pneumonia. Continue on supplemental oxygen and wean as tolerated. Increased oxygen demand this morning.    Pneumonia: continue on azithromycin, bronchodilators & encourage incentive spirometry & flutter valve. D/c cefepime & steroids. Pulmon following and recs apprec    Hyponatremia: resolved   Elevated troponin: likely secondary to demand ischemia in setting of sepsis    Hypothyroidism: continue on levothyroxine    DM2: well controlled, HbA1c 6.1. Holding home metformin. Continue on SSI w/ accuchecks    Transaminitis: likely secondary to sepsis. No acute findings on CT abd/pelvis. 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 Continue w/ supportive care. Pulmon fibrosis as per CT chest.    GERD: continue on PPI    HLD: not on a statin as per med rec    Depression: severity unknown. Continue on home dose of sertraline      DVT prophylaxis: \lovenox Code  Status: DNR Family Communication: discussed pt's care w/ pt's grandson at bedside and answered his questions  Disposition Plan: likely d/c back home   Level of care: Stepdown Status is: Inpatient Remains inpatient appropriate because: severity of illness    Consultants:  Pulmon   Procedures:   Antimicrobials: azithromycin    Subjective: Pt appears significantly short of breath & uncomfortable    Objective: Vitals:   06/27/23 0800 06/27/23 0825 06/27/23 0900 06/27/23 1000  BP: (!) 191/95  (!) 148/82 (!) 108/53  Pulse: (!) 112  (!) 120 (!) 110  Resp: (!) 26  (!) 24 (!) 45  Temp: 98.9 F (37.2 C)     TempSrc: Axillary     SpO2: 92% (!) 78% (!) 89% 98%  Weight:      Height:        Intake/Output Summary (Last 24 hours) at 06/27/2023 1132 Last data filed at 06/27/2023 1032 Gross per 24 hour  Intake 410 ml  Output 975 ml  Net -565 ml   Filed Weights   06/25/23 0500 06/26/23 0500 06/27/23 0500  Weight: 47.6 kg 46.7 kg 44.6 kg    Examination:  General exam: appears uncomfortable  Respiratory system: course breath sounds b/l  Cardiovascular system: S1/S2+.  Gastrointestinal system: abd is soft, NT, ND & hypoactive bowel sounds  Central nervous system: lethargic.  Psychiatry: Judgement and insight appears at baseline. Flat mood and affect    Data Reviewed: I have personally reviewed following labs and imaging studies  CBC: Recent Labs  Lab 06/21/23 0546 06/22/23 0358 06/23/23 0420 06/24/23 0445 06/25/23 0355 06/26/23 0555 06/27/23 0431  WBC  16.7*   < > 16.4* 16.0* 15.8* 15.0* 19.5*  NEUTROABS 14.0*  --   --   --   --   --   --   HGB 7.7*   < > 7.5* 7.7* 8.0* 10.6* 10.7*  HCT 23.4*   < > 22.8* 23.9* 25.0* 33.4* 33.5*  MCV 75.5*   < > 73.3* 75.4* 75.1* 76.8* 74.9*  PLT 406*   < > 429* 436* 464* 439* 458*   < > = values in this interval not displayed.   Basic Metabolic Panel: Recent Labs  Lab 06/20/23 2326 06/21/23 0546 06/22/23 0358 06/23/23 0420  06/24/23 0445 06/25/23 0355 06/26/23 0555 06/27/23 0431  NA 132* 136   < > 137 135 136 138 137  K 3.7 3.4*   < > 3.6 4.0 3.6 4.5 4.5  CL 100 100   < > 99 97* 98 101 101  CO2 24 27   < > 29 28 29 28 25   GLUCOSE 194* 147*   < > 121* 156* 116* 145* 120*  BUN 19 18   < > 19 21 22 19 19   CREATININE 0.92 0.98   < > 0.82 0.90 0.79 0.74 0.75  CALCIUM 7.9* 8.2*   < > 8.4* 8.5* 8.3* 8.5* 8.7*  MG 1.7 1.7  --   --   --   --  2.4 2.3  PHOS 2.7  --   --   --   --   --  2.3* 1.3*   < > = values in this interval not displayed.   GFR: Estimated Creatinine Clearance: 27.5 mL/min (by C-G formula based on SCr of 0.75 mg/dL). Liver Function Tests: Recent Labs  Lab 06/20/23 1912 06/20/23 2326 06/21/23 0546 06/23/23 0420 06/25/23 0355  AST 144* 168* 173* 84* 32  ALT 117* 130* 139* 144* 83*  ALKPHOS 167* 148* 150* 135* 103  BILITOT 0.9 0.5 0.3 0.6 0.5  PROT 6.6 5.7* 6.1* 6.8 6.2*  ALBUMIN 2.7* 2.3* 2.6* 2.6* 2.5*   No results for input(s): "LIPASE", "AMYLASE" in the last 168 hours. No results for input(s): "AMMONIA" in the last 168 hours. Coagulation Profile: No results for input(s): "INR", "PROTIME" in the last 168 hours.  Cardiac Enzymes: No results for input(s): "CKTOTAL", "CKMB", "CKMBINDEX", "TROPONINI" in the last 168 hours. BNP (last 3 results) No results for input(s): "PROBNP" in the last 8760 hours. HbA1C: No results for input(s): "HGBA1C" in the last 72 hours. CBG: Recent Labs  Lab 06/26/23 1200 06/26/23 1624 06/26/23 2121 06/27/23 0813 06/27/23 1123  GLUCAP 183* 256* 102* 155* 204*   Lipid Profile: No results for input(s): "CHOL", "HDL", "LDLCALC", "TRIG", "CHOLHDL", "LDLDIRECT" in the last 72 hours. Thyroid Function Tests: No results for input(s): "TSH", "T4TOTAL", "FREET4", "T3FREE", "THYROIDAB" in the last 72 hours. Anemia Panel: Recent Labs    06/24/23 1801  TIBC 309  IRON 20*   Sepsis Labs: Recent Labs  Lab 06/20/23 1912 06/20/23 2127 06/20/23 2325   LATICACIDVEN 3.2* 1.5 1.5    Recent Results (from the past 240 hours)  Blood Culture (routine x 2)     Status: None   Collection Time: 06/19/23  7:19 PM   Specimen: BLOOD  Result Value Ref Range Status   Specimen Description BLOOD BLOOD LEFT ARM  Final   Special Requests   Final    BOTTLES DRAWN AEROBIC AND ANAEROBIC Blood Culture adequate volume   Culture   Final    NO GROWTH 5 DAYS Performed at [facility], [address], [place], [place] [address]    Report Status 06/24/2023 FINAL  Final  Blood Culture (routine x 2)     Status: None   Collection Time: 06/19/23  7:19 PM   Specimen: BLOOD  Result Value Ref Range Status   Specimen Description BLOOD BLOOD RIGHT ARM  Final   Special Requests   Final    BOTTLES DRAWN AEROBIC AND ANAEROBIC Blood Culture adequate volume   Culture   Final    NO GROWTH 5 DAYS Performed at [facility], [address], [place], [place] [address]    Report Status 06/24/2023 FINAL  Final  Resp panel by RT-PCR (RSV, Flu A&B, Covid) Anterior Nasal Swab     Status: None   Collection Time: 06/19/23  8:03 PM   Specimen: Anterior Nasal Swab  Result Value Ref Range Status   SARS Coronavirus 2 by RT PCR NEGATIVE NEGATIVE Final    Comment: (NOTE) SARS-CoV-2 target nucleic acids are NOT DETECTED.  The SARS-CoV-2 RNA is generally detectable in upper respiratory specimens during the acute phase of infection. The lowest concentration of SARS-CoV-2 viral copies this assay can detect is 138 copies/mL. A negative result does not preclude SARS-Cov-2 infection and should not be used as the sole basis for treatment or other patient management decisions. A negative result may occur with  improper specimen collection/handling, submission of specimen other than nasopharyngeal swab, presence of viral mutation(s) within the areas targeted by this assay, and inadequate number of viral copies(<138 copies/mL). A negative result must be combined  with clinical observations, patient history, and epidemiological information. The expected result is Negative.  Fact Sheet for Patients:  BloggerCourse.com  Fact Sheet for Healthcare Providers:  SeriousBroker.it  This test is no t yet approved or cleared by the [place] FDA and  has been authorized for detection and/or diagnosis of SARS-CoV-2 by FDA under an Emergency Use Authorization (EUA). This EUA will remain  in effect (meaning this test can be used) for the duration of the COVID-19 declaration under Section 564(b)(1) of the Act, 21 U.S.C.section 360bbb-3(b)(1), unless the authorization is terminated  or revoked sooner.       Influenza A by PCR NEGATIVE NEGATIVE Final   Influenza B by PCR NEGATIVE NEGATIVE Final    Comment: (NOTE) The Xpert Xpress SARS-CoV-2/FLU/RSV plus assay is intended as an aid in the diagnosis of influenza from Nasopharyngeal swab specimens and should not be used as a sole basis for treatment. Nasal washings and aspirates are unacceptable for Xpert Xpress SARS-CoV-2/FLU/RSV testing.  Fact Sheet for Patients: BloggerCourse.com  Fact Sheet for Healthcare Providers: SeriousBroker.it  This test is not yet approved or cleared by the [place] FDA and has been authorized for detection and/or diagnosis of SARS-CoV-2 by FDA under an Emergency Use Authorization (EUA). This EUA will remain in effect (meaning this test can be used) for the duration of the COVID-19 declaration under Section 564(b)(1) of the Act, 21 U.S.C. section 360bbb-3(b)(1), unless the authorization is terminated or revoked.     Resp Syncytial Virus by PCR NEGATIVE NEGATIVE Final    Comment: (NOTE) Fact Sheet for Patients: BloggerCourse.com  Fact Sheet for Healthcare Providers: SeriousBroker.it  This test is not yet approved  or cleared by the [place] FDA and has been authorized for detection and/or diagnosis of SARS-CoV-2 by FDA under an Emergency Use Authorization (EUA). This EUA will remain in effect (meaning this test can be used) for the duration of the COVID-19 declaration  under Section 564(b)(1) of the Act, 21 U.S.C. section 360bbb-3(b)(1), unless the authorization is terminated or revoked.  Performed at [facility], [address] [place], [place] [address]   Respiratory (~20 pathogens) panel by PCR     Status: None   Collection Time: 06/20/23  9:28 AM   Specimen: Nasopharyngeal Swab; Respiratory  Result Value Ref Range Status   Adenovirus NOT DETECTED NOT DETECTED Final   Coronavirus 229E NOT DETECTED NOT DETECTED Final    Comment: (NOTE) The Coronavirus on the Respiratory Panel, DOES NOT test for the novel  Coronavirus (2019 nCoV)    Coronavirus HKU1 NOT DETECTED NOT DETECTED Final   Coronavirus NL63 NOT DETECTED NOT DETECTED Final   Coronavirus OC43 NOT DETECTED NOT DETECTED Final   Metapneumovirus NOT DETECTED NOT DETECTED Final   Rhinovirus / Enterovirus NOT DETECTED NOT DETECTED Final   Influenza A NOT DETECTED NOT DETECTED Final   Influenza B NOT DETECTED NOT DETECTED Final   Parainfluenza Virus 1 NOT DETECTED NOT DETECTED Final   Parainfluenza Virus 2 NOT DETECTED NOT DETECTED Final   Parainfluenza Virus 3 NOT DETECTED NOT DETECTED Final   Parainfluenza Virus 4 NOT DETECTED NOT DETECTED Final   Respiratory Syncytial Virus NOT DETECTED NOT DETECTED Final   Bordetella pertussis NOT DETECTED NOT DETECTED Final   Bordetella Parapertussis NOT DETECTED NOT DETECTED Final   Chlamydophila pneumoniae NOT DETECTED NOT DETECTED Final   Mycoplasma pneumoniae NOT DETECTED NOT DETECTED Final    Comment: Performed at [facility], [address], [place], [place] [address]  MRSA Next Gen by PCR, Nasal     Status: None   Collection Time: 06/20/23  9:28 AM   Specimen:  Nasopharyngeal Swab; Nasal Swab  Result Value Ref Range Status   MRSA by PCR Next Gen NOT DETECTED NOT DETECTED Final    Comment: (NOTE) The GeneXpert MRSA Assay (FDA approved for NASAL specimens only), is one component of a comprehensive MRSA colonization surveillance program. It is not intended to diagnose MRSA infection nor to guide or monitor treatment for MRSA infections. Test performance is not FDA approved in patients less than 52 years old. Performed at [facility], [address] [place], [place] [address]   Aspergillus Ag, BAL/Serum     Status: None   Collection Time: 06/23/23 11:37 AM   Specimen: Vein  Result Value Ref Range Status   Aspergillus Ag, BAL/Serum 0.04 0.00 - 0.49 Index Final    Comment: (NOTE) Performed At: [facility] [address] [place], [place] [id number] Jolene Schimke MD OZ:[id number]   Expectorated Sputum Assessment w Gram Stain, Rflx to Resp Cult     Status: None   Collection Time: 06/25/23  9:44 AM   Specimen: SPU  Result Value Ref Range Status   Specimen Description SPUTUM  Final   Special Requests NONE  Final   Sputum evaluation   Final    THIS SPECIMEN IS ACCEPTABLE FOR SPUTUM CULTURE Performed at [facility], [address], [place], [place] [address]    Report Status 06/25/2023 FINAL  Final  Culture, Respiratory w Gram Stain     Status: None (Preliminary result)   Collection Time: 06/25/23  9:44 AM   Specimen: SPU  Result Value Ref Range Status   Specimen Description   Final    SPUTUM Performed at [facility], [address] [place], [place] [address]    Special Requests   Final    NONE Performed at [facility], [address] [place], [place] [address]    Gram Stain NO WBC SEEN RARE GRAM POSITIVE COCCI IN PAIRS   Final   Culture   Final    CULTURE REINCUBATED FOR BETTER GROWTH Performed at [facility], [address], [place], [place] [address]    Report Status  PENDING  Incomplete         Radiology Studies: DG Chest Port 1 View Result Date: 06/26/2023 CLINICAL DATA:  Follow-up edema EXAM: PORTABLE CHEST 1 VIEW COMPARISON:  06/23/2023, CT 06/24/2023, 06/19/2023, 08/27/2022 FINDINGS: Stable cardiomediastinal silhouette. Underlying chronic interstitial lung disease. Overall no significant interval change in appearance of the chest since 06/23/2023 with widespread mild diffuse interstitial and ground-glass opacity. Aortic atherosclerosis. No pleural effusion or pneumothorax. IMPRESSION: No significant interval change in appearance of the chest since 06/23/2023. Underlying chronic interstitial lung disease and fibrosis with suspected superimposed mild diffuse interstitial and ground-glass opacity. Cardiomegaly. Electronically Signed   By: Jasmine Pang M.D.   On: 06/26/2023 19:30        Scheduled Meds:  [START ON 06/28/2023] ascorbic acid  500 mg Oral BID   azithromycin  250 mg Oral Daily   bisacodyl  5 mg Oral Once   Chlorhexidine Gluconate Cloth  6 each Topical Daily   dexamethasone (DECADRON) injection  8 mg Intravenous Q12H   docusate sodium  200 mg Oral BID   enoxaparin (LOVENOX) injection  30 mg Subcutaneous Q24H   feeding supplement (KATE FARMS STANDARD 1.4)  325 mL Oral BID BM   ferrous sulfate  325 mg Oral BID WC   furosemide  40 mg Intravenous Daily   guaiFENesin  600 mg Oral BID   insulin aspart  0-15 Units Subcutaneous TID AC & HS   ipratropium-albuterol  3 mL Nebulization QID   levothyroxine  25 mcg Oral Q0600   pantoprazole  40 mg Oral Daily   sertraline  25 mg Oral Daily   Continuous Infusions:  sodium PHOSPHATE IVPB (in mmol) 30 mmol (06/27/23 1003)     LOS: 8 days      Charise Killian, MD [facility] Pager [phone]  If 7PM-7AM, please contact night-coverage [email or web address] 06/27/2023, 11:32 AM

## 2023-06-27 NOTE — Progress Notes (Signed)
Daily Progress Note   Patient Name: Erin Wagner       Date: 06/27/2023 DOB: 1940/10/22  Age: 82 y.o. MRN#: [id number] Attending Physician: Charise Killian, MD Primary Care Physician: Erasmo Downer, MD Admit Date: 06/19/2023  Reason for Consultation/Follow-up: Establishing goals of care  Subjective: Notes and labs reviewed.  Into see patient.  She is currently resting in bed with eyes closed on high flow cannula.  Her son, daughter-in-law, and grandson are at bedside.  They state she was alert and talkative yesterday, but overnight required increased oxygen, and this morning was able to wean somewhat, but has not returned to the level required yesterday.  They discuss that today she has been more restless, and not really talking and not eating and drinking.  We discussed her diagnoses, prognosis, GOC, EOL wishes disposition and options.  Created space and opportunity for patient  to explore thoughts and feelings regarding current medical information.   A detailed discussion was had today regarding advanced directives.  Concepts specific to code status, artifical feeding and hydration, IV antibiotics and rehospitalization were discussed.  The difference between an aggressive medical intervention path and a comfort care path was discussed.  Values and goals of care important to patient and family were attempted to be elicited.  Discussed limitations of medical interventions to prolong quality of life in some situations and discussed the concept of human mortality.  They discuss that they were hopeful to get her back to [place] if possible, but she has not really expressed a desire to return to [place] herself.  They state they would like for her to make decisions on care moving  for herself but state she will typically give decision making to her son.   Questions answered regarding comfort care and hospital death.  Questions answered regarding care moving forward with life-prolonging measures.  Will need time for outcomes.  Length of Stay: 8  Current Medications: Scheduled Meds:   [START ON 06/28/2023] ascorbic acid  500 mg Oral BID   azithromycin  250 mg Oral Daily   bisacodyl  5 mg Oral Once   Chlorhexidine Gluconate Cloth  6 each Topical Daily   dexamethasone (DECADRON) injection  8 mg Intravenous Q12H   docusate sodium  200 mg Oral BID   enoxaparin (LOVENOX) injection  30 mg  Subcutaneous Q24H   feeding supplement (KATE FARMS STANDARD 1.4)  325 mL Oral BID BM   ferrous sulfate  325 mg Oral BID WC   furosemide  40 mg Intravenous Daily   guaiFENesin  600 mg Oral BID   insulin aspart  0-15 Units Subcutaneous TID AC & HS   ipratropium-albuterol  3 mL Nebulization QID   levothyroxine  25 mcg Oral Q0600   pantoprazole  40 mg Oral Daily   sertraline  25 mg Oral Daily    Continuous Infusions:  sodium PHOSPHATE IVPB (in mmol) 43 mL/hr at 06/27/23 1200    PRN Meds: acetaminophen **OR** acetaminophen, bisacodyl, magnesium hydroxide, [DISCONTINUED] ondansetron **OR** ondansetron (ZOFRAN) IV, mouth rinse, sodium chloride, traZODone  Physical Exam Constitutional:      Comments: Eyes closed.  Appears restless.  Pulmonary:     Comments: On high flow cannula.            Vital Signs: BP (!) 117/56   Pulse (!) 107   Temp 99.2 F (37.3 C) (Axillary)   Resp (!) 32   Ht 4\' 3"  (1.295 m)   Wt 44.6 kg   SpO2 95%   BMI 26.58 kg/m  SpO2: SpO2: 95 % O2 Device: O2 Device: Heated High Flow Nasal Cannula O2 Flow Rate: O2 Flow Rate (L/min): 40 L/min  Intake/output summary:  Intake/Output Summary (Last 24 hours) at 06/27/2023 1510 Last data filed at 06/27/2023 1200 Gross per 24 hour  Intake 814.08 ml  Output 1350 ml  Net -535.92 ml   LBM: Last BM Date :  06/27/23 Baseline Weight: Weight: 46 kg Most recent weight: Weight: 44.6 kg         Patient Active Problem List   Diagnosis Date Noted   Pulmonary hypertension (HCC) 06/24/2023   Iron deficiency anemia 06/24/2023   Septic shock (HCC) 06/23/2023   Myocardial injury 06/23/2023   Uncontrolled type 2 diabetes mellitus with hyperglycemia, without long-term current use of insulin (HCC) 06/23/2023   Constipation 06/23/2023   Multifocal pneumonia 06/23/2023   Hyponatremia 06/20/2023   Essential hypertension 06/19/2023   Dyslipidemia 06/19/2023   GERD without esophagitis 06/19/2023   Interstitial lung disease (HCC) 06/19/2023   Acute hypoxemic respiratory failure (HCC) 06/19/2023   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   Urge incontinence 04/23/2023   ILD (interstitial lung disease) (HCC) 01/15/2023   Gait difficulty 01/15/2023   Chronic cough 12/06/2022   Bibasilar crackles 08/27/2022   Localized 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 fracture 02/22/2022   Hypothyroidism 05/13/2019   Essential tremor 05/13/2019   Allergic conjunctivitis of both eyes 05/13/2019   Lymphedema 12/13/2017   History of cataract 09/09/2017   Depression 09/09/2017   History of breast cancer 06/27/2017   Diabetes mellitus (HCC) 06/27/2017   Hyperlipidemia associated with type 2 diabetes mellitus (HCC) 06/27/2017   GERD (gastroesophageal reflux disease) 06/27/2017   Hypertension associated with diabetes (HCC) 06/27/2017    Palliative Care Assessment & Plan   Recommendations/Plan:  Would recommend adding a small dose of scheduled Xanax for anxiety.  Would consider adding small dose of PRN morphine for shortness of breath. Time for outcomes.  Family is aware of very poor prognosis. Discussed that in any event, patient likely will not be strong enough to return to [place].  Code Status:    Code Status Orders  (From admission, onward)           Start     Ordered   06/23/23 1818  Do  not attempt resuscitation (  DNR)- Limited -Do Not Intubate (DNI)  (Code Status)  Continuous       Question Answer Comment  If pulseless and not breathing No CPR or chest compressions.   In Pre-Arrest Conditions (Patient Is Breathing and Has A Pulse) Do not intubate. Provide all appropriate non-invasive medical interventions. Avoid ICU transfer unless indicated or required.   Consent: Discussion documented in EHR or advanced directives reviewed      06/23/23 1817           Code Status History     Date Active Date Inactive Code Status Order ID Comments User Context   06/20/2023 0521 06/23/2023 1816 Do not attempt resuscitation (DNR) PRE-ARREST INTERVENTIONS DESIRED [id number]  Hannah Beat, MD ED   06/19/2023 2135 06/20/2023 0521 Full Code [id number]  Mansy, Vernetta Honey, MD ED       Care plan was discussed with CCM  Thank you for allowing the Palliative Medicine Team to assist in the care of this patient.   Morton Stall, NP  Please contact Palliative Medicine Team phone at [phone] for questions and concerns.

## 2023-06-27 NOTE — Progress Notes (Signed)
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    Pertinent  Medical History   Patient's past medical history includes breast cancer status post bilateral mastectomy and chemoradiation. She also has a history of depression and anxiety  Interim History / Subjective:   06/25/23- Did develop increased shortness of breath yesterday she is getting worse and is at high risk of death currently on maximal settings on HFNC 95%/60L/min .  Reviewed imaging with family  06/26/23- patient is improved, s/p pRBC transfusion now more stable able to speak and have weaned O2 to 80%.  Provided plant based diet. Lengthy discussion with family.  Palliative care consultation ordered.  06/27/23- patient had first meal after week of being on 100%HFNC and non rebreather.  After interval improvement and nourishemnent had developed electrolyte distrubances consistent with re-feeding.  Pharmacy consultation and repletion in process. She is on 85%HFNC without non-rebreather.  She is conversive.  Family at bedside I met with them few times today. Palliative meeting in process I encouraged patient to continue to work towards improvement as she expressed wishes to hopefully go back to [place] to see remainder of her family once more. We will continue to support her maximally on medical management.    Objective   Blood pressure 128/68, pulse (!) 105, temperature 99.2 F (37.3 C), temperature source Axillary, resp. rate (!) 40, height 4\' 3"  (1.295 m), weight 44.6 kg, SpO2 93%.    FiO2 (%):  [65 %-96 %] 82 %   Intake/Output Summary (Last 24 hours) at 06/27/2023 1358 Last data filed at 06/27/2023 1200 Gross per 24 hour  Intake 914.08 ml  Output 1350 ml  Net -435.92 ml   Filed  Weights   06/25/23 0500 06/26/23 0500 06/27/23 0500  Weight: 47.6 kg 46.7 kg 44.6 kg    Examination: Physical Exam Constitutional:      General: She is not in acute distress.    Appearance: She is ill-appearing.  Cardiovascular:     Rate and Rhythm: Normal rate and regular rhythm.  Pulmonary:     Effort: Respiratory distress present.     Breath sounds: Rhonchi and rales present. No wheezing.  Neurological:     General: No focal deficit present.     Mental Status: She is alert. Mental status is at baseline.     Motor: Weakness present.      IMAGING     REVIEWED BY ME INDEPENDENTLY     Assessment & Plan:   #Acute on Chronic Hypoxic Respiratory Failure #ILD? Vs bronchiectasis due to atypical mycobacterial/ endemic infection         - need to perform infectious workup -  -Respiratory viral panel- negative -serum fungitell -legionella ab -strep pneumoniae ur AG -Histoplasma Ur Ag -sputum resp cultures -AFB sputum expectorated specimen -reviewed pertinent imaging with patient today - ESR, CRP trend  -please encourage patient to use incentive spirometer few times each hour while hospitalized.   -MRSA screening Currently empirically on and zithromax, will change to cefepime and zithromax due to bronchiectasis  -currently on HFNC with weaning now on 80%/50 from 100%/60 yesterday -continue steroids , weaned to 40 daily but noted CRP has incremented and we have increased steroids back up and will continue to wean as  able. Met with family multiple times today.   Type 2 NSTEMI  Need to rule out Acute PE , severe elevation of BNP.   -S/pTTE with no RV failure/McCollum sign - VQ scan today  -active cardiac disease   Severe microcytic anemia  -due to iron deficiency - definitely contributing to dyspnea and should be repleted -Feosol and vitamin C today  -s/p prbc transfusion   Moderate protein calorie malnutrition  - bitemporal wasting and peipheral muscle  wasting -had RD nutritional evaluation  -s/p vegan nourishment -   Refeeding syndrome  - patient with severe electrolyte derrangement- repletion in process   Labs   CBC: Recent Labs  Lab 06/21/23 0546 06/22/23 0358 06/23/23 0420 06/24/23 0445 06/25/23 0355 06/26/23 0555 06/27/23 0431  WBC 16.7*   < > 16.4* 16.0* 15.8* 15.0* 19.5*  NEUTROABS 14.0*  --   --   --   --   --   --   HGB 7.7*   < > 7.5* 7.7* 8.0* 10.6* 10.7*  HCT 23.4*   < > 22.8* 23.9* 25.0* 33.4* 33.5*  MCV 75.5*   < > 73.3* 75.4* 75.1* 76.8* 74.9*  PLT 406*   < > 429* 436* 464* 439* 458*   < > = values in this interval not displayed.    Basic Metabolic Panel: Recent Labs  Lab 06/20/23 2326 06/21/23 0546 06/22/23 0358 06/23/23 0420 06/24/23 0445 06/25/23 0355 06/26/23 0555 06/27/23 0431  NA 132* 136   < > 137 135 136 138 137  K 3.7 3.4*   < > 3.6 4.0 3.6 4.5 4.5  CL 100 100   < > 99 97* 98 101 101  CO2 24 27   < > 29 28 29 28 25   GLUCOSE 194* 147*   < > 121* 156* 116* 145* 120*  BUN 19 18   < > 19 21 22 19 19   CREATININE 0.92 0.98   < > 0.82 0.90 0.79 0.74 0.75  CALCIUM 7.9* 8.2*   < > 8.4* 8.5* 8.3* 8.5* 8.7*  MG 1.7 1.7  --   --   --   --  2.4 2.3  PHOS 2.7  --   --   --   --   --  2.3* 1.3*   < > = values in this interval not displayed.   GFR: Estimated Creatinine Clearance: 27.5 mL/min (by C-G formula based on SCr of 0.75 mg/dL). Recent Labs  Lab 06/20/23 1912 06/20/23 2127 06/20/23 2325 06/20/23 2326 06/24/23 0445 06/25/23 0355 06/26/23 0555 06/27/23 0431  WBC  --   --   --    < > 16.0* 15.8* 15.0* 19.5*  LATICACIDVEN 3.2* 1.5 1.5  --   --   --   --   --    < > = values in this interval not displayed.    Liver Function Tests: Recent Labs  Lab 06/20/23 1912 06/20/23 2326 06/21/23 0546 06/23/23 0420 06/25/23 0355  AST 144* 168* 173* 84* 32  ALT 117* 130* 139* 144* 83*  ALKPHOS 167* 148* 150* 135* 103  BILITOT 0.9 0.5 0.3 0.6 0.5  PROT 6.6 5.7* 6.1* 6.8 6.2*  ALBUMIN 2.7*  2.3* 2.6* 2.6* 2.5*   No results for input(s): "LIPASE", "AMYLASE" in the last 168 hours. No results for input(s): "AMMONIA" in the last 168 hours.  ABG    Component Value Date/Time   PHART 7.3 (L) 06/20/2023 1829   PCO2ART 30 (L) 06/20/2023 1829   PO2ART 69 (  L) 06/20/2023 1829   HCO3 27.8 06/21/2023 0546   ACIDBASEDEF 10.3 (H) 06/20/2023 1829   O2SAT 86.9 06/21/2023 0546     Coagulation Profile: No results for input(s): "INR", "PROTIME" in the last 168 hours.   Cardiac Enzymes: No results for input(s): "CKTOTAL", "CKMB", "CKMBINDEX", "TROPONINI" in the last 168 hours.  HbA1C: Hgb A1c MFr Bld  Date/Time Value Ref Range Status  02/18/2023 03:43 PM 6.1 (H) 4.8 - 5.6 % Final    Comment:             Prediabetes: 5.7 - 6.4          Diabetes: >6.4          Glycemic control for adults with diabetes: <7.0   08/27/2022 04:35 PM 6.6 (H) 4.8 - 5.6 % Final    Comment:             Prediabetes: 5.7 - 6.4          Diabetes: >6.4          Glycemic control for adults with diabetes: <7.0     CBG: Recent Labs  Lab 06/26/23 1200 06/26/23 1624 06/26/23 2121 06/27/23 0813 06/27/23 1123  GLUCAP 183* 256* 102* 155* 204*    Past Medical History:  She,  has a past medical history of Breast cancer (HCC), Cataract, Diabetes mellitus without complication (HCC), Hyperlipidemia, Hypertension, and Thyroid disease.   Surgical History:   Past Surgical History:  Procedure Laterality Date   BREAST SURGERY     EYE SURGERY       Social History:   reports that she has never smoked. She has never used smokeless tobacco. She reports that she does not drink alcohol and does not use drugs.   Family History:  Her family history includes Healthy in her brother, daughter, daughter, daughter, sister, and sister.   Allergies No Known Allergies   Home Medications  Prior to Admission medications   Medication Sig Start Date End Date Taking? Authorizing Provider  alendronate (FOSAMAX) 70 MG  tablet Take 1 tablet (70 mg total) by mouth every 7 (seven) days. Take with a full glass of water on an empty stomach. 02/05/23  Yes Simmons-Robinson, Makiera, MD  atorvastatin (LIPITOR) 20 MG tablet Take 1 tablet (20 mg total) by mouth daily. 08/27/22  Yes Bacigalupo, Marzella Schlein, MD  budesonide (PULMICORT) 0.5 MG/2ML nebulizer solution Take 2 mLs (0.5 mg total) by nebulization in the morning and at bedtime. 06/18/23 06/17/24 Yes Dgayli, Lianne Bushy, MD  Calcium Carbonate-Vitamin D 600-400 MG-UNIT tablet Take 1 tablet by mouth daily.    Yes [provider]  famotidine (PEPCID) 20 MG tablet Take 1 tablet (20 mg total) by mouth 2 (two) times daily. 01/01/23 01/01/24 Yes Bacigalupo, Marzella Schlein, MD  loratadine (CLARITIN) 10 MG tablet Take 1 tablet (10 mg total) by mouth daily. 04/08/23 04/07/24 Yes Dgayli, Lianne Bushy, MD  metFORMIN (GLUCOPHAGE) 500 MG tablet Take 1 tablet (500 mg total) by mouth 2 (two) times daily with a meal. 03/14/23  Yes Bacigalupo, Marzella Schlein, MD  omeprazole (PRILOSEC) 20 MG capsule Take 1 capsule (20 mg total) by mouth daily. 06/05/23  Yes Jacky Kindle, FNP  propranolol (INDERAL) 20 MG tablet Take 1 tablet (20 mg total) by mouth 2 (two) times daily. 04/22/23 10/19/23 Yes Bacigalupo, Marzella Schlein, MD  sertraline (ZOLOFT) 25 MG tablet Take 1 tablet (25 mg total) by mouth daily. 04/19/23  Yes Bacigalupo, Marzella Schlein, MD     Critical care provider statement:  Total critical care time: 33 minutes   Performed by: Karna Christmas MD   Critical care time was exclusive of separately billable procedures and treating other patients.   Critical care was necessary to treat or prevent imminent or life-threatening deterioration.   Critical care was time spent personally by me on the following activities: development of treatment plan with patient and/or surrogate as well as nursing, discussions with consultants, evaluation of patient's response to treatment, examination of patient, obtaining history from patient or  surrogate, ordering and performing treatments and interventions, ordering and review of laboratory studies, ordering and review of radiographic studies, pulse oximetry and re-evaluation of patient's condition.    Vida Rigger, M.D.  Pulmonary & Critical Care Medicine

## 2023-06-27 NOTE — Progress Notes (Signed)
Pt. easily desat on little exertion that we have to place 100 % NRB mask on top of the heated high flow just to keep O2 sat above 90, Closely monitor and respiratory made aware.

## 2023-06-27 NOTE — Plan of Care (Signed)
  Problem: Fluid Volume: Goal: Hemodynamic stability will improve Outcome: Progressing   Problem: Clinical Measurements: Goal: Signs and symptoms of infection will decrease Outcome: Progressing   Problem: Tissue Perfusion: Goal: Adequacy of tissue perfusion will improve Outcome: Progressing   Problem: Clinical Measurements: Goal: Will remain free from infection Outcome: Progressing

## 2023-06-27 NOTE — Plan of Care (Signed)
  Problem: Tissue Perfusion: Goal: Adequacy of tissue perfusion will improve Outcome: Progressing   Problem: Clinical Measurements: Goal: Cardiovascular complication will be avoided Outcome: Progressing   Problem: Coping: Goal: Level of anxiety will decrease Outcome: Progressing   Problem: Elimination: Goal: Will not experience complications related to bowel motility Outcome: Progressing Goal: Will not experience complications related to urinary retention Outcome: Progressing   Problem: Pain Management: Goal: General experience of comfort will improve Outcome: Progressing   Problem: Nutritional: Goal: Maintenance of adequate nutrition will improve Outcome: Not Progressing   Problem: Nutrition: Goal: Adequate nutrition will be maintained Outcome: Not Progressing

## 2023-06-27 NOTE — Progress Notes (Signed)
PHARMACY CONSULT NOTE - FOLLOW UP  Pharmacy Consult for Electrolyte Monitoring and Replacement   Recent Labs: Potassium (mmol/L)  Date Value  06/27/2023 4.5   Magnesium (mg/dL)  Date Value  62/95/2841 2.3   Calcium (mg/dL)  Date Value  32/44/0102 8.7 (L)   Albumin (g/dL)  Date Value  72/53/6644 2.5 (L)  02/18/2023 3.9   Phosphorus (mg/dL)  Date Value  03/47/4259 1.3 (L)   Sodium (mmol/L)  Date Value  06/27/2023 137  02/18/2023 129 (L)     Assessment: 82 y/o female with h/o breast cancer, depression, hypothyroid, HTN, HLD, GERD, ILD, IDA and pulmonary hypertension who is admitted with PNA, sepsis and MI.   Diuretics: furosemide 40 mg IV once daily  Goal of Therapy:  Electrolytes WNL  Plan:  ---30 mmol IV sodium phosphate x 1 ---recheck electrolytes in am  Lowella Bandy ,PharmD Clinical Pharmacist 06/27/2023 7:20 AM

## 2023-06-28 ENCOUNTER — Other Ambulatory Visit: Payer: Self-pay

## 2023-06-28 DIAGNOSIS — A419 Sepsis, unspecified organism: Secondary | ICD-10-CM | POA: Diagnosis not present

## 2023-06-28 DIAGNOSIS — J849 Interstitial pulmonary disease, unspecified: Secondary | ICD-10-CM | POA: Diagnosis not present

## 2023-06-28 DIAGNOSIS — J9601 Acute respiratory failure with hypoxia: Secondary | ICD-10-CM | POA: Diagnosis not present

## 2023-06-28 DIAGNOSIS — Z7189 Other specified counseling: Secondary | ICD-10-CM | POA: Diagnosis not present

## 2023-06-28 LAB — CBC
HCT: 36.4 % (ref 36.0–46.0)
Hemoglobin: 11.4 g/dL — ABNORMAL LOW (ref 12.0–15.0)
MCH: 23.8 pg — ABNORMAL LOW (ref 26.0–34.0)
MCHC: 31.3 g/dL (ref 30.0–36.0)
MCV: 75.8 fL — ABNORMAL LOW (ref 80.0–100.0)
Platelets: 474 10*3/uL — ABNORMAL HIGH (ref 150–400)
RBC: 4.8 MIL/uL (ref 3.87–5.11)
RDW: 16.2 % — ABNORMAL HIGH (ref 11.5–15.5)
WBC: 18.1 10*3/uL — ABNORMAL HIGH (ref 4.0–10.5)
nRBC: 0 % (ref 0.0–0.2)

## 2023-06-28 LAB — BASIC METABOLIC PANEL
Anion gap: 11 (ref 5–15)
BUN: 24 mg/dL — ABNORMAL HIGH (ref 8–23)
CO2: 28 mmol/L (ref 22–32)
Calcium: 8.3 mg/dL — ABNORMAL LOW (ref 8.9–10.3)
Chloride: 100 mmol/L (ref 98–111)
Creatinine, Ser: 0.75 mg/dL (ref 0.44–1.00)
GFR, Estimated: >60 mL/min (ref >60)
Glucose, Bld: 163 mg/dL — ABNORMAL HIGH (ref 70–99)
Potassium: 4.7 mmol/L (ref 3.5–5.1)
Sodium: 139 mmol/L (ref 135–145)

## 2023-06-28 LAB — MAGNESIUM: Magnesium: 2.6 mg/dL — ABNORMAL HIGH (ref 1.7–2.4)

## 2023-06-28 LAB — GLUCOSE, CAPILLARY
Glucose-Capillary: 178 mg/dL — ABNORMAL HIGH (ref 70–99)
Glucose-Capillary: 177 mg/dL — ABNORMAL HIGH (ref 70–99)
Glucose-Capillary: 194 mg/dL — ABNORMAL HIGH (ref 70–99)
Glucose-Capillary: 164 mg/dL — ABNORMAL HIGH (ref 70–99)

## 2023-06-28 LAB — HISTOPLASMA ANTIGEN, URINE: Histoplasma Antigen, urine: NEGATIVE (ref <0.2)

## 2023-06-28 LAB — C-REACTIVE PROTEIN: CRP: 17.9 mg/dL — ABNORMAL HIGH (ref <1.0)

## 2023-06-28 LAB — PHOSPHORUS: Phosphorus: 3.7 mg/dL (ref 2.5–4.6)

## 2023-06-28 MED ORDER — ALPRAZOLAM 0.25 MG PO TABS
0.2500 mg | ORAL_TABLET | Freq: Once | ORAL | Status: AC
  Administered 2023-06-28: 0.25 mg via ORAL
  Filled 2023-06-28: qty 1

## 2023-06-28 MED ORDER — METOPROLOL TARTRATE 5 MG/5ML IV SOLN
2.5000 mg | INTRAVENOUS | Status: DC | PRN
  Administered 2023-06-28 – 2023-06-29 (×3): 2.5 mg via INTRAVENOUS
  Filled 2023-06-28 (×3): qty 5

## 2023-06-28 MED ORDER — SODIUM CHLORIDE 0.9 % IV SOLN
2.0000 g | INTRAVENOUS | Status: DC
  Filled 2023-06-28: qty 2

## 2023-06-28 MED ORDER — MORPHINE SULFATE (PF) 2 MG/ML IV SOLN
0.5000 mg | Freq: Once | INTRAVENOUS | Status: AC
  Administered 2023-06-28: 0.5 mg via INTRAVENOUS
  Filled 2023-06-28: qty 1

## 2023-06-28 MED ORDER — CLONIDINE HCL 0.1 MG/24HR TD PTWK
0.1000 mg | MEDICATED_PATCH | TRANSDERMAL | Status: DC
  Administered 2023-06-28: 0.1 mg via TRANSDERMAL
  Filled 2023-06-28: qty 1

## 2023-06-28 MED ORDER — LEVOFLOXACIN IN D5W 750 MG/150ML IV SOLN
750.0000 mg | INTRAVENOUS | Status: DC
  Administered 2023-06-28 – 2023-06-30 (×2): 750 mg via INTRAVENOUS
  Filled 2023-06-28 (×2): qty 150

## 2023-06-28 NOTE — Plan of Care (Signed)
  Problem: Fluid Volume: Goal: Ability to maintain a balanced intake and output will improve Outcome: Not Progressing   Problem: Metabolic: Goal: Ability to maintain appropriate glucose levels will improve Outcome: Not Progressing   Problem: Nutritional: Goal: Maintenance of adequate nutrition will improve Outcome: Not Progressing   Problem: Skin Integrity: Goal: Risk for impaired skin integrity will decrease Outcome: Not Progressing

## 2023-06-28 NOTE — Progress Notes (Signed)
NAME:  Erin Wagner, MRN:  [id number], DOB:  02-Jan-1941, LOS: 9 ADMISSION DATE:  06/19/2023, CHIEF COMPLAINT:  Respiratory Failure   History of Present Illness:   82 year old female with history of ILD and non-CF bronchiectasis presenting to the hospital with increased shortness of breath and severe hypoxemia.    Pertinent  Medical History   Patient's past medical history includes breast cancer status post bilateral mastectomy and chemoradiation. She also has a history of depression and anxiety  Interim History / Subjective:   06/25/23- Did develop increased shortness of breath yesterday she is getting worse and is at high risk of death currently on maximal settings on HFNC 95%/60L/min .  Reviewed imaging with family  06/26/23- patient is improved, s/p pRBC transfusion now more stable able to speak and have weaned O2 to 80%.  Provided plant based diet. Lengthy discussion with family.  Palliative care consultation ordered.  06/27/23- patient had first meal after week of being on 100%HFNC and non rebreather.  After interval improvement and nourishemnent had developed electrolyte distrubances consistent with re-feeding.  Pharmacy consultation and repletion in process. She is on 85%HFNC without non-rebreather.  She is conversive.  Family at bedside I met with them few times today. Palliative meeting in process I encouraged patient to continue to work towards improvement as she expressed wishes to hopefully go back to [place] to see remainder of her family once more. We will continue to support her maximally on medical management.  06/28/23- patient with no overnight events.  Has abnormal respiratory cultures with eneterobacter.  Reviewed with pharmacist and will utilize levoquin per sensitivity panel.  FiO2 at 76%/40. Met with family multiple times for update and medical plan.    Objective   Blood pressure (!) 138/58, pulse (!) 114, temperature 97.9 F (36.6 C), temperature source  Oral, resp. rate (!) 33, height 4\' 3"  (1.295 m), weight 44.2 kg, SpO2 (!) 88%.    FiO2 (%):  [80 %-85 %] 82 %   Intake/Output Summary (Last 24 hours) at 06/28/2023 1106 Last data filed at 06/28/2023 1000 Gross per 24 hour  Intake 1129.93 ml  Output 1325 ml  Net -195.07 ml   Filed Weights   06/26/23 0500 06/27/23 0500 06/28/23 0500  Weight: 46.7 kg 44.6 kg 44.2 kg    Examination: Physical Exam Constitutional:      General: She is not in acute distress.    Appearance: She is ill-appearing.  Cardiovascular:     Rate and Rhythm: Normal rate and regular rhythm.  Pulmonary:     Effort: Respiratory distress present.     Breath sounds: Rhonchi and rales present. No wheezing.  Neurological:     General: No focal deficit present.     Mental Status: She is alert. Mental status is at baseline.     Motor: Weakness present.      IMAGING       REVIEWED BY ME INDEPENDENTLY     Assessment & Plan:   #Acute on Chronic Hypoxic Respiratory Failure #ILD exacerbation Per imaging no biopsy done in the past Vs bronchiectasis exacerbation due to Enterobacter pneumonia         Infectious workup as below  -Respiratory viral panel- negative -serum fungitell- negative  -aspergillus - negative  -strep pneumoniae ur AG-negative  -Histoplasma Ur Ag -sputum resp cultures- abnormal -AFB sputum expectorated specimen -reviewed pertinent imaging with patient today - ESR, CRP trend  -please encourage patient to use incentive spirometer few times each  hour while hospitalized.   -MRSA screening Currently empirically on and zithromax, will change to cefepime and zithromax due to bronchiectasis  -currently on HFNC with weaning now on 80%/50 from 100%/60 yesterday -continue steroids , weaned to 40 daily but noted CRP has incremented and we have increased steroids back up and will continue to wean as able. Met with family multiple times today.   Type 2 NSTEMI  No Acute PE s/p CTPE , severe  elevation of BNP.   -S/pTTE with no RV failure/McCollum sign -active cardiac disease with demand ischemia  Severe microcytic anemia  -due to iron deficiency - definitely contributing to dyspnea and should be repleted -Feosol and vitamin C today  -s/p prbc transfusion   Moderate protein calorie malnutrition  - bitemporal wasting and peipheral muscle wasting -had RD nutritional evaluation  -s/p vegan nourishment -   Refeeding syndrome  - patient with severe electrolyte derrangement- repletion in process   Labs   CBC: Recent Labs  Lab 06/24/23 0445 06/25/23 0355 06/26/23 0555 06/27/23 0431 06/28/23 0605  WBC 16.0* 15.8* 15.0* 19.5* 18.1*  HGB 7.7* 8.0* 10.6* 10.7* 11.4*  HCT 23.9* 25.0* 33.4* 33.5* 36.4  MCV 75.4* 75.1* 76.8* 74.9* 75.8*  PLT 436* 464* 439* 458* 474*    Basic Metabolic Panel: Recent Labs  Lab 06/24/23 0445 06/25/23 0355 06/26/23 0555 06/27/23 0431 06/28/23 0605  NA 135 136 138 137 139  K 4.0 3.6 4.5 4.5 4.7  CL 97* 98 101 101 100  CO2 28 29 28 25 28   GLUCOSE 156* 116* 145* 120* 163*  BUN 21 22 19 19  24*  CREATININE 0.90 0.79 0.74 0.75 0.75  CALCIUM 8.5* 8.3* 8.5* 8.7* 8.3*  MG  --   --  2.4 2.3 2.6*  PHOS  --   --  2.3* 1.3* 3.7   GFR: Estimated Creatinine Clearance: 27.4 mL/min (by C-G formula based on SCr of 0.75 mg/dL). Recent Labs  Lab 06/25/23 0355 06/26/23 0555 06/27/23 0431 06/28/23 0605  WBC 15.8* 15.0* 19.5* 18.1*    Liver Function Tests: Recent Labs  Lab 06/23/23 0420 06/25/23 0355  AST 84* 32  ALT 144* 83*  ALKPHOS 135* 103  BILITOT 0.6 0.5  PROT 6.8 6.2*  ALBUMIN 2.6* 2.5*   No results for input(s): "LIPASE", "AMYLASE" in the last 168 hours. No results for input(s): "AMMONIA" in the last 168 hours.  ABG    Component Value Date/Time   PHART 7.3 (L) 06/20/2023 1829   PCO2ART 30 (L) 06/20/2023 1829   PO2ART 69 (L) 06/20/2023 1829   HCO3 27.8 06/21/2023 0546   ACIDBASEDEF 10.3 (H) 06/20/2023 1829   O2SAT  86.9 06/21/2023 0546     Coagulation Profile: No results for input(s): "INR", "PROTIME" in the last 168 hours.   Cardiac Enzymes: No results for input(s): "CKTOTAL", "CKMB", "CKMBINDEX", "TROPONINI" in the last 168 hours.  HbA1C: Hgb A1c MFr Bld  Date/Time Value Ref Range Status  02/18/2023 03:43 PM 6.1 (H) 4.8 - 5.6 % Final    Comment:             Prediabetes: 5.7 - 6.4          Diabetes: >6.4          Glycemic control for adults with diabetes: <7.0   08/27/2022 04:35 PM 6.6 (H) 4.8 - 5.6 % Final    Comment:             Prediabetes: 5.7 - 6.4  Diabetes: >6.4          Glycemic control for adults with diabetes: <7.0     CBG: Recent Labs  Lab 06/27/23 0813 06/27/23 1123 06/27/23 1640 06/27/23 2019 06/28/23 0735  GLUCAP 155* 204* 184* 171* 178*    Past Medical History:  She,  has a past medical history of Breast cancer (HCC), Cataract, Diabetes mellitus without complication (HCC), Hyperlipidemia, Hypertension, and Thyroid disease.   Surgical History:   Past Surgical History:  Procedure Laterality Date   BREAST SURGERY     EYE SURGERY       Social History:   reports that she has never smoked. She has never used smokeless tobacco. She reports that she does not drink alcohol and does not use drugs.   Family History:  Her family history includes Healthy in her brother, daughter, daughter, daughter, sister, and sister.   Allergies No Known Allergies   Home Medications  Prior to Admission medications   Medication Sig Start Date End Date Taking? Authorizing Provider  alendronate (FOSAMAX) 70 MG tablet Take 1 tablet (70 mg total) by mouth every 7 (seven) days. Take with a full glass of water on an empty stomach. 02/05/23  Yes Simmons-Robinson, Makiera, MD  atorvastatin (LIPITOR) 20 MG tablet Take 1 tablet (20 mg total) by mouth daily. 08/27/22  Yes Bacigalupo, Marzella Schlein, MD  budesonide (PULMICORT) 0.5 MG/2ML nebulizer solution Take 2 mLs (0.5 mg total) by  nebulization in the morning and at bedtime. 06/18/23 06/17/24 Yes Dgayli, Lianne Bushy, MD  Calcium Carbonate-Vitamin D 600-400 MG-UNIT tablet Take 1 tablet by mouth daily.    Yes [provider]  famotidine (PEPCID) 20 MG tablet Take 1 tablet (20 mg total) by mouth 2 (two) times daily. 01/01/23 01/01/24 Yes Bacigalupo, Marzella Schlein, MD  loratadine (CLARITIN) 10 MG tablet Take 1 tablet (10 mg total) by mouth daily. 04/08/23 04/07/24 Yes Dgayli, Lianne Bushy, MD  metFORMIN (GLUCOPHAGE) 500 MG tablet Take 1 tablet (500 mg total) by mouth 2 (two) times daily with a meal. 03/14/23  Yes Bacigalupo, Marzella Schlein, MD  omeprazole (PRILOSEC) 20 MG capsule Take 1 capsule (20 mg total) by mouth daily. 06/05/23  Yes Jacky Kindle, FNP  propranolol (INDERAL) 20 MG tablet Take 1 tablet (20 mg total) by mouth 2 (two) times daily. 04/22/23 10/19/23 Yes Bacigalupo, Marzella Schlein, MD  sertraline (ZOLOFT) 25 MG tablet Take 1 tablet (25 mg total) by mouth daily. 04/19/23  Yes Bacigalupo, Marzella Schlein, MD     Critical care provider statement:   Total critical care time: 71 minutes   Performed by: Karna Christmas MD   Critical care time was exclusive of separately billable procedures and treating other patients.   Critical care was necessary to treat or prevent imminent or life-threatening deterioration.   Critical care was time spent personally by me on the following activities: development of treatment plan with patient and/or surrogate as well as nursing, discussions with consultants, evaluation of patient's response to treatment, examination of patient, obtaining history from patient or surrogate, ordering and performing treatments and interventions, ordering and review of laboratory studies, ordering and review of radiographic studies, pulse oximetry and re-evaluation of patient's condition.    Vida Rigger, M.D.  Pulmonary & Critical Care Medicine

## 2023-06-28 NOTE — Progress Notes (Signed)
PHARMACY CONSULT NOTE - FOLLOW UP  Pharmacy Consult for Electrolyte Monitoring and Replacement   Recent Labs: Potassium (mmol/L)  Date Value  06/28/2023 4.7   Magnesium (mg/dL)  Date Value  29/52/8413 2.6 (H)   Calcium (mg/dL)  Date Value  24/40/1027 8.3 (L)   Albumin (g/dL)  Date Value  25/36/6440 2.5 (L)  02/18/2023 3.9   Phosphorus (mg/dL)  Date Value  34/74/2595 3.7   Sodium (mmol/L)  Date Value  06/28/2023 139  02/18/2023 129 (L)     Assessment: 82 y/o female with h/o breast cancer, depression, hypothyroid, HTN, HLD, GERD, ILD, IDA and pulmonary hypertension who is admitted with PNA, sepsis and MI.   Diuretics: furosemide 40 mg IV once daily  Goal of Therapy:  Electrolytes WNL  Plan:  ---no electrolyte replacement warranted for today ---recheck electrolytes in am  Lowella Bandy ,PharmD Clinical Pharmacist 06/28/2023 7:20 AM

## 2023-06-28 NOTE — Progress Notes (Signed)
PROGRESS NOTE    Erin Wagner  MVH:[id number] DOB: [birth year] DOA: 06/19/2023 PCP: Erasmo Downer, MD   Assessment & Plan:   Principal Problem:   Acute hypoxemic respiratory failure (HCC) Active Problems:   Septic shock (HCC)   Pulmonary hypertension (HCC)   Interstitial lung disease (HCC)   Essential hypertension   Hyponatremia   Hypothyroidism   Myocardial injury   History of breast cancer   Depression   Dyslipidemia   GERD without esophagitis   Uncontrolled type 2 diabetes mellitus with hyperglycemia, without long-term current use of insulin (HCC)   Constipation   Multifocal pneumonia   Iron deficiency anemia  Assessment and Plan: Sepsis: met criteria w/ tachypnea, leukocytosis & pneumonia. Continue on azithromycin, bronchodilators and encourage incentive spirometry & flutter valve. Pulmon following and recs apprec   Acute hypoxic respiratory failure: secondary to pneumonia. Continue on supplemental oxygen and wean as tolerated. Increased oxygen demand    Pneumonia: abxs changed to IV levaquin & continue on bronchodilators & encourage incentive spirometry & flutter valve. D/c cefepime & steroids. Pulmon following and recs apprec    Hyponatremia: resolved   Elevated troponin: likely secondary to demand ischemia in setting of sepsis    Hypothyroidism: continue on levothyroxine    DM2: well controlled, HbA1c 6.1. Holding home metformin. Continue on SSI w/ accuchecks    Transaminitis: likely secondary to sepsis. No acute findings on CT abd/pelvis. 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 Continue w/ supportive care. Pulmon fibrosis as per CT chest.    GERD: continue on PPI    HLD: not on a statin as per med rec    Depression: severity unknown. Holding home dose of sertraline as per pulmon recs      DVT prophylaxis:  \lovenox Code Status: DNR Family Communication: discussed pt's care w/ pt's fam at bedside and answered their questions  Disposition Plan: likely d/c back home   Level of care: Stepdown Status is: Inpatient Remains inpatient appropriate because: severity of illness    Consultants:  Pulmon   Procedures:   Antimicrobials: levaquin    Subjective: Pt appears uncomfortable  Objective: Vitals:   06/28/23 1000 06/28/23 1023 06/28/23 1029 06/28/23 1040  BP: (!) 138/58     Pulse: (!) 114     Resp: (!) 33     Temp:      TempSrc:      SpO2: (!) 89% (!) 84% (!) 85% (!) 88%  Weight:      Height:        Intake/Output Summary (Last 24 hours) at 06/28/2023 1150 Last data filed at 06/28/2023 1000 Gross per 24 hour  Intake 1129.93 ml  Output 1325 ml  Net -195.07 ml   Filed Weights   06/26/23 0500 06/27/23 0500 06/28/23 0500  Weight: 46.7 kg 44.6 kg 44.2 kg    Examination:  General exam: appears uncomfortable  Respiratory system: course breath sounds b/l  Cardiovascular system: S1/S2+ Gastrointestinal system: abd is soft, NT, ND &hypoactive bowel sounds  Central nervous system: lethargic Psychiatry: Judgement and insight appears at baseline.flat mood and affect    Data Reviewed: I have personally reviewed following labs and imaging studies  CBC: Recent Labs  Lab 06/24/23 0445 06/25/23 0355 06/26/23 0555 06/27/23 0431 06/28/23 0605  WBC 16.0* 15.8* 15.0* 19.5* 18.1*  HGB 7.7* 8.0* 10.6* 10.7* 11.4*  HCT 23.9* 25.0* 33.4* 33.5*  36.4  MCV 75.4* 75.1* 76.8* 74.9* 75.8*  PLT 436* 464* 439* 458* 474*   Basic Metabolic Panel: Recent Labs  Lab 06/24/23 0445 06/25/23 0355 06/26/23 0555 06/27/23 0431 06/28/23 0605  NA 135 136 138 137 139  K 4.0 3.6 4.5 4.5 4.7  CL 97* 98 101 101 100  CO2 28 29 28 25 28   GLUCOSE 156* 116* 145* 120* 163*  BUN 21 22 19 19  24*  CREATININE 0.90 0.79 0.74 0.75 0.75  CALCIUM 8.5* 8.3* 8.5* 8.7* 8.3*  MG  --   --  2.4 2.3 2.6*   PHOS  --   --  2.3* 1.3* 3.7   GFR: Estimated Creatinine Clearance: 27.4 mL/min (by C-G formula based on SCr of 0.75 mg/dL). Liver Function Tests: Recent Labs  Lab 06/23/23 0420 06/25/23 0355  AST 84* 32  ALT 144* 83*  ALKPHOS 135* 103  BILITOT 0.6 0.5  PROT 6.8 6.2*  ALBUMIN 2.6* 2.5*   No results for input(s): "LIPASE", "AMYLASE" in the last 168 hours. No results for input(s): "AMMONIA" in the last 168 hours. Coagulation Profile: No results for input(s): "INR", "PROTIME" in the last 168 hours.  Cardiac Enzymes: No results for input(s): "CKTOTAL", "CKMB", "CKMBINDEX", "TROPONINI" in the last 168 hours. BNP (last 3 results) No results for input(s): "PROBNP" in the last 8760 hours. HbA1C: No results for input(s): "HGBA1C" in the last 72 hours. CBG: Recent Labs  Lab 06/27/23 1123 06/27/23 1640 06/27/23 2019 06/28/23 0735 06/28/23 1132  GLUCAP 204* 184* 171* 178* 177*   Lipid Profile: No results for input(s): "CHOL", "HDL", "LDLCALC", "TRIG", "CHOLHDL", "LDLDIRECT" in the last 72 hours. Thyroid Function Tests: No results for input(s): "TSH", "T4TOTAL", "FREET4", "T3FREE", "THYROIDAB" in the last 72 hours. Anemia Panel: No results for input(s): "VITAMINB12", "FOLATE", "FERRITIN", "TIBC", "IRON", "RETICCTPCT" in the last 72 hours.  Sepsis Labs: No results for input(s): "PROCALCITON", "LATICACIDVEN" in the last 168 hours.   Recent Results (from the past 240 hours)  Blood Culture (routine x 2)     Status: None   Collection Time: 06/19/23  7:19 PM   Specimen: BLOOD  Result Value Ref Range Status   Specimen Description BLOOD BLOOD LEFT ARM  Final   Special Requests   Final    BOTTLES DRAWN AEROBIC AND ANAEROBIC Blood Culture adequate volume   Culture   Final    NO GROWTH 5 DAYS Performed at [facility], [address], [place], [place] [address]    Report Status 06/24/2023 FINAL  Final  Blood Culture (routine x 2)     Status: None   Collection Time:  06/19/23  7:19 PM   Specimen: BLOOD  Result Value Ref Range Status   Specimen Description BLOOD BLOOD RIGHT ARM  Final   Special Requests   Final    BOTTLES DRAWN AEROBIC AND ANAEROBIC Blood Culture adequate volume   Culture   Final    NO GROWTH 5 DAYS Performed at [facility], [address], [place], [place] [address]    Report Status 06/24/2023 FINAL  Final  Resp panel by RT-PCR (RSV, Flu A&B, Covid) Anterior Nasal Swab     Status: None   Collection Time: 06/19/23  8:03 PM   Specimen: Anterior Nasal Swab  Result Value Ref Range Status   SARS Coronavirus 2 by RT PCR NEGATIVE NEGATIVE Final    Comment: (NOTE) SARS-CoV-2 target nucleic acids are NOT DETECTED.  The SARS-CoV-2 RNA is generally detectable in upper respiratory specimens during  the acute phase of infection. The lowest concentration of SARS-CoV-2 viral copies this assay can detect is 138 copies/mL. A negative result does not preclude SARS-Cov-2 infection and should not be used as the sole basis for treatment or other patient management decisions. A negative result may occur with  improper specimen collection/handling, submission of specimen other than nasopharyngeal swab, presence of viral mutation(s) within the areas targeted by this assay, and inadequate number of viral copies(<138 copies/mL). A negative result must be combined with clinical observations, patient history, and epidemiological information. The expected result is Negative.  Fact Sheet for Patients:  BloggerCourse.com  Fact Sheet for Healthcare Providers:  SeriousBroker.it  This test is no t yet approved or cleared by the [place] FDA and  has been authorized for detection and/or diagnosis of SARS-CoV-2 by FDA under an Emergency Use Authorization (EUA). This EUA will remain  in effect (meaning this test can be used) for the duration of the COVID-19 declaration under Section 564(b)(1)  of the Act, 21 U.S.C.section 360bbb-3(b)(1), unless the authorization is terminated  or revoked sooner.       Influenza A by PCR NEGATIVE NEGATIVE Final   Influenza B by PCR NEGATIVE NEGATIVE Final    Comment: (NOTE) The Xpert Xpress SARS-CoV-2/FLU/RSV plus assay is intended as an aid in the diagnosis of influenza from Nasopharyngeal swab specimens and should not be used as a sole basis for treatment. Nasal washings and aspirates are unacceptable for Xpert Xpress SARS-CoV-2/FLU/RSV testing.  Fact Sheet for Patients: BloggerCourse.com  Fact Sheet for Healthcare Providers: SeriousBroker.it  This test is not yet approved or cleared by the [place] FDA and has been authorized for detection and/or diagnosis of SARS-CoV-2 by FDA under an Emergency Use Authorization (EUA). This EUA will remain in effect (meaning this test can be used) for the duration of the COVID-19 declaration under Section 564(b)(1) of the Act, 21 U.S.C. section 360bbb-3(b)(1), unless the authorization is terminated or revoked.     Resp Syncytial Virus by PCR NEGATIVE NEGATIVE Final    Comment: (NOTE) Fact Sheet for Patients: BloggerCourse.com  Fact Sheet for Healthcare Providers: SeriousBroker.it  This test is not yet approved or cleared by the [place] FDA and has been authorized for detection and/or diagnosis of SARS-CoV-2 by FDA under an Emergency Use Authorization (EUA). This EUA will remain in effect (meaning this test can be used) for the duration of the COVID-19 declaration under Section 564(b)(1) of the Act, 21 U.S.C. section 360bbb-3(b)(1), unless the authorization is terminated or revoked.  Performed at [facility], [address] [place], [place] [address]   Respiratory (~20 pathogens) panel by PCR     Status: None   Collection Time: 06/20/23  9:28 AM   Specimen:  Nasopharyngeal Swab; Respiratory  Result Value Ref Range Status   Adenovirus NOT DETECTED NOT DETECTED Final   Coronavirus 229E NOT DETECTED NOT DETECTED Final    Comment: (NOTE) The Coronavirus on the Respiratory Panel, DOES NOT test for the novel  Coronavirus (2019 nCoV)    Coronavirus HKU1 NOT DETECTED NOT DETECTED Final   Coronavirus NL63 NOT DETECTED NOT DETECTED Final   Coronavirus OC43 NOT DETECTED NOT DETECTED Final   Metapneumovirus NOT DETECTED NOT DETECTED Final   Rhinovirus / Enterovirus NOT DETECTED NOT DETECTED Final   Influenza A NOT DETECTED NOT DETECTED Final   Influenza B NOT DETECTED NOT DETECTED Final   Parainfluenza Virus 1 NOT DETECTED NOT DETECTED Final   Parainfluenza Virus 2 NOT DETECTED NOT  DETECTED Final   Parainfluenza Virus 3 NOT DETECTED NOT DETECTED Final   Parainfluenza Virus 4 NOT DETECTED NOT DETECTED Final   Respiratory Syncytial Virus NOT DETECTED NOT DETECTED Final   Bordetella pertussis NOT DETECTED NOT DETECTED Final   Bordetella Parapertussis NOT DETECTED NOT DETECTED Final   Chlamydophila pneumoniae NOT DETECTED NOT DETECTED Final   Mycoplasma pneumoniae NOT DETECTED NOT DETECTED Final    Comment: Performed at [facility], [address], [place], [place] [address]  MRSA Next Gen by PCR, Nasal     Status: None   Collection Time: 06/20/23  9:28 AM   Specimen: Nasopharyngeal Swab; Nasal Swab  Result Value Ref Range Status   MRSA by PCR Next Gen NOT DETECTED NOT DETECTED Final    Comment: (NOTE) The GeneXpert MRSA Assay (FDA approved for NASAL specimens only), is one component of a comprehensive MRSA colonization surveillance program. It is not intended to diagnose MRSA infection nor to guide or monitor treatment for MRSA infections. Test performance is not FDA approved in patients less than 47 years old. Performed at [facility], [address] [place], [place] [address]   Aspergillus Ag, BAL/Serum     Status: None    Collection Time: 06/23/23 11:37 AM   Specimen: Vein  Result Value Ref Range Status   Aspergillus Ag, BAL/Serum 0.04 0.00 - 0.49 Index Final    Comment: (NOTE) Performed At: [facility] [address] [place], [place] [id number] Jolene Schimke MD GY:[id number]   Acid Fast Smear (AFB)     Status: None   Collection Time: 06/25/23  9:44 AM   Specimen: Sputum  Result Value Ref Range Status   AFB Specimen Processing Concentration  Final   Acid Fast Smear Negative  Final    Comment: (NOTE) Performed At: [facility] [address] [place], [place] [id number] Jolene Schimke MD EX:[id number]    Source (AFB) EXPECTORATED SPUTUM  Final    Comment: Performed at [facility], [address] [place], [place] [address]  Expectorated Sputum Assessment w Gram Stain, Rflx to Resp Cult     Status: None   Collection Time: 06/25/23  9:44 AM   Specimen: SPU  Result Value Ref Range Status   Specimen Description SPUTUM  Final   Special Requests NONE  Final   Sputum evaluation   Final    THIS SPECIMEN IS ACCEPTABLE FOR SPUTUM CULTURE Performed at [facility], [address], [place], [place] [address]    Report Status 06/25/2023 FINAL  Final  Culture, Respiratory w Gram Stain     Status: None (Preliminary result)   Collection Time: 06/25/23  9:44 AM   Specimen: SPU  Result Value Ref Range Status   Specimen Description   Final    SPUTUM Performed at [facility], [address], [place], [place] [address]    Special Requests   Final    NONE Performed at [facility], [address], [place], [place] [address]    Gram Stain   Final    NO WBC SEEN RARE GRAM POSITIVE COCCI IN PAIRS Performed at [facility], [address], [place], [place] [address]    Culture FEW ENTEROBACTER CLOACAE  Final   Report Status PENDING  Incomplete   Organism ID, Bacteria ENTEROBACTER CLOACAE  Final      Susceptibility   Enterobacter cloacae - MIC*     CEFEPIME <=0.12 SENSITIVE Sensitive     CEFTAZIDIME <=1 SENSITIVE  Sensitive     CIPROFLOXACIN <=0.25 SENSITIVE Sensitive     GENTAMICIN <=1 SENSITIVE Sensitive     IMIPENEM <=0.25 SENSITIVE Sensitive     TRIMETH/SULFA <=20 SENSITIVE Sensitive     PIP/TAZO <=4 SENSITIVE Sensitive ug/mL    * FEW ENTEROBACTER CLOACAE         Radiology Studies: DG Chest Port 1 View Result Date: 06/26/2023 CLINICAL DATA:  Follow-up edema EXAM: PORTABLE CHEST 1 VIEW COMPARISON:  06/23/2023, CT 06/24/2023, 06/19/2023, 08/27/2022 FINDINGS: Stable cardiomediastinal silhouette. Underlying chronic interstitial lung disease. Overall no significant interval change in appearance of the chest since 06/23/2023 with widespread mild diffuse interstitial and ground-glass opacity. Aortic atherosclerosis. No pleural effusion or pneumothorax. IMPRESSION: No significant interval change in appearance of the chest since 06/23/2023. Underlying chronic interstitial lung disease and fibrosis with suspected superimposed mild diffuse interstitial and ground-glass opacity. Cardiomegaly. Electronically Signed   By: Jasmine Pang M.D.   On: 06/26/2023 19:30        Scheduled Meds:  ascorbic acid  500 mg Oral BID   bisacodyl  5 mg Oral Once   Chlorhexidine Gluconate Cloth  6 each Topical Daily   dexamethasone (DECADRON) injection  8 mg Intravenous Q12H   docusate sodium  200 mg Oral BID   enoxaparin (LOVENOX) injection  30 mg Subcutaneous Q24H   feeding supplement (KATE FARMS STANDARD 1.4)  325 mL Oral BID BM   ferrous sulfate  325 mg Oral BID WC   furosemide  40 mg Intravenous Daily   guaiFENesin  600 mg Oral BID   insulin aspart  0-15 Units Subcutaneous TID AC & HS   ipratropium-albuterol  3 mL Nebulization QID   levothyroxine  25 mcg Oral Q0600   pantoprazole  40 mg Oral Daily   Continuous Infusions:  levofloxacin (LEVAQUIN) IV 750 mg (06/28/23 1146)     LOS: 9 days      Charise Killian, MD [facility] Pager [phone]  If 7PM-7AM, please contact night-coverage [email or web address] 06/28/2023, 11:50 AM

## 2023-06-28 NOTE — Progress Notes (Signed)
Daily Progress Note   Patient Name: Erin Wagner       Date: 06/28/2023 DOB: 03-17-1941  Age: 82 y.o. MRN#: [id number] Attending Physician: Charise Killian, MD Primary Care Physician: Erasmo Downer, MD Admit Date: 06/19/2023  Reason for Consultation/Follow-up: Establishing goals of care  Subjective: Notes and labs reviewed.  Into see patient.  She is currently sitting in bed with eyes closed and high flow cannula in place.  Her son is present at bedside.  He states she was awake this morning and discussing being tired.  He states she has slept most of the day with extremely poor p.o. intake.  He discusses updates from pulmonology.  Discussed that PMT will follow-up Sunday on my return to service..  Length of Stay: 9  Current Medications: Scheduled Meds:   ascorbic acid  500 mg Oral BID   bisacodyl  5 mg Oral Once   Chlorhexidine Gluconate Cloth  6 each Topical Daily   dexamethasone (DECADRON) injection  8 mg Intravenous Q12H   docusate sodium  200 mg Oral BID   enoxaparin (LOVENOX) injection  30 mg Subcutaneous Q24H   feeding supplement (KATE FARMS STANDARD 1.4)  325 mL Oral BID BM   ferrous sulfate  325 mg Oral BID WC   furosemide  40 mg Intravenous Daily   guaiFENesin  600 mg Oral BID   insulin aspart  0-15 Units Subcutaneous TID AC & HS   ipratropium-albuterol  3 mL Nebulization QID   levothyroxine  25 mcg Oral Q0600   pantoprazole  40 mg Oral Daily    Continuous Infusions:  levofloxacin (LEVAQUIN) IV Stopped (06/28/23 1316)    PRN Meds: acetaminophen **OR** acetaminophen, bisacodyl, magnesium hydroxide, [DISCONTINUED] ondansetron **OR** ondansetron (ZOFRAN) IV, mouth rinse, sodium chloride, traZODone  Physical Exam Constitutional:      Comments: Eyes  closed  Pulmonary:     Comments: On high flow cannula            Vital Signs: BP 132/77 (BP Location: Left Arm)   Pulse (!) 115   Temp 97.7 F (36.5 C) (Oral)   Resp (!) 28   Ht 4\' 3"  (1.295 m)   Wt 44.2 kg   SpO2 93%   BMI 26.34 kg/m  SpO2: SpO2: 93 % O2 Device: O2 Device: Heated High Flow Nasal  Cannula O2 Flow Rate: O2 Flow Rate (L/min): 40 L/min  Intake/output summary:  Intake/Output Summary (Last 24 hours) at 06/28/2023 1646 Last data filed at 06/28/2023 1600 Gross per 24 hour  Intake 542.66 ml  Output 1270 ml  Net -727.34 ml   LBM: Last BM Date : 06/27/23 Baseline Weight: Weight: 46 kg Most recent weight: Weight: 44.2 kg    Patient Active Problem List   Diagnosis Date Noted   Pulmonary hypertension (HCC) 06/24/2023   Iron deficiency anemia 06/24/2023   Septic shock (HCC) 06/23/2023   Myocardial injury 06/23/2023   Uncontrolled type 2 diabetes mellitus with hyperglycemia, without long-term current use of insulin (HCC) 06/23/2023   Constipation 06/23/2023   Multifocal pneumonia 06/23/2023   Hyponatremia 06/20/2023   Essential hypertension 06/19/2023   Dyslipidemia 06/19/2023   GERD without esophagitis 06/19/2023   Interstitial lung disease (HCC) 06/19/2023   Acute hypoxemic respiratory failure (HCC) 06/19/2023   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   Urge incontinence 04/23/2023   ILD (interstitial lung disease) (HCC) 01/15/2023   Gait difficulty 01/15/2023   Chronic cough 12/06/2022   Bibasilar crackles 08/27/2022   Localized 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 fracture 02/22/2022   Hypothyroidism 05/13/2019   Essential tremor 05/13/2019   Allergic conjunctivitis of both eyes 05/13/2019   Lymphedema 12/13/2017   History of cataract 09/09/2017   Depression 09/09/2017   History of breast cancer 06/27/2017   Diabetes mellitus (HCC) 06/27/2017   Hyperlipidemia associated with type 2 diabetes mellitus (HCC) 06/27/2017   GERD  (gastroesophageal reflux disease) 06/27/2017   Hypertension associated with diabetes (HCC) 06/27/2017    Palliative Care Assessment & Plan    Recommendations/Plan: Continue current care.  Time for outcomes. PMT will follow-up Sunday on my return to service.  Code Status:    Code Status Orders  (From admission, onward)           Start     Ordered   06/23/23 1818  Do not attempt resuscitation (DNR)- Limited -Do Not Intubate (DNI)  (Code Status)  Continuous       Question Answer Comment  If pulseless and not breathing No CPR or chest compressions.   In Pre-Arrest Conditions (Patient Is Breathing and Has A Pulse) Do not intubate. Provide all appropriate non-invasive medical interventions. Avoid ICU transfer unless indicated or required.   Consent: Discussion documented in EHR or advanced directives reviewed      12 /15/24 1817           Code Status History     Date Active Date Inactive Code Status Order ID Comments User Context   06/20/2023 0521 06/23/2023 1816 Do not attempt resuscitation (DNR) PRE-ARREST INTERVENTIONS DESIRED [id number]  Hannah Beat, MD ED   06/19/2023 2135 06/20/2023 0521 Full Code [id number]  Mansy, Vernetta Honey, MD ED     Thank you for allowing the Palliative Medicine Team to assist in the care of this patient.     Morton Stall, NP  Please contact Palliative Medicine Team phone at [phone] for questions and concerns.

## 2023-06-28 NOTE — Plan of Care (Signed)
  Problem: Fluid Volume: Goal: Hemodynamic stability will improve Outcome: Progressing   Problem: Clinical Measurements: Goal: Signs and symptoms of infection will decrease Outcome: Progressing   Problem: Respiratory: Goal: Ability to maintain adequate ventilation will improve Outcome: Progressing   Problem: Education: Goal: Ability to describe self-care measures that may prevent or decrease complications (Diabetes Survival Skills Education) will improve Outcome: Progressing   Problem: Fluid Volume: Goal: Ability to maintain a balanced intake and output will improve Outcome: Progressing

## 2023-06-29 DIAGNOSIS — J9601 Acute respiratory failure with hypoxia: Secondary | ICD-10-CM | POA: Diagnosis not present

## 2023-06-29 DIAGNOSIS — A419 Sepsis, unspecified organism: Secondary | ICD-10-CM | POA: Diagnosis not present

## 2023-06-29 DIAGNOSIS — J849 Interstitial pulmonary disease, unspecified: Secondary | ICD-10-CM | POA: Diagnosis not present

## 2023-06-29 LAB — CULTURE, RESPIRATORY W GRAM STAIN: Gram Stain: NONE SEEN

## 2023-06-29 LAB — CBC WITH DIFFERENTIAL/PLATELET
Abs Immature Granulocytes: 0.14 10*3/uL — ABNORMAL HIGH (ref 0.00–0.07)
Basophils Absolute: 0 10*3/uL (ref 0.0–0.1)
Basophils Relative: 0 %
Eosinophils Absolute: 0 10*3/uL (ref 0.0–0.5)
Eosinophils Relative: 0 %
HCT: 37.1 % (ref 36.0–46.0)
Hemoglobin: 11.6 g/dL — ABNORMAL LOW (ref 12.0–15.0)
Immature Granulocytes: 1 %
Lymphocytes Relative: 2 %
Lymphs Abs: 0.4 10*3/uL — ABNORMAL LOW (ref 0.7–4.0)
MCH: 23.8 pg — ABNORMAL LOW (ref 26.0–34.0)
MCHC: 31.3 g/dL (ref 30.0–36.0)
MCV: 76.2 fL — ABNORMAL LOW (ref 80.0–100.0)
Monocytes Absolute: 0.5 10*3/uL (ref 0.1–1.0)
Monocytes Relative: 2 %
Neutro Abs: 17.9 10*3/uL — ABNORMAL HIGH (ref 1.7–7.7)
Neutrophils Relative %: 95 %
Platelets: 458 10*3/uL — ABNORMAL HIGH (ref 150–400)
RBC: 4.87 MIL/uL (ref 3.87–5.11)
RDW: 16.2 % — ABNORMAL HIGH (ref 11.5–15.5)
WBC: 18.9 10*3/uL — ABNORMAL HIGH (ref 4.0–10.5)
nRBC: 0 % (ref 0.0–0.2)

## 2023-06-29 LAB — COMPREHENSIVE METABOLIC PANEL
ALT: 33 U/L (ref 0–44)
AST: 24 U/L (ref 15–41)
Albumin: 2.6 g/dL — ABNORMAL LOW (ref 3.5–5.0)
Alkaline Phosphatase: 93 U/L (ref 38–126)
Anion gap: 11 (ref 5–15)
BUN: 31 mg/dL — ABNORMAL HIGH (ref 8–23)
CO2: 28 mmol/L (ref 22–32)
Calcium: 8.6 mg/dL — ABNORMAL LOW (ref 8.9–10.3)
Chloride: 101 mmol/L (ref 98–111)
Creatinine, Ser: 0.83 mg/dL (ref 0.44–1.00)
GFR, Estimated: >60 mL/min (ref >60)
Glucose, Bld: 160 mg/dL — ABNORMAL HIGH (ref 70–99)
Potassium: 4.3 mmol/L (ref 3.5–5.1)
Sodium: 140 mmol/L (ref 135–145)
Total Bilirubin: 0.6 mg/dL (ref <1.2)
Total Protein: 6.9 g/dL (ref 6.5–8.1)

## 2023-06-29 LAB — GLUCOSE, CAPILLARY
Glucose-Capillary: 158 mg/dL — ABNORMAL HIGH (ref 70–99)
Glucose-Capillary: 157 mg/dL — ABNORMAL HIGH (ref 70–99)
Glucose-Capillary: 173 mg/dL — ABNORMAL HIGH (ref 70–99)
Glucose-Capillary: 162 mg/dL — ABNORMAL HIGH (ref 70–99)

## 2023-06-29 LAB — PHOSPHORUS: Phosphorus: 2.7 mg/dL (ref 2.5–4.6)

## 2023-06-29 LAB — MAGNESIUM: Magnesium: 2.8 mg/dL — ABNORMAL HIGH (ref 1.7–2.4)

## 2023-06-29 LAB — C-REACTIVE PROTEIN: CRP: 14.5 mg/dL — ABNORMAL HIGH (ref <1.0)

## 2023-06-29 MED ORDER — MORPHINE SULFATE (PF) 2 MG/ML IV SOLN
1.0000 mg | Freq: Once | INTRAVENOUS | Status: AC
  Administered 2023-06-29: 1 mg via INTRAVENOUS
  Filled 2023-06-29: qty 1

## 2023-06-29 MED ORDER — IPRATROPIUM-ALBUTEROL 0.5-2.5 (3) MG/3ML IN SOLN
3.0000 mL | Freq: Two times a day (BID) | RESPIRATORY_TRACT | Status: DC
  Administered 2023-06-30 – 2023-07-01 (×3): 3 mL via RESPIRATORY_TRACT
  Filled 2023-06-29 (×3): qty 3

## 2023-06-29 MED ORDER — MORPHINE SULFATE (PF) 2 MG/ML IV SOLN
2.0000 mg | Freq: Once | INTRAVENOUS | Status: AC
  Administered 2023-06-29: 2 mg via INTRAVENOUS
  Filled 2023-06-29: qty 1

## 2023-06-29 NOTE — Progress Notes (Signed)
NAME:  Erin Wagner, MRN:  [id number], DOB:  Aug 25, 1940, LOS: 10 ADMISSION DATE:  06/19/2023, CHIEF COMPLAINT:  Respiratory Failure   History of Present Illness:   82 year old female with history of ILD and non-CF bronchiectasis presenting to the hospital with increased shortness of breath and severe hypoxemia.    Pertinent  Medical History   Patient's past medical history includes breast cancer status post bilateral mastectomy and chemoradiation. She also has a history of depression and anxiety  Interim History / Subjective:   06/25/23- Did develop increased shortness of breath yesterday she is getting worse and is at high risk of death currently on maximal settings on HFNC 95%/60L/min .  Reviewed imaging with family  06/26/23- patient is improved, s/p pRBC transfusion now more stable able to speak and have weaned O2 to 80%.  Provided plant based diet. Lengthy discussion with family.  Palliative care consultation ordered.  06/27/23- patient had first meal after week of being on 100%HFNC and non rebreather.  After interval improvement and nourishemnent had developed electrolyte distrubances consistent with re-feeding.  Pharmacy consultation and repletion in process. She is on 85%HFNC without non-rebreather.  She is conversive.  Family at bedside I met with them few times today. Palliative meeting in process I encouraged patient to continue to work towards improvement as she expressed wishes to hopefully go back to [place] to see remainder of her family once more. We will continue to support her maximally on medical management.  06/28/23- patient with no overnight events.  Has abnormal respiratory cultures with eneterobacter.  Reviewed with pharmacist and will utilize levoquin per sensitivity panel.  FiO2 at 76%/40. Met with family multiple times for update and medical plan.  06/29/23- patient remains hypoxemic on HFNC, she was able to drink some fluids today.  She is not as tachypneic  or tachycardic post metoprolol and low dose clonidine patch initiated yesterday.  Met with family today multiple times. CRP treding down, remains on levofloxacin and decadron BID  Objective   Blood pressure (!) 102/55, pulse (!) 109, temperature 97.9 F (36.6 C), temperature source Axillary, resp. rate (!) 26, height 4\' 3"  (1.295 m), weight 42.8 kg, SpO2 95%.    FiO2 (%):  [72 %-85 %] 85 %   Intake/Output Summary (Last 24 hours) at 06/29/2023 1127 Last data filed at 06/29/2023 0800 Gross per 24 hour  Intake 275.03 ml  Output 1050 ml  Net -774.97 ml   Filed Weights   06/27/23 0500 06/28/23 0500 06/29/23 0500  Weight: 44.6 kg 44.2 kg 42.8 kg    Examination: Physical Exam Constitutional:      General: She is not in acute distress.    Appearance: She is ill-appearing.  Cardiovascular:     Rate and Rhythm: Normal rate and regular rhythm.  Pulmonary:     Effort: Respiratory distress present.     Breath sounds: Rhonchi and rales present. No wheezing.  Neurological:     General: No focal deficit present.     Mental Status: She is alert. Mental status is at baseline.     Motor: Weakness present.      IMAGING       REVIEWED BY ME INDEPENDENTLY     Assessment & Plan:   #Acute on Chronic Hypoxic Respiratory Failure #ILD exacerbation Per imaging no biopsy done in the past Vs bronchiectasis exacerbation due to Enterobacter pneumonia         Infectious workup as below  -Respiratory viral panel- negative -  serum fungitell- negative  -aspergillus - negative  -strep pneumoniae ur AG-negative  -Histoplasma Ur Ag -sputum resp cultures- abnormal -AFB sputum expectorated specimen -reviewed pertinent imaging with patient today - ESR, CRP trend  -please encourage patient to use incentive spirometer few times each hour while hospitalized.   -MRSA screening Currently empirically on and zithromax, will change to cefepime and zithromax due to bronchiectasis  -currently on HFNC  with weaning now on 80%/50 from 100%/60 yesterday -continue steroids , weaned to 40 daily but noted CRP has incremented and we have increased steroids back up and will continue to wean as able. Met with family multiple times today.   Type 2 NSTEMI  No Acute PE s/p CTPE , severe elevation of BNP.   -S/pTTE with no RV failure/McCollum sign -active cardiac disease with demand ischemia  Severe microcytic anemia  -due to iron deficiency - definitely contributing to dyspnea and should be repleted -Feosol and vitamin C today  -s/p prbc transfusion   Moderate protein calorie malnutrition  - bitemporal wasting and peipheral muscle wasting -had RD nutritional evaluation  -s/p vegan nourishment -   Refeeding syndrome  - patient with severe electrolyte derrangement- repletion in process   Labs   CBC: Recent Labs  Lab 06/25/23 0355 06/26/23 0555 06/27/23 0431 06/28/23 0605 06/29/23 0342  WBC 15.8* 15.0* 19.5* 18.1* 18.9*  NEUTROABS  --   --   --   --  17.9*  HGB 8.0* 10.6* 10.7* 11.4* 11.6*  HCT 25.0* 33.4* 33.5* 36.4 37.1  MCV 75.1* 76.8* 74.9* 75.8* 76.2*  PLT 464* 439* 458* 474* 458*    Basic Metabolic Panel: Recent Labs  Lab 06/25/23 0355 06/26/23 0555 06/27/23 0431 06/28/23 0605 06/29/23 0342  NA 136 138 137 139 140  K 3.6 4.5 4.5 4.7 4.3  CL 98 101 101 100 101  CO2 29 28 25 28 28   GLUCOSE 116* 145* 120* 163* 160*  BUN 22 19 19  24* 31*  CREATININE 0.79 0.74 0.75 0.75 0.83  CALCIUM 8.3* 8.5* 8.7* 8.3* 8.6*  MG  --  2.4 2.3 2.6* 2.8*  PHOS  --  2.3* 1.3* 3.7 2.7   GFR: Estimated Creatinine Clearance: 25.9 mL/min (by C-G formula based on SCr of 0.83 mg/dL). Recent Labs  Lab 06/26/23 0555 06/27/23 0431 06/28/23 0605 06/29/23 0342  WBC 15.0* 19.5* 18.1* 18.9*    Liver Function Tests: Recent Labs  Lab 06/23/23 0420 06/25/23 0355 06/29/23 0342  AST 84* 32 24  ALT 144* 83* 33  ALKPHOS 135* 103 93  BILITOT 0.6 0.5 0.6  PROT 6.8 6.2* 6.9  ALBUMIN 2.6*  2.5* 2.6*   No results for input(s): "LIPASE", "AMYLASE" in the last 168 hours. No results for input(s): "AMMONIA" in the last 168 hours.  ABG    Component Value Date/Time   PHART 7.3 (L) 06/20/2023 1829   PCO2ART 30 (L) 06/20/2023 1829   PO2ART 69 (L) 06/20/2023 1829   HCO3 27.8 06/21/2023 0546   ACIDBASEDEF 10.3 (H) 06/20/2023 1829   O2SAT 86.9 06/21/2023 0546     Coagulation Profile: No results for input(s): "INR", "PROTIME" in the last 168 hours.   Cardiac Enzymes: No results for input(s): "CKTOTAL", "CKMB", "CKMBINDEX", "TROPONINI" in the last 168 hours.  HbA1C: Hgb A1c MFr Bld  Date/Time Value Ref Range Status  02/18/2023 03:43 PM 6.1 (H) 4.8 - 5.6 % Final    Comment:             Prediabetes: 5.7 - 6.4  Diabetes: >6.4          Glycemic control for adults with diabetes: <7.0   08/27/2022 04:35 PM 6.6 (H) 4.8 - 5.6 % Final    Comment:             Prediabetes: 5.7 - 6.4          Diabetes: >6.4          Glycemic control for adults with diabetes: <7.0     CBG: Recent Labs  Lab 06/28/23 0735 06/28/23 1132 06/28/23 1637 06/28/23 2113 06/29/23 0757  GLUCAP 178* 177* 194* 164* 158*    Past Medical History:  She,  has a past medical history of Breast cancer (HCC), Cataract, Diabetes mellitus without complication (HCC), Hyperlipidemia, Hypertension, and Thyroid disease.   Surgical History:   Past Surgical History:  Procedure Laterality Date   BREAST SURGERY     EYE SURGERY       Social History:   reports that she has never smoked. She has never used smokeless tobacco. She reports that she does not drink alcohol and does not use drugs.   Family History:  Her family history includes Healthy in her brother, daughter, daughter, daughter, sister, and sister.   Allergies No Known Allergies   Home Medications  Prior to Admission medications   Medication Sig Start Date End Date Taking? Authorizing Provider  alendronate (FOSAMAX) 70 MG tablet Take 1  tablet (70 mg total) by mouth every 7 (seven) days. Take with a full glass of water on an empty stomach. 02/05/23  Yes Simmons-Robinson, Makiera, MD  atorvastatin (LIPITOR) 20 MG tablet Take 1 tablet (20 mg total) by mouth daily. 08/27/22  Yes Bacigalupo, Marzella Schlein, MD  budesonide (PULMICORT) 0.5 MG/2ML nebulizer solution Take 2 mLs (0.5 mg total) by nebulization in the morning and at bedtime. 06/18/23 06/17/24 Yes Dgayli, Lianne Bushy, MD  Calcium Carbonate-Vitamin D 600-400 MG-UNIT tablet Take 1 tablet by mouth daily.    Yes [provider]  famotidine (PEPCID) 20 MG tablet Take 1 tablet (20 mg total) by mouth 2 (two) times daily. 01/01/23 01/01/24 Yes Bacigalupo, Marzella Schlein, MD  loratadine (CLARITIN) 10 MG tablet Take 1 tablet (10 mg total) by mouth daily. 04/08/23 04/07/24 Yes Dgayli, Lianne Bushy, MD  metFORMIN (GLUCOPHAGE) 500 MG tablet Take 1 tablet (500 mg total) by mouth 2 (two) times daily with a meal. 03/14/23  Yes Bacigalupo, Marzella Schlein, MD  omeprazole (PRILOSEC) 20 MG capsule Take 1 capsule (20 mg total) by mouth daily. 06/05/23  Yes Jacky Kindle, FNP  propranolol (INDERAL) 20 MG tablet Take 1 tablet (20 mg total) by mouth 2 (two) times daily. 04/22/23 10/19/23 Yes Bacigalupo, Marzella Schlein, MD  sertraline (ZOLOFT) 25 MG tablet Take 1 tablet (25 mg total) by mouth daily. 04/19/23  Yes Bacigalupo, Marzella Schlein, MD     Critical care provider statement:   Total critical care time: 71 minutes   Performed by: Karna Christmas MD   Critical care time was exclusive of separately billable procedures and treating other patients.   Critical care was necessary to treat or prevent imminent or life-threatening deterioration.   Critical care was time spent personally by me on the following activities: development of treatment plan with patient and/or surrogate as well as nursing, discussions with consultants, evaluation of patient's response to treatment, examination of patient, obtaining history from patient or surrogate,  ordering and performing treatments and interventions, ordering and review of laboratory studies, ordering and review of radiographic studies, pulse oximetry and  re-evaluation of patient's condition.    Vida Rigger, M.D.  Pulmonary & Critical Care Medicine

## 2023-06-29 NOTE — Plan of Care (Signed)
  Problem: Fluid Volume: Goal: Hemodynamic stability will improve Outcome: Progressing   Problem: Clinical Measurements: Goal: Diagnostic test results will improve Outcome: Not Progressing Goal: Signs and symptoms of infection will decrease Outcome: Not Progressing   Problem: Respiratory: Goal: Ability to maintain adequate ventilation will improve Outcome: Not Progressing   Problem: Coping: Goal: Ability to adjust to condition or change in health will improve Outcome: Not Progressing   Problem: Fluid Volume: Goal: Ability to maintain a balanced intake and output will improve Outcome: Not Progressing   Problem: Skin Integrity: Goal: Risk for impaired skin integrity will decrease Outcome: Not Progressing

## 2023-06-29 NOTE — Plan of Care (Signed)
  Problem: Fluid Volume: Goal: Hemodynamic stability will improve Outcome: Progressing   Problem: Clinical Measurements: Goal: Signs and symptoms of infection will decrease Outcome: Progressing   Problem: Coping: Goal: Ability to adjust to condition or change in health will improve Outcome: Progressing

## 2023-06-29 NOTE — Progress Notes (Signed)
PHARMACY CONSULT NOTE  Pharmacy Consult for Electrolyte Monitoring and Replacement   Recent Labs: Potassium (mmol/L)  Date Value  06/29/2023 4.3   Magnesium (mg/dL)  Date Value  16/04/9603 2.8 (H)   Calcium (mg/dL)  Date Value  54/03/8118 8.6 (L)   Albumin (g/dL)  Date Value  14/78/2956 2.6 (L)  02/18/2023 3.9   Phosphorus (mg/dL)  Date Value  21/30/8657 2.7   Sodium (mmol/L)  Date Value  06/29/2023 140  02/18/2023 129 (L)   Assessment: 82 y/o female with h/o breast cancer, depression, hypothyroid, HTN, HLD, GERD, ILD, IDA and pulmonary hypertension who is admitted with PNA, sepsis and MI.   Diuretics: IV Lasix 40 mg daily  Goal of Therapy:  Electrolytes WNL  Plan:  --Electrolytes are stable and not requiring replacement --Will discontinue electrolyte consult at this time. Pharmacy will continue to monitor peripherally  Tressie Ellis 06/29/2023 7:23 AM

## 2023-06-29 NOTE — Progress Notes (Signed)
PROGRESS NOTE    Erin Wagner  MWN:[id number] DOB: 04-May-1941 DOA: 06/19/2023 PCP: Erasmo Downer, MD   Assessment & Plan:   Principal Problem:   Acute hypoxemic respiratory failure (HCC) Active Problems:   Septic shock (HCC)   Pulmonary hypertension (HCC)   Interstitial lung disease (HCC)   Essential hypertension   Hyponatremia   Hypothyroidism   Myocardial injury   History of breast cancer   Depression   Dyslipidemia   GERD without esophagitis   Uncontrolled type 2 diabetes mellitus with hyperglycemia, without long-term current use of insulin (HCC)   Constipation   Multifocal pneumonia   Iron deficiency anemia  Assessment and Plan: Sepsis: met criteria w/ tachypnea, leukocytosis & pneumonia. Continue on levaquin, bronchodilators. Pulmon following and recs apprec   Acute hypoxic respiratory failure: secondary to pneumonia. Continue on supplemental oxygen and wean as tolerated    Pneumonia: continue on IV levaquin, bronchodilators. D/c cefepime & steroids. Pulmon following and recs apprecs    Hyponatremia: resolved   Elevated troponin: likely secondary to demand ischemia in setting of sepsis    Hypothyroidism: continue on levothyroxine    DM2: well controlled, HbA1c 6.1. Holding home metformin. Continue on SSI w/ accuchecks    Transaminitis: likely secondary to sepsis. No acute findings on CT abd/pelvis. 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 Continue w/ supportive care. Pulmon fibrosis as per CT chest.    GERD: continue on PPI    HLD: not on a statin as per med rec    Depression: severity unknown. Holding home dose of sertraline as per pulmon recs      DVT prophylaxis: \lovenox Code Status: DNR Family Communication: discussed pt's care w/ pt's fam at bedside and answered their questions  Disposition Plan: likely  d/c back home   Level of care: Stepdown Status is: Inpatient Remains inpatient appropriate because: severity of illness    Consultants:  Pulmon   Procedures:   Antimicrobials: levaquin    Subjective: Pt appears uncomfortable   Objective: Vitals:   06/29/23 1100 06/29/23 1200 06/29/23 1300 06/29/23 1400  BP: (!) 102/55 (!) 114/57 (!) 128/53 117/67  Pulse: (!) 109 (!) 115 (!) 110 (!) 113  Resp: (!) 26 (!) 25 (!) 25 (!) 23  Temp:  97.7 F (36.5 C)    TempSrc:  Axillary    SpO2: 95% 97% 92% 95%  Weight:      Height:        Intake/Output Summary (Last 24 hours) at 06/29/2023 1457 Last data filed at 06/29/2023 0800 Gross per 24 hour  Intake 221.29 ml  Output 530 ml  Net -308.71 ml   Filed Weights   06/27/23 0500 06/28/23 0500 06/29/23 0500  Weight: 44.6 kg 44.2 kg 42.8 kg    Examination:  General exam: appears uncomfortable  Respiratory system: diminished breath sounds   Cardiovascular system: S1 & S2+ Gastrointestinal system: abd is soft, NT, ND & hypoactive bowel sounds  Central nervous system: lethargic Psychiatry: Judgement and insight appears at baseline. Flat mood and affect    Data Reviewed: I have personally reviewed following labs and imaging studies  CBC: Recent Labs  Lab 06/25/23 0355 06/26/23 0555 06/27/23 0431 06/28/23 0605 06/29/23 0342  WBC 15.8* 15.0* 19.5* 18.1* 18.9*  NEUTROABS  --   --   --   --  17.9*  HGB 8.0* 10.6* 10.7* 11.4* 11.6*  HCT 25.0* 33.4* 33.5* 36.4 37.1  MCV 75.1* 76.8* 74.9* 75.8* 76.2*  PLT 464* 439* 458* 474* 458*   Basic Metabolic Panel: Recent Labs  Lab 06/25/23 0355 06/26/23 0555 06/27/23 0431 06/28/23 0605 06/29/23 0342  NA 136 138 137 139 140  K 3.6 4.5 4.5 4.7 4.3  CL 98 101 101 100 101  CO2 29 28 25 28 28   GLUCOSE 116* 145* 120* 163* 160*  BUN 22 19 19  24* 31*  CREATININE 0.79 0.74 0.75 0.75 0.83  CALCIUM 8.3* 8.5* 8.7* 8.3* 8.6*  MG  --  2.4 2.3 2.6* 2.8*  PHOS  --  2.3* 1.3* 3.7 2.7    GFR: Estimated Creatinine Clearance: 25.9 mL/min (by C-G formula based on SCr of 0.83 mg/dL). Liver Function Tests: Recent Labs  Lab 06/23/23 0420 06/25/23 0355 06/29/23 0342  AST 84* 32 24  ALT 144* 83* 33  ALKPHOS 135* 103 93  BILITOT 0.6 0.5 0.6  PROT 6.8 6.2* 6.9  ALBUMIN 2.6* 2.5* 2.6*   No results for input(s): "LIPASE", "AMYLASE" in the last 168 hours. No results for input(s): "AMMONIA" in the last 168 hours. Coagulation Profile: No results for input(s): "INR", "PROTIME" in the last 168 hours.  Cardiac Enzymes: No results for input(s): "CKTOTAL", "CKMB", "CKMBINDEX", "TROPONINI" in the last 168 hours. BNP (last 3 results) No results for input(s): "PROBNP" in the last 8760 hours. HbA1C: No results for input(s): "HGBA1C" in the last 72 hours. CBG: Recent Labs  Lab 06/28/23 1132 06/28/23 1637 06/28/23 2113 06/29/23 0757 06/29/23 1149  GLUCAP 177* 194* 164* 158* 157*   Lipid Profile: No results for input(s): "CHOL", "HDL", "LDLCALC", "TRIG", "CHOLHDL", "LDLDIRECT" in the last 72 hours. Thyroid Function Tests: No results for input(s): "TSH", "T4TOTAL", "FREET4", "T3FREE", "THYROIDAB" in the last 72 hours. Anemia Panel: No results for input(s): "VITAMINB12", "FOLATE", "FERRITIN", "TIBC", "IRON", "RETICCTPCT" in the last 72 hours.  Sepsis Labs: No results for input(s): "PROCALCITON", "LATICACIDVEN" in the last 168 hours.   Recent Results (from the past 240 hours)  Blood Culture (routine x 2)     Status: None   Collection Time: 06/19/23  7:19 PM   Specimen: BLOOD  Result Value Ref Range Status   Specimen Description BLOOD BLOOD LEFT ARM  Final   Special Requests   Final    BOTTLES DRAWN AEROBIC AND ANAEROBIC Blood Culture adequate volume   Culture   Final    NO GROWTH 5 DAYS Performed at [facility], [address], [place], [place] [address]    Report Status 06/24/2023 FINAL  Final  Blood Culture (routine x 2)     Status: None   Collection  Time: 06/19/23  7:19 PM   Specimen: BLOOD  Result Value Ref Range Status   Specimen Description BLOOD BLOOD RIGHT ARM  Final   Special Requests   Final    BOTTLES DRAWN AEROBIC AND ANAEROBIC Blood Culture adequate volume   Culture   Final    NO GROWTH 5 DAYS Performed at [facility], [address], [place], [place] [address]    Report Status 06/24/2023 FINAL  Final  Resp panel by RT-PCR (RSV, Flu A&B, Covid) Anterior Nasal Swab     Status: None   Collection Time: 06/19/23  8:03 PM   Specimen: Anterior Nasal Swab  Result Value Ref Range Status   SARS Coronavirus 2 by RT PCR NEGATIVE NEGATIVE Final    Comment: (NOTE) SARS-CoV-2 target nucleic acids are NOT DETECTED.  The SARS-CoV-2  RNA is generally detectable in upper respiratory specimens during the acute phase of infection. The lowest concentration of SARS-CoV-2 viral copies this assay can detect is 138 copies/mL. A negative result does not preclude SARS-Cov-2 infection and should not be used as the sole basis for treatment or other patient management decisions. A negative result may occur with  improper specimen collection/handling, submission of specimen other than nasopharyngeal swab, presence of viral mutation(s) within the areas targeted by this assay, and inadequate number of viral copies(<138 copies/mL). A negative result must be combined with clinical observations, patient history, and epidemiological information. The expected result is Negative.  Fact Sheet for Patients:  BloggerCourse.com  Fact Sheet for Healthcare Providers:  SeriousBroker.it  This test is no t yet approved or cleared by the [place] FDA and  has been authorized for detection and/or diagnosis of SARS-CoV-2 by FDA under an Emergency Use Authorization (EUA). This EUA will remain  in effect (meaning this test can be used) for the duration of the COVID-19 declaration under Section  564(b)(1) of the Act, 21 U.S.C.section 360bbb-3(b)(1), unless the authorization is terminated  or revoked sooner.       Influenza A by PCR NEGATIVE NEGATIVE Final   Influenza B by PCR NEGATIVE NEGATIVE Final    Comment: (NOTE) The Xpert Xpress SARS-CoV-2/FLU/RSV plus assay is intended as an aid in the diagnosis of influenza from Nasopharyngeal swab specimens and should not be used as a sole basis for treatment. Nasal washings and aspirates are unacceptable for Xpert Xpress SARS-CoV-2/FLU/RSV testing.  Fact Sheet for Patients: BloggerCourse.com  Fact Sheet for Healthcare Providers: SeriousBroker.it  This test is not yet approved or cleared by the [place] FDA and has been authorized for detection and/or diagnosis of SARS-CoV-2 by FDA under an Emergency Use Authorization (EUA). This EUA will remain in effect (meaning this test can be used) for the duration of the COVID-19 declaration under Section 564(b)(1) of the Act, 21 U.S.C. section 360bbb-3(b)(1), unless the authorization is terminated or revoked.     Resp Syncytial Virus by PCR NEGATIVE NEGATIVE Final    Comment: (NOTE) Fact Sheet for Patients: BloggerCourse.com  Fact Sheet for Healthcare Providers: SeriousBroker.it  This test is not yet approved or cleared by the [place] FDA and has been authorized for detection and/or diagnosis of SARS-CoV-2 by FDA under an Emergency Use Authorization (EUA). This EUA will remain in effect (meaning this test can be used) for the duration of the COVID-19 declaration under Section 564(b)(1) of the Act, 21 U.S.C. section 360bbb-3(b)(1), unless the authorization is terminated or revoked.  Performed at [facility], [address] [place], [place] [address]   Respiratory (~20 pathogens) panel by PCR     Status: None   Collection Time: 06/20/23  9:28 AM   Specimen:  Nasopharyngeal Swab; Respiratory  Result Value Ref Range Status   Adenovirus NOT DETECTED NOT DETECTED Final   Coronavirus 229E NOT DETECTED NOT DETECTED Final    Comment: (NOTE) The Coronavirus on the Respiratory Panel, DOES NOT test for the novel  Coronavirus (2019 nCoV)    Coronavirus HKU1 NOT DETECTED NOT DETECTED Final   Coronavirus NL63 NOT DETECTED NOT DETECTED Final   Coronavirus OC43 NOT DETECTED NOT DETECTED Final   Metapneumovirus NOT DETECTED NOT DETECTED Final   Rhinovirus / Enterovirus NOT DETECTED NOT DETECTED Final   Influenza A NOT DETECTED NOT DETECTED Final   Influenza B NOT DETECTED NOT DETECTED Final   Parainfluenza Virus 1 NOT DETECTED NOT DETECTED  Final   Parainfluenza Virus 2 NOT DETECTED NOT DETECTED Final   Parainfluenza Virus 3 NOT DETECTED NOT DETECTED Final   Parainfluenza Virus 4 NOT DETECTED NOT DETECTED Final   Respiratory Syncytial Virus NOT DETECTED NOT DETECTED Final   Bordetella pertussis NOT DETECTED NOT DETECTED Final   Bordetella Parapertussis NOT DETECTED NOT DETECTED Final   Chlamydophila pneumoniae NOT DETECTED NOT DETECTED Final   Mycoplasma pneumoniae NOT DETECTED NOT DETECTED Final    Comment: Performed at [facility], [address] [place], [place] [address]  MRSA Next Gen by PCR, Nasal     Status: None   Collection Time: 06/20/23  9:28 AM   Specimen: Nasopharyngeal Swab; Nasal Swab  Result Value Ref Range Status   MRSA by PCR Next Gen NOT DETECTED NOT DETECTED Final    Comment: (NOTE) The GeneXpert MRSA Assay (FDA approved for NASAL specimens only), is one component of a comprehensive MRSA colonization surveillance program. It is not intended to diagnose MRSA infection nor to guide or monitor treatment for MRSA infections. Test performance is not FDA approved in patients less than 51 years old. Performed at [facility], [address] [place], [place] [address]   Aspergillus Ag, BAL/Serum     Status: None    Collection Time: 06/23/23 11:37 AM   Specimen: Vein  Result Value Ref Range Status   Aspergillus Ag, BAL/Serum 0.04 0.00 - 0.49 Index Final    Comment: (NOTE) Performed At: [facility] [address] [place], [place] [id number] Jolene Schimke MD VO:[id number]   Acid Fast Smear (AFB)     Status: None   Collection Time: 06/25/23  9:44 AM   Specimen: Sputum  Result Value Ref Range Status   AFB Specimen Processing Concentration  Final   Acid Fast Smear Negative  Final    Comment: (NOTE) Performed At: [facility] [address] [place], [place] [id number] Jolene Schimke MD FI:[id number]    Source (AFB) EXPECTORATED SPUTUM  Final    Comment: Performed at [facility], [address] [place], [place] [address]  Expectorated Sputum Assessment w Gram Stain, Rflx to Resp Cult     Status: None   Collection Time: 06/25/23  9:44 AM   Specimen: SPU  Result Value Ref Range Status   Specimen Description SPUTUM  Final   Special Requests NONE  Final   Sputum evaluation   Final    THIS SPECIMEN IS ACCEPTABLE FOR SPUTUM CULTURE Performed at [facility], [address], [place], [place] [address]    Report Status 06/25/2023 FINAL  Final  Culture, Respiratory w Gram Stain     Status: None   Collection Time: 06/25/23  9:44 AM   Specimen: SPU  Result Value Ref Range Status   Specimen Description   Final    SPUTUM Performed at [facility], [address] [place], [place] [address]    Special Requests   Final    NONE Performed at [facility], [address], [place], [place] [address]    Gram Stain   Final    NO WBC SEEN RARE GRAM POSITIVE COCCI IN PAIRS Performed at [facility], [address] [place], [place] [address]    Culture   Final    FEW ENTEROBACTER CLOACAE MODERATE STAPHYLOCOCCUS EPIDERMIDIS    Report Status 06/29/2023 FINAL  Final   Organism ID, Bacteria ENTEROBACTER CLOACAE  Final   Organism ID, Bacteria  STAPHYLOCOCCUS EPIDERMIDIS  Final  Susceptibility   Enterobacter cloacae - MIC*    CEFEPIME <=0.12 SENSITIVE Sensitive     CEFTAZIDIME <=1 SENSITIVE Sensitive     CIPROFLOXACIN <=0.25 SENSITIVE Sensitive     GENTAMICIN <=1 SENSITIVE Sensitive     IMIPENEM <=0.25 SENSITIVE Sensitive     TRIMETH/SULFA <=20 SENSITIVE Sensitive     PIP/TAZO <=4 SENSITIVE Sensitive ug/mL    * FEW ENTEROBACTER CLOACAE   Staphylococcus epidermidis - MIC*    CIPROFLOXACIN <=0.5 SENSITIVE Sensitive     ERYTHROMYCIN >=8 RESISTANT Resistant     GENTAMICIN <=0.5 SENSITIVE Sensitive     OXACILLIN >=4 RESISTANT Resistant     TETRACYCLINE 2 SENSITIVE Sensitive     VANCOMYCIN 1 SENSITIVE Sensitive     TRIMETH/SULFA 160 RESISTANT Resistant     CLINDAMYCIN <=0.25 SENSITIVE Sensitive     RIFAMPIN <=0.5 SENSITIVE Sensitive     Inducible Clindamycin NEGATIVE Sensitive     * MODERATE STAPHYLOCOCCUS EPIDERMIDIS  MTB-RIF NAA with AFB Culture, sputum (q8 x 3)     Status: None (Preliminary result)   Collection Time: 06/26/23  9:48 AM   Specimen: Sputum  Result Value Ref Range Status   Myco tuberculosis Complex PENDING  Incomplete   Rifampin PENDING  Incomplete   AFB Specimen Processing Concentration  Final    Comment: (NOTE) Performed At: [facility] [address] [place], [place] [id number] Jolene Schimke MD QI:[id number]    Acid Fast Culture PENDING  Incomplete   Source (MTB RIF) EXPECTORATED SPUTUM  Final    Comment: Performed at [facility], [address], [place], [place] [address]         Radiology Studies: No results found.       Scheduled Meds:  ascorbic acid  500 mg Oral BID   Chlorhexidine Gluconate Cloth  6 each Topical Daily   cloNIDine  0.1 mg Transdermal Weekly   dexamethasone (DECADRON) injection  8 mg Intravenous Q12H   docusate sodium  200 mg Oral BID   enoxaparin (LOVENOX) injection  30 mg Subcutaneous Q24H   feeding supplement (KATE FARMS STANDARD 1.4)   325 mL Oral BID BM   ferrous sulfate  325 mg Oral BID WC   furosemide  40 mg Intravenous Daily   insulin aspart  0-15 Units Subcutaneous TID AC & HS   ipratropium-albuterol  3 mL Nebulization QID   levothyroxine  25 mcg Oral Q0600   pantoprazole  40 mg Oral Daily   Continuous Infusions:  levofloxacin (LEVAQUIN) IV Stopped (06/28/23 1316)     LOS: 10 days      Charise Killian, MD [facility] Pager [phone]  If 7PM-7AM, please contact night-coverage [email or web address] 06/29/2023, 2:57 PM

## 2023-06-29 NOTE — Progress Notes (Signed)
Pt noted to have elevated HR and RR and c/o SOB. Provider notified. Order received for Morphine 2mg  IV. Medication administered per EMAR/order. Pt's grandson at bedside and updated.

## 2023-06-30 ENCOUNTER — Inpatient Hospital Stay: Payer: Medicaid Other

## 2023-06-30 DIAGNOSIS — J849 Interstitial pulmonary disease, unspecified: Secondary | ICD-10-CM | POA: Diagnosis not present

## 2023-06-30 DIAGNOSIS — A419 Sepsis, unspecified organism: Secondary | ICD-10-CM | POA: Diagnosis not present

## 2023-06-30 DIAGNOSIS — Z7189 Other specified counseling: Secondary | ICD-10-CM | POA: Diagnosis not present

## 2023-06-30 DIAGNOSIS — J9601 Acute respiratory failure with hypoxia: Secondary | ICD-10-CM | POA: Diagnosis not present

## 2023-06-30 LAB — CBC WITH DIFFERENTIAL/PLATELET
Abs Immature Granulocytes: 0.2 10*3/uL — ABNORMAL HIGH (ref 0.00–0.07)
Basophils Absolute: 0 10*3/uL (ref 0.0–0.1)
Basophils Relative: 0 %
Eosinophils Absolute: 0 10*3/uL (ref 0.0–0.5)
Eosinophils Relative: 0 %
HCT: 37.4 % (ref 36.0–46.0)
Hemoglobin: 11.4 g/dL — ABNORMAL LOW (ref 12.0–15.0)
Immature Granulocytes: 1 %
Lymphocytes Relative: 2 %
Lymphs Abs: 0.4 10*3/uL — ABNORMAL LOW (ref 0.7–4.0)
MCH: 23.8 pg — ABNORMAL LOW (ref 26.0–34.0)
MCHC: 30.5 g/dL (ref 30.0–36.0)
MCV: 78.1 fL — ABNORMAL LOW (ref 80.0–100.0)
Monocytes Absolute: 0.8 10*3/uL (ref 0.1–1.0)
Monocytes Relative: 4 %
Neutro Abs: 19.1 10*3/uL — ABNORMAL HIGH (ref 1.7–7.7)
Neutrophils Relative %: 93 %
Platelets: 447 10*3/uL — ABNORMAL HIGH (ref 150–400)
RBC: 4.79 MIL/uL (ref 3.87–5.11)
RDW: 16.7 % — ABNORMAL HIGH (ref 11.5–15.5)
WBC: 20.4 10*3/uL — ABNORMAL HIGH (ref 4.0–10.5)
nRBC: 0 % (ref 0.0–0.2)

## 2023-06-30 LAB — BASIC METABOLIC PANEL
Anion gap: 14 (ref 5–15)
BUN: 40 mg/dL — ABNORMAL HIGH (ref 8–23)
CO2: 26 mmol/L (ref 22–32)
Calcium: 8.9 mg/dL (ref 8.9–10.3)
Chloride: 105 mmol/L (ref 98–111)
Creatinine, Ser: 0.92 mg/dL (ref 0.44–1.00)
GFR, Estimated: >60 mL/min (ref >60)
Glucose, Bld: 199 mg/dL — ABNORMAL HIGH (ref 70–99)
Potassium: 4 mmol/L (ref 3.5–5.1)
Sodium: 145 mmol/L (ref 135–145)

## 2023-06-30 LAB — GLUCOSE, CAPILLARY
Glucose-Capillary: 193 mg/dL — ABNORMAL HIGH (ref 70–99)
Glucose-Capillary: 198 mg/dL — ABNORMAL HIGH (ref 70–99)
Glucose-Capillary: 227 mg/dL — ABNORMAL HIGH (ref 70–99)
Glucose-Capillary: 161 mg/dL — ABNORMAL HIGH (ref 70–99)

## 2023-06-30 LAB — C-REACTIVE PROTEIN: CRP: 8.5 mg/dL — ABNORMAL HIGH (ref <1.0)

## 2023-06-30 LAB — MAGNESIUM: Magnesium: 2.9 mg/dL — ABNORMAL HIGH (ref 1.7–2.4)

## 2023-06-30 LAB — PHOSPHORUS: Phosphorus: 4.2 mg/dL (ref 2.5–4.6)

## 2023-06-30 MED ORDER — PANTOPRAZOLE SODIUM 40 MG IV SOLR
40.0000 mg | INTRAVENOUS | Status: DC
  Administered 2023-06-30: 40 mg via INTRAVENOUS
  Filled 2023-06-30 (×2): qty 10

## 2023-06-30 MED ORDER — LEVOTHYROXINE SODIUM 100 MCG/5ML IV SOLN: 12.5000 ug | Freq: Every day | INTRAVENOUS | Status: DC

## 2023-06-30 MED ORDER — SODIUM CHLORIDE 0.9 % IV SOLN
100.0000 mg | Freq: Once | INTRAVENOUS | Status: AC
  Administered 2023-06-30: 100 mg via INTRAVENOUS
  Filled 2023-06-30: qty 5

## 2023-06-30 NOTE — Progress Notes (Addendum)
Daily Progress Note   Patient Name: Erin Wagner       Date: 06/30/2023 DOB: Apr 12, 1941  Age: 82 y.o. MRN#: [id number] Attending Physician: Charise Killian, MD Primary Care Physician: Erasmo Downer, MD Admit Date: 06/19/2023  Reason for Consultation/Follow-up: Establishing goals of care  Subjective: Notes and labs reviewed.  In to see patient.  She is currently sitting in bed with high flow cannula in place.  She is awake and looking around the room.  Telemetry interpreter brought into the room and upon activating an interpreter for [place], the system indicated that a visual interpreter was not available and thus connected to [facility] interpreters.  Interpreter [phone] used.  Patient indicates through the interpreter that she would like her son to translate any information for her including information regarding health care.  Confirmed with interpreter that patient does not want to use interpreter service.  Ended call with interpreter.  During this brief interaction, patient had a desaturation and nursing applied a nonrebreather over her high flow cannula for a short duration until patient was able to recover.  Son states patient has advised that she is tired.  He states she no longer is stating that she is hopeful for improvement, and has begun "to pray to God that he will take her".  He states patient is interested in seeing her grand daughter (his daughter) who will be flying in tomorrow.  We discussed pain and suffering and quality of life.  He discusses that after his daughter has been able to visit, other family members arrive, and then they will be interested in shifting to comfort care.  Discussed that PMT will follow-up tomorrow.  Discussed anticipation of a hospital death  if transition to comfort care.  Length of Stay: 11  Current Medications: Scheduled Meds:   ascorbic acid  500 mg Oral BID   Chlorhexidine Gluconate Cloth  6 each Topical Daily   cloNIDine  0.1 mg Transdermal Weekly   dexamethasone (DECADRON) injection  8 mg Intravenous Q12H   docusate sodium  200 mg Oral BID   enoxaparin (LOVENOX) injection  30 mg Subcutaneous Q24H   feeding supplement (KATE FARMS STANDARD 1.4)  325 mL Oral BID BM   furosemide  40 mg Intravenous Daily   insulin aspart  0-15 Units Subcutaneous TID AC & HS  ipratropium-albuterol  3 mL Nebulization BID   [START ON 07/07/2023] levothyroxine  12.5 mcg Intravenous Daily   pantoprazole (PROTONIX) IV  40 mg Intravenous Q24H    Continuous Infusions:  levofloxacin (LEVAQUIN) IV Stopped (06/28/23 1316)    PRN Meds: acetaminophen **OR** acetaminophen, bisacodyl, magnesium hydroxide, [DISCONTINUED] ondansetron **OR** ondansetron (ZOFRAN) IV, mouth rinse, sodium chloride  Physical Exam Constitutional:      Comments: Appears weak, tired, and frail.  Pulmonary:     Comments: On high flow cannula Neurological:     Mental Status: She is alert.             Vital Signs: BP 115/61   Pulse (!) 118   Temp 97.6 F (36.4 C) (Axillary)   Resp (!) 23   Ht 4\' 3"  (1.295 m)   Wt 39.7 kg   SpO2 94%   BMI 23.66 kg/m  SpO2: SpO2: 94 % O2 Device: O2 Device: Heated High Flow Nasal Cannula O2 Flow Rate: O2 Flow Rate (L/min): 45 L/min  Intake/output summary:  Intake/Output Summary (Last 24 hours) at 06/30/2023 1206 Last data filed at 06/30/2023 0545 Gross per 24 hour  Intake --  Output 1025 ml  Net -1025 ml   LBM: Last BM Date : 06/27/23 Baseline Weight: Weight: 46 kg Most recent weight: Weight: 39.7 kg         Patient Active Problem List   Diagnosis Date Noted   Pulmonary hypertension (HCC) 06/24/2023   Iron deficiency anemia 06/24/2023   Septic shock (HCC) 06/23/2023   Myocardial injury 06/23/2023   Uncontrolled  type 2 diabetes mellitus with hyperglycemia, without long-term current use of insulin (HCC) 06/23/2023   Constipation 06/23/2023   Multifocal pneumonia 06/23/2023   Hyponatremia 06/20/2023   Essential hypertension 06/19/2023   Dyslipidemia 06/19/2023   GERD without esophagitis 06/19/2023   Interstitial lung disease (HCC) 06/19/2023   Acute hypoxemic respiratory failure (HCC) 06/19/2023   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   Urge incontinence 04/23/2023   ILD (interstitial lung disease) (HCC) 01/15/2023   Gait difficulty 01/15/2023   Chronic cough 12/06/2022   Bibasilar crackles 08/27/2022   Localized 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 fracture 02/22/2022   Hypothyroidism 05/13/2019   Essential tremor 05/13/2019   Allergic conjunctivitis of both eyes 05/13/2019   Lymphedema 12/13/2017   History of cataract 09/09/2017   Depression 09/09/2017   History of breast cancer 06/27/2017   Diabetes mellitus (HCC) 06/27/2017   Hyperlipidemia associated with type 2 diabetes mellitus (HCC) 06/27/2017   GERD (gastroesophageal reflux disease) 06/27/2017   Hypertension associated with diabetes (HCC) 06/27/2017    Palliative Care Assessment & Plan     Recommendations/Plan: Family planning to transition to comfort care tomorrow after patient's granddaughter has been able to visit.  They advise she should arrive tomorrow around noon.  Code Status:    Code Status Orders  (From admission, onward)           Start     Ordered   06/23/23 1818  Do not attempt resuscitation (DNR)- Limited -Do Not Intubate (DNI)  (Code Status)  Continuous       Question Answer Comment  If pulseless and not breathing No CPR or chest compressions.   In Pre-Arrest Conditions (Patient Is Breathing and Has A Pulse) Do not intubate. Provide all appropriate non-invasive medical interventions. Avoid ICU transfer unless indicated or required.   Consent: Discussion documented in EHR or advanced  directives reviewed      06/23/23 1817  Code Status History     Date Active Date Inactive Code Status Order ID Comments User Context   06/20/2023 0521 06/23/2023 1816 Do not attempt resuscitation (DNR) PRE-ARREST INTERVENTIONS DESIRED [id number]  Hannah Beat, MD ED   06/19/2023 2135 06/20/2023 0521 Full Code [id number]  Mansy, Vernetta Honey, MD ED       Prognosis: Very poor   Care plan was discussed with attending MD and RN  Thank you for allowing the Palliative Medicine Team to assist in the care of this patient.    Morton Stall, NP  Please contact Palliative Medicine Team phone at [phone] for questions and concerns.

## 2023-06-30 NOTE — Progress Notes (Signed)
Upon arrival Erin Wagner was lying in bed. Does not speak [place]. Grandchildren at bedside. Chaplain offered them education on end of life. Family desires Erin Wagner be comfortable and plan to shift to comfort care in the morning. Family has contact to a funeral home to ensure burial is proper.     06/30/23 2000  Spiritual Encounters  Type of Visit Initial  Care provided to: Pt and family  Conversation partners present during encounter Nurse  Referral source Nurse (RN/NT/LPN)  Reason for visit End-of-life  OnCall Visit Yes

## 2023-06-30 NOTE — Progress Notes (Signed)
Pt's grandson assisted pt with drinking water. Pt began coughing per pt's grandson. Pt desat down to 83%. Pt able to recover relatively quickly.

## 2023-06-30 NOTE — Plan of Care (Signed)
  Problem: Clinical Measurements: Goal: Diagnostic test results will improve Outcome: Not Progressing Goal: Signs and symptoms of infection will decrease Outcome: Not Progressing   Problem: Respiratory: Goal: Ability to maintain adequate ventilation will improve Outcome: Not Progressing   Problem: Education: Goal: Ability to describe self-care measures that may prevent or decrease complications (Diabetes Survival Skills Education) will improve Outcome: Not Progressing Goal: Individualized Educational Video(s) Outcome: Not Progressing   Problem: Coping: Goal: Ability to adjust to condition or change in health will improve Outcome: Not Progressing   Problem: Fluid Volume: Goal: Ability to maintain a balanced intake and output will improve Outcome: Not Progressing   Problem: Health Behavior/Discharge Planning: Goal: Ability to identify and utilize available resources and services will improve Outcome: Not Progressing Goal: Ability to manage health-related needs will improve Outcome: Not Progressing   Problem: Metabolic: Goal: Ability to maintain appropriate glucose levels will improve Outcome: Not Progressing   Problem: Nutritional: Goal: Maintenance of adequate nutrition will improve Outcome: Not Progressing Goal: Progress toward achieving an optimal weight will improve Outcome: Not Progressing   Problem: Skin Integrity: Goal: Risk for impaired skin integrity will decrease Outcome: Not Progressing   Problem: Tissue Perfusion: Goal: Adequacy of tissue perfusion will improve Outcome: Not Progressing   Problem: Education: Goal: Knowledge of General Education information will improve Description: Including pain rating scale, medication(s)/side effects and non-pharmacologic comfort measures Outcome: Not Progressing   Problem: Clinical Measurements: Goal: Ability to maintain clinical measurements within normal limits will improve Outcome: Not Progressing Goal: Will  remain free from infection Outcome: Not Progressing Goal: Diagnostic test results will improve Outcome: Not Progressing Goal: Respiratory complications will improve Outcome: Not Progressing Goal: Cardiovascular complication will be avoided Outcome: Not Progressing   Problem: Nutrition: Goal: Adequate nutrition will be maintained Outcome: Not Progressing   Problem: Coping: Goal: Level of anxiety will decrease Outcome: Not Progressing   Problem: Elimination: Goal: Will not experience complications related to bowel motility Outcome: Not Progressing Goal: Will not experience complications related to urinary retention Outcome: Not Progressing   Problem: Safety: Goal: Ability to remain free from injury will improve Outcome: Not Progressing   Problem: Pain Management: Goal: General experience of comfort will improve Outcome: Not Progressing   Problem: Skin Integrity: Goal: Risk for impaired skin integrity will decrease Outcome: Not Progressing

## 2023-06-30 NOTE — Progress Notes (Signed)
NAME:  Erin Wagner, MRN:  [id number], DOB:  02/26/1941, LOS: 11 ADMISSION DATE:  06/19/2023, CHIEF COMPLAINT:  Respiratory Failure   History of Present Illness:   82 year old female with history of ILD and non-CF bronchiectasis presenting to the hospital with increased shortness of breath and severe hypoxemia.    Pertinent  Medical History   Patient's past medical history includes breast cancer status post bilateral mastectomy and chemoradiation. She also has a history of depression and anxiety  Interim History / Subjective:   06/25/23- Did develop increased shortness of breath yesterday she is getting worse and is at high risk of death currently on maximal settings on HFNC 95%/60L/min .  Reviewed imaging with family  06/26/23- patient is improved, s/p pRBC transfusion now more stable able to speak and have weaned O2 to 80%.  Provided plant based diet. Lengthy discussion with family.  Palliative care consultation ordered.  06/27/23- patient had first meal after week of being on 100%HFNC and non rebreather.  After interval improvement and nourishemnent had developed electrolyte distrubances consistent with re-feeding.  Pharmacy consultation and repletion in process. She is on 85%HFNC without non-rebreather.  She is conversive.  Family at bedside I met with them few times today. Palliative meeting in process I encouraged patient to continue to work towards improvement as she expressed wishes to hopefully go back to [place] to see remainder of her family once more. We will continue to support her maximally on medical management.  06/28/23- patient with no overnight events.  Has abnormal respiratory cultures with eneterobacter.  Reviewed with pharmacist and will utilize levoquin per sensitivity panel.  FiO2 at 76%/40. Met with family multiple times for update and medical plan.  06/29/23- patient remains hypoxemic on HFNC, she was able to drink some fluids today.  She is not as tachypneic  or tachycardic post metoprolol and low dose clonidine patch initiated yesterday.  Met with family today multiple times. CRP treding down, remains on levofloxacin and decadron BID 06/30/23- patient started feeling better yesterday evening.  She started eating but reported dysphagia.  Her son explained this is not new, she had acute event after this and became acutely short of breath with increased O2 req.  Son stated he brought in from home a milk product for her to drink.  She may have aspirated during this meal.  We discussed this and he knows that she should only have hospital nourishment going forward.  Despite this she was able to speak in full sentences.  Her inflammatory biomarkers are trending down.  Her daughter is flying down from [place] to visit her.   Objective   Blood pressure (!) 152/68, pulse (!) 112, temperature (!) 97.1 F (36.2 C), temperature source Axillary, resp. rate (!) 23, height 4\' 3"  (1.295 m), weight 39.7 kg, SpO2 98%.    FiO2 (%):  [98 %] 98 %   Intake/Output Summary (Last 24 hours) at 06/30/2023 1033 Last data filed at 06/30/2023 0545 Gross per 24 hour  Intake --  Output 1025 ml  Net -1025 ml   Filed Weights   06/28/23 0500 06/29/23 0500 06/30/23 0400  Weight: 44.2 kg 42.8 kg 39.7 kg    Examination: Physical Exam Constitutional:      General: She is not in acute distress.    Appearance: She is ill-appearing.  Cardiovascular:     Rate and Rhythm: Normal rate and regular rhythm.  Pulmonary:     Effort: Respiratory distress present.  Breath sounds: Rhonchi and rales present. No wheezing.  Neurological:     General: No focal deficit present.     Mental Status: She is alert. Mental status is at baseline.     Motor: Weakness present.      IMAGING       REVIEWED BY ME INDEPENDENTLY     Contains abnormal data C-reactive protein Order: [id number]  Status: Final result     Next appt: 09/02/2023 at 03:20 PM in Family Medicine Shirlee Latch, MD)    Test Result Released: No (scheduled for 06/30/2023 11:06 AM)   0 Result Notes         Component Ref Range & Units (hover) 04:33 1 d ago 2 d ago 3 d ago 4 d ago 5 d ago  CRP 8.5 High  14.5 High  CM 17.9 High  CM 13.8 High  CM 19.4 High  CM 11.6 High  CM  Comment: Performed at [facility], [address], [place], [place] [address]  Resulting Agency [facility]       Assessment & Plan:   #Acute on Chronic Hypoxic Respiratory Failure #ILD exacerbation Per imaging no biopsy done in the past Vs bronchiectasis exacerbation due to Enterobacter colacae pneumonia- reviewed with pharmacy - cefepime did not tolerate due to AMS, levofloxacin per sensetivity. -Multi drug resistant Staph epidermitis- unlikely contaminant due to high quality culture and very weak/immunocompromised patient with advanced age highly susceptible to infection.         Infectious workup as below  -Respiratory viral panel- negative -serum fungitell- negative  -aspergillus - negative  -strep pneumoniae ur AG-negative  -Histoplasma Ur Ag -sputum resp cultures- abnormal -AFB sputum expectorated specimen- in process  -reviewed pertinent imaging with patient and family  -  CRP trend -please encourage patient to use incentive spirometer few times each hour while hospitalized.   -MRSA screening Currently on levofloxacin -wean FiO2 with goal spO2 >90%  Type 2 NSTEMI  No Acute PE s/p CTPE , severe elevation of BNP.   -S/pTTE with no RV failure/McCollum sign -active cardiac disease with demand ischemia with elevated troponin initially  Severe microcytic anemia  -due to iron deficiency - definitely contributing to dyspnea and should be repleted -Feosol and vitamin C today  -s/p prbc transfusion   Moderate protein calorie malnutrition  - bitemporal wasting and peipheral muscle wasting -had RD nutritional evaluation  -s/p vegan nourishment  -   Refeeding syndrome  - patient with severe electrolyte derrangement- repletion in process   Labs   CBC: Recent Labs  Lab 06/26/23 0555 06/27/23 0431 06/28/23 0605 06/29/23 0342 06/30/23 0433  WBC 15.0* 19.5* 18.1* 18.9* 20.4*  NEUTROABS  --   --   --  17.9* 19.1*  HGB 10.6* 10.7* 11.4* 11.6* 11.4*  HCT 33.4* 33.5* 36.4 37.1 37.4  MCV 76.8* 74.9* 75.8* 76.2* 78.1*  PLT 439* 458* 474* 458* 447*    Basic Metabolic Panel: Recent Labs  Lab 06/26/23 0555 06/27/23 0431 06/28/23 0605 06/29/23 0342 06/30/23 0433  NA 138 137 139 140 145  K 4.5 4.5 4.7 4.3 4.0  CL 101 101 100 101 105  CO2 28 25 28 28 26   GLUCOSE 145* 120* 163* 160* 199*  BUN 19 19 24* 31* 40*  CREATININE 0.74 0.75 0.75 0.83 0.92  CALCIUM 8.5* 8.7* 8.3* 8.6* 8.9  MG 2.4 2.3 2.6* 2.8* 2.9*  PHOS  2.3* 1.3* 3.7 2.7 4.2   GFR: Estimated Creatinine Clearance: 22.5 mL/min (by C-G formula based on SCr of 0.92 mg/dL). Recent Labs  Lab 06/27/23 0431 06/28/23 0605 06/29/23 0342 06/30/23 0433  WBC 19.5* 18.1* 18.9* 20.4*    Liver Function Tests: Recent Labs  Lab 06/25/23 0355 06/29/23 0342  AST 32 24  ALT 83* 33  ALKPHOS 103 93  BILITOT 0.5 0.6  PROT 6.2* 6.9  ALBUMIN 2.5* 2.6*   No results for input(s): "LIPASE", "AMYLASE" in the last 168 hours. No results for input(s): "AMMONIA" in the last 168 hours.  ABG    Component Value Date/Time   PHART 7.3 (L) 06/20/2023 1829   PCO2ART 30 (L) 06/20/2023 1829   PO2ART 69 (L) 06/20/2023 1829   HCO3 27.8 06/21/2023 0546   ACIDBASEDEF 10.3 (H) 06/20/2023 1829   O2SAT 86.9 06/21/2023 0546     Coagulation Profile: No results for input(s): "INR", "PROTIME" in the last 168 hours.   Cardiac Enzymes: No results for input(s): "CKTOTAL", "CKMB", "CKMBINDEX", "TROPONINI" in the last 168 hours.  HbA1C: Hgb A1c MFr Bld  Date/Time Value Ref Range Status  02/18/2023 03:43 PM 6.1 (H) 4.8 - 5.6 % Final    Comment:             Prediabetes: 5.7 - 6.4           Diabetes: >6.4          Glycemic control for adults with diabetes: <7.0   08/27/2022 04:35 PM 6.6 (H) 4.8 - 5.6 % Final    Comment:             Prediabetes: 5.7 - 6.4          Diabetes: >6.4          Glycemic control for adults with diabetes: <7.0     CBG: Recent Labs  Lab 06/29/23 0757 06/29/23 1149 06/29/23 1629 06/29/23 2134 06/30/23 0748  GLUCAP 158* 157* 173* 162* 193*    Past Medical History:  She,  has a past medical history of Breast cancer (HCC), Cataract, Diabetes mellitus without complication (HCC), Hyperlipidemia, Hypertension, and Thyroid disease.   Surgical History:   Past Surgical History:  Procedure Laterality Date   BREAST SURGERY     EYE SURGERY       Social History:   reports that she has never smoked. She has never used smokeless tobacco. She reports that she does not drink alcohol and does not use drugs.   Family History:  Her family history includes Healthy in her brother, daughter, daughter, daughter, sister, and sister.   Allergies No Known Allergies   Home Medications  Prior to Admission medications   Medication Sig Start Date End Date Taking? Authorizing Provider  alendronate (FOSAMAX) 70 MG tablet Take 1 tablet (70 mg total) by mouth every 7 (seven) days. Take with a full glass of water on an empty stomach. 02/05/23  Yes Simmons-Robinson, Makiera, MD  atorvastatin (LIPITOR) 20 MG tablet Take 1 tablet (20 mg total) by mouth daily. 08/27/22  Yes Bacigalupo, Marzella Schlein, MD  budesonide (PULMICORT) 0.5 MG/2ML nebulizer solution Take 2 mLs (0.5 mg total) by nebulization in the morning and at bedtime. 06/18/23 06/17/24 Yes Dgayli, Lianne Bushy, MD  Calcium Carbonate-Vitamin D 600-400 MG-UNIT tablet Take 1 tablet by mouth daily.    Yes [provider]  famotidine (PEPCID) 20 MG tablet Take 1 tablet (20 mg total) by mouth 2 (two) times daily. 01/01/23 01/01/24 Yes Bacigalupo, Marzella Schlein, MD  loratadine (CLARITIN) 10 MG tablet Take 1 tablet (10 mg  total) by mouth daily. 04/08/23 04/07/24 Yes Dgayli, Lianne Bushy, MD  metFORMIN (GLUCOPHAGE) 500 MG tablet Take 1 tablet (500 mg total) by mouth 2 (two) times daily with a meal. 03/14/23  Yes Bacigalupo, Marzella Schlein, MD  omeprazole (PRILOSEC) 20 MG capsule Take 1 capsule (20 mg total) by mouth daily. 06/05/23  Yes Jacky Kindle, FNP  propranolol (INDERAL) 20 MG tablet Take 1 tablet (20 mg total) by mouth 2 (two) times daily. 04/22/23 10/19/23 Yes Bacigalupo, Marzella Schlein, MD  sertraline (ZOLOFT) 25 MG tablet Take 1 tablet (25 mg total) by mouth daily. 04/19/23  Yes Bacigalupo, Marzella Schlein, MD     Critical care provider statement:   Total critical care time: 68 minutes   Performed by: Karna Christmas MD   Critical care time was exclusive of separately billable procedures and treating other patients.   Critical care was necessary to treat or prevent imminent or life-threatening deterioration.   Critical care was time spent personally by me on the following activities: development of treatment plan with patient and/or surrogate as well as nursing, discussions with consultants, evaluation of patient's response to treatment, examination of patient, obtaining history from patient or surrogate, ordering and performing treatments and interventions, ordering and review of laboratory studies, ordering and review of radiographic studies, pulse oximetry and re-evaluation of patient's condition.    Vida Rigger, M.D.  Pulmonary & Critical Care Medicine

## 2023-06-30 NOTE — Plan of Care (Signed)
  Problem: Fluid Volume: Goal: Hemodynamic stability will improve Outcome: Progressing   Problem: Clinical Measurements: Goal: Diagnostic test results will improve Outcome: Progressing Goal: Signs and symptoms of infection will decrease Outcome: Progressing   Problem: Respiratory: Goal: Ability to maintain adequate ventilation will improve Outcome: Not Progressing   Problem: Education: Goal: Ability to describe self-care measures that may prevent or decrease complications (Diabetes Survival Skills Education) will improve Outcome: Progressing Goal: Individualized Educational Video(s) Outcome: Progressing   Problem: Coping: Goal: Ability to adjust to condition or change in health will improve Outcome: Not Progressing   Problem: Fluid Volume: Goal: Ability to maintain a balanced intake and output will improve Outcome: Progressing   Problem: Health Behavior/Discharge Planning: Goal: Ability to identify and utilize available resources and services will improve Outcome: Progressing Goal: Ability to manage health-related needs will improve Outcome: Progressing   Problem: Metabolic: Goal: Ability to maintain appropriate glucose levels will improve Outcome: Progressing   Problem: Nutritional: Goal: Maintenance of adequate nutrition will improve Outcome: Progressing Goal: Progress toward achieving an optimal weight will improve Outcome: Not Progressing   Problem: Skin Integrity: Goal: Risk for impaired skin integrity will decrease Outcome: Progressing   Problem: Tissue Perfusion: Goal: Adequacy of tissue perfusion will improve Outcome: Progressing   Problem: Education: Goal: Knowledge of General Education information will improve Description: Including pain rating scale, medication(s)/side effects and non-pharmacologic comfort measures Outcome: Progressing   Problem: Health Behavior/Discharge Planning: Goal: Ability to manage health-related needs will improve Outcome:  Progressing   Problem: Clinical Measurements: Goal: Ability to maintain clinical measurements within normal limits will improve Outcome: Not Progressing Goal: Will remain free from infection Outcome: Progressing Goal: Diagnostic test results will improve Outcome: Progressing Goal: Respiratory complications will improve Outcome: Not Progressing Goal: Cardiovascular complication will be avoided Outcome: Progressing   Problem: Activity: Goal: Risk for activity intolerance will decrease Outcome: Not Progressing   Problem: Nutrition: Goal: Adequate nutrition will be maintained Outcome: Progressing   Problem: Coping: Goal: Level of anxiety will decrease Outcome: Progressing   Problem: Elimination: Goal: Will not experience complications related to bowel motility Outcome: Progressing Goal: Will not experience complications related to urinary retention Outcome: Progressing   Problem: Pain Management: Goal: General experience of comfort will improve Outcome: Progressing   Problem: Safety: Goal: Ability to remain free from injury will improve Outcome: Progressing   Problem: Skin Integrity: Goal: Risk for impaired skin integrity will decrease Outcome: Progressing

## 2023-06-30 NOTE — Progress Notes (Signed)
Patient alert in bed, follows commands with family translating at bedside. Patient able to speak briefly, desats. Patient unable to tolerate large movements. Patient desats to 57s with HHF and NRB in place with minimal movement.   Son at bedside, Attempting to utilize tele-interpreter. Patient struggling to speak with interpreter, desats with effort to speak. Requiring NRB over HHF.  Patient states she wishes to use her son as interpreter instead of tele-interpreter.    Patient taking minimal sips with family on spoon, tolerating fair.   BP trending up, MD aware.  Son states patient says "she is ready to leave it to God, she is tired. States "she is tired of fighting". Family states wishes to transition to comfort care once granddaughter gets here and family arrive in morning.

## 2023-06-30 NOTE — Progress Notes (Addendum)
PROGRESS NOTE   HPI was taken from Dr. Arville Care: Erin Wagner is a 82 y.o. female with medical history significant for breast cancer in remission on tamoxifen, hypertension, dyslipidemia, interstitial lung disease and hypothyroidism, who presented to the emergency room with acute onset of cough and dyspnea last 7 days.  She has been having increased work of breathing productive cough after having taking a recent steroid taper.  No fever or chills.  No nausea or vomiting or abdominal pain.  No excessive wheezing.  No chest pain or palpitations.  No dysuria, oliguria or hematuria or flank pain.  She admitted to generalized weakness.  No bleeding diathesis.   ED Course: When she came to the ER, BP was 90/50 with respiratory to 36 with pulse oximetry of 86% on room air.  Labs revealed hyponatremia 127 hypochloremia of 97, hyperkalemia of 5.2, blood glucose of 182, BUN of 29 creatinine 1.2 above previous levels, albumin 3 and total protein 7.5, AST 89 and ALT 53 and high sensitive troponin I was 104 and later 91.  Lactic acid was 4.7 and later 3.2.  CBC showed leukocytosis 13.5 and anemia with hemoglobin 8.8 hematocrit 27.6 with microcytosis lower than in August and thrombocytosis of 488.  PTT was 39 with normal INR and PTT.  TSH was 2.165 and free T41.75.  Respiratory panel came back negative.  Blood cultures were drawn. EKG as reviewed by me : EKG showed normal sinus rhythm with rate of 94 with poor R wave progression with T wave inversion anterolaterally and inferiorly. Imaging: Portable chest x-ray showed the following: 1. Bilateral peripheral reticulations with diffusely increased patchy opacities, left-greater-than-right, which may represent atypical infection superimposed on chronic interstitial lung disease. 2. Mild cardiomegaly.   The patient was given IV vancomycin, cefepime and Zithromax, Pulmicort, 100 mg of IV Solu-Medrol and 1.5 L of IV normal saline.  She will be admitted to a  medical telemetry bed for further evaluation and management.  As per Dr. Mayford Knife 12/18-12/22: Unfortunately, pt's respiratory status has not made much improvement. On IV levaquin, IV steroids, bronchodilators and pt still struggles to breath. Pt is waiting on her granddaughter to arrive from out of town tomorrow and will likely switch over to comfort care tomorrow. Pulmon and palliative care following and recs apprec    Erin Wagner  ZOX:[id number] DOB: May 06, 1941 DOA: 06/19/2023 PCP: Erasmo Downer, MD   Assessment & Plan:   Principal Problem:   Acute hypoxemic respiratory failure (HCC) Active Problems:   Septic shock (HCC)   Pulmonary hypertension (HCC)   Interstitial lung disease (HCC)   Essential hypertension   Hyponatremia   Hypothyroidism   Myocardial injury   History of breast cancer   Depression   Dyslipidemia   GERD without esophagitis   Uncontrolled type 2 diabetes mellitus with hyperglycemia, without long-term current use of insulin (HCC)   Constipation   Multifocal pneumonia   Iron deficiency anemia  Assessment and Plan: Sepsis: met criteria w/ tachypnea, leukocytosis & pneumonia. Continue on levaquin, bronchodilators. Pulmon following and recs apprec   Acute hypoxic respiratory failure: secondary to pneumonia. Continue bronchodilators and wean as tolerated. Prognosis is poor and high risk for further decline. Palliative care is following and recs apprec    Pneumonia: continue on IV levaquin, bronchodilators. Pulmon following and recs apprecs    Hyponatremia: resolved   Elevated troponin: likely secondary to demand ischemia in setting of sepsis    Hypothyroidism: continue on levothyroxine    DM2: well controlled,  HbA1c 6.1. Holding home metformin. Continue on SSI w/ accuchecks    Transaminitis: likely secondary to sepsis. No acute findings on CT abd/pelvis. US gallbladder neg suspicious features to suggest cholecystitis, neg for gallstones or  biliary dilatation    HTN: metoprolol prn    ILD: continue on bronchodilators. Continue w/ supportive care. Pulmon fibrosis as per CT chest. Pulmon following and recs apprecPrognosis is poor and high risk for further decline. Palliative care is following and recs apprec   GERD: continue on PPI    HLD: not on a statin as per med rec    Depression: severity unknown. Holding home dose of sertraline as per pulmon recs      DVT prophylaxis: \lovenox Code Status: DNR Family Communication: discussed pt's care w/ pt's fam at bedside and answered their questions  Disposition Plan:  unclear  Level of care: Stepdown Status is: Inpatient Remains inpatient appropriate because: severity of illness,     Consultants:  Pulmon   Procedures:   Antimicrobials: levaquin    Subjective: Pt appears uncomfortable   Objective: Vitals:   06/30/23 0800 06/30/23 0900 06/30/23 1000 06/30/23 1100  BP: (!) 152/68 (!) 150/76 100/60 115/61  Pulse: (!) 112 (!) 120 (!) 119 (!) 118  Resp: (!) 23 (!) 26 (!) 27 (!) 23  Temp: 97.6 F (36.4 C)     TempSrc: Axillary     SpO2: 98% 98% 97% 94%  Weight:      Height:        Intake/Output Summary (Last 24 hours) at 06/30/2023 1157 Last data filed at 06/30/2023 0545 Gross per 24 hour  Intake --  Output 1025 ml  Net -1025 ml   Filed Weights   06/28/23 0500 06/29/23 0500 06/30/23 0400  Weight: 44.2 kg 42.8 kg 39.7 kg    Examination:  General exam: appears uncomfortable  Respiratory system: decreased breath sounds b/l  Cardiovascular system: S1/S2+ Gastrointestinal system: abd is soft, NT, ND & hypoactive bowel sounds Central nervous system: lethargic. Moves all extremities Psychiatry: judgement and insight appears at baseline. Flat mood and affect    Data Reviewed: I have personally reviewed following labs and imaging studies  CBC: Recent Labs  Lab 06/26/23 0555 06/27/23 0431 06/28/23 0605 06/29/23 0342 06/30/23 0433  WBC 15.0*  19.5* 18.1* 18.9* 20.4*  NEUTROABS  --   --   --  17.9* 19.1*  HGB 10.6* 10.7* 11.4* 11.6* 11.4*  HCT 33.4* 33.5* 36.4 37.1 37.4  MCV 76.8* 74.9* 75.8* 76.2* 78.1*  PLT 439* 458* 474* 458* 447*   Basic Metabolic Panel: Recent Labs  Lab 06/26/23 0555 06/27/23 0431 06/28/23 0605 06/29/23 0342 06/30/23 0433  NA 138 137 139 140 145  K 4.5 4.5 4.7 4.3 4.0  CL 101 101 100 101 105  CO2 28 25 28 28 26   GLUCOSE 145* 120* 163* 160* 199*  BUN 19 19 24* 31* 40*  CREATININE 0.74 0.75 0.75 0.83 0.92  CALCIUM 8.5* 8.7* 8.3* 8.6* 8.9  MG 2.4 2.3 2.6* 2.8* 2.9*  PHOS 2.3* 1.3* 3.7 2.7 4.2   GFR: Estimated Creatinine Clearance: 22.5 mL/min (by C-G formula based on SCr of 0.92 mg/dL). Liver Function Tests: Recent Labs  Lab 06/25/23 0355 06/29/23 0342  AST 32 24  ALT 83* 33  ALKPHOS 103 93  BILITOT 0.5 0.6  PROT 6.2* 6.9  ALBUMIN 2.5* 2.6*   No results for input(s): "LIPASE", "AMYLASE" in the last 168 hours. No results for input(s): "AMMONIA" in the last 168 hours.  Coagulation Profile: No results for input(s): "INR", "PROTIME" in the last 168 hours.  Cardiac Enzymes: No results for input(s): "CKTOTAL", "CKMB", "CKMBINDEX", "TROPONINI" in the last 168 hours. BNP (last 3 results) No results for input(s): "PROBNP" in the last 8760 hours. HbA1C: No results for input(s): "HGBA1C" in the last 72 hours. CBG: Recent Labs  Lab 06/29/23 1149 06/29/23 1629 06/29/23 2134 06/30/23 0748 06/30/23 1123  GLUCAP 157* 173* 162* 193* 198*   Lipid Profile: No results for input(s): "CHOL", "HDL", "LDLCALC", "TRIG", "CHOLHDL", "LDLDIRECT" in the last 72 hours. Thyroid Function Tests: No results for input(s): "TSH", "T4TOTAL", "FREET4", "T3FREE", "THYROIDAB" in the last 72 hours. Anemia Panel: No results for input(s): "VITAMINB12", "FOLATE", "FERRITIN", "TIBC", "IRON", "RETICCTPCT" in the last 72 hours.  Sepsis Labs: No results for input(s): "PROCALCITON", "LATICACIDVEN" in the last 168  hours.   Recent Results (from the past 240 hours)  Aspergillus Ag, BAL/Serum     Status: None   Collection Time: 06/23/23 11:37 AM   Specimen: Vein  Result Value Ref Range Status   Aspergillus Ag, BAL/Serum 0.04 0.00 - 0.49 Index Final    Comment: (NOTE) Performed At: [facility] [address] [place], [place] [id number] Jolene Schimke MD ZY:[id number]   Acid Fast Smear (AFB)     Status: None   Collection Time: 06/25/23  9:44 AM   Specimen: Sputum  Result Value Ref Range Status   AFB Specimen Processing Concentration  Final   Acid Fast Smear Negative  Final    Comment: (NOTE) Performed At: [facility] [address] [place], [place] [id number] Jolene Schimke MD KG:[id number]    Source (AFB) EXPECTORATED SPUTUM  Final    Comment: Performed at [facility], [address] [place], [place] [address]  Expectorated Sputum Assessment w Gram Stain, Rflx to Resp Cult     Status: None   Collection Time: 06/25/23  9:44 AM   Specimen: SPU  Result Value Ref Range Status   Specimen Description SPUTUM  Final   Special Requests NONE  Final   Sputum evaluation   Final    THIS SPECIMEN IS ACCEPTABLE FOR SPUTUM CULTURE Performed at [facility], [address], [place], [place] [address]    Report Status 06/25/2023 FINAL  Final  Culture, Respiratory w Gram Stain     Status: None   Collection Time: 06/25/23  9:44 AM   Specimen: SPU  Result Value Ref Range Status   Specimen Description   Final    SPUTUM Performed at [facility], [address], [place], [place] [address]    Special Requests   Final    NONE Performed at [facility], [address] [place], [place] [address]    Gram Stain   Final    NO WBC SEEN RARE GRAM POSITIVE COCCI IN PAIRS Performed at [facility], [address] [place], [place] [address]    Culture   Final    FEW ENTEROBACTER CLOACAE MODERATE STAPHYLOCOCCUS EPIDERMIDIS    Report Status  06/29/2023 FINAL  Final   Organism ID, Bacteria ENTEROBACTER CLOACAE  Final   Organism ID, Bacteria STAPHYLOCOCCUS EPIDERMIDIS  Final      Susceptibility   Enterobacter cloacae - MIC*    CEFEPIME <=0.12 SENSITIVE Sensitive     CEFTAZIDIME <=1 SENSITIVE Sensitive     CIPROFLOXACIN <=0.25 SENSITIVE Sensitive     GENTAMICIN <=1 SENSITIVE Sensitive     IMIPENEM <=0.25 SENSITIVE Sensitive     TRIMETH/SULFA <=20  SENSITIVE Sensitive     PIP/TAZO <=4 SENSITIVE Sensitive ug/mL    * FEW ENTEROBACTER CLOACAE   Staphylococcus epidermidis - MIC*    CIPROFLOXACIN <=0.5 SENSITIVE Sensitive     ERYTHROMYCIN >=8 RESISTANT Resistant     GENTAMICIN <=0.5 SENSITIVE Sensitive     OXACILLIN >=4 RESISTANT Resistant     TETRACYCLINE 2 SENSITIVE Sensitive     VANCOMYCIN 1 SENSITIVE Sensitive     TRIMETH/SULFA 160 RESISTANT Resistant     CLINDAMYCIN <=0.25 SENSITIVE Sensitive     RIFAMPIN <=0.5 SENSITIVE Sensitive     Inducible Clindamycin NEGATIVE Sensitive     * MODERATE STAPHYLOCOCCUS EPIDERMIDIS  MTB-RIF NAA with AFB Culture, sputum (q8 x 3)     Status: None (Preliminary result)   Collection Time: 06/26/23  9:48 AM   Specimen: Sputum  Result Value Ref Range Status   Myco tuberculosis Complex PENDING  Incomplete   Rifampin PENDING  Incomplete   AFB Specimen Processing Concentration  Final    Comment: (NOTE) Performed At: [facility] [address] [place], [place] [id number] Jolene Schimke MD JJ:[id number]    Acid Fast Culture PENDING  Incomplete   Source (MTB RIF) EXPECTORATED SPUTUM  Final    Comment: Performed at [facility], [address] [place], [place] [address]         Radiology Studies: No results found.       Scheduled Meds:  ascorbic acid  500 mg Oral BID   Chlorhexidine Gluconate Cloth  6 each Topical Daily   cloNIDine  0.1 mg Transdermal Weekly   dexamethasone (DECADRON) injection  8 mg Intravenous Q12H   docusate sodium  200 mg Oral BID    enoxaparin (LOVENOX) injection  30 mg Subcutaneous Q24H   feeding supplement (KATE FARMS STANDARD 1.4)  325 mL Oral BID BM   furosemide  40 mg Intravenous Daily   insulin aspart  0-15 Units Subcutaneous TID AC & HS   ipratropium-albuterol  3 mL Nebulization BID   [START ON 07/07/2023] levothyroxine  12.5 mcg Intravenous Daily   pantoprazole (PROTONIX) IV  40 mg Intravenous Q24H   Continuous Infusions:  levofloxacin (LEVAQUIN) IV Stopped (06/28/23 1316)     LOS: 11 days      Charise Killian, MD [facility] Pager [phone]  If 7PM-7AM, please contact night-coverage [email or web address] 06/30/2023, 11:57 AM

## 2023-07-01 DIAGNOSIS — Z7189 Other specified counseling: Secondary | ICD-10-CM | POA: Diagnosis not present

## 2023-07-01 DIAGNOSIS — J189 Pneumonia, unspecified organism: Secondary | ICD-10-CM | POA: Diagnosis not present

## 2023-07-01 DIAGNOSIS — J9601 Acute respiratory failure with hypoxia: Secondary | ICD-10-CM | POA: Diagnosis not present

## 2023-07-01 DIAGNOSIS — Z515 Encounter for palliative care: Secondary | ICD-10-CM | POA: Diagnosis not present

## 2023-07-01 DIAGNOSIS — E038 Other specified hypothyroidism: Secondary | ICD-10-CM

## 2023-07-01 DIAGNOSIS — A419 Sepsis, unspecified organism: Secondary | ICD-10-CM | POA: Diagnosis not present

## 2023-07-01 LAB — C-REACTIVE PROTEIN: CRP: 6.4 mg/dL — ABNORMAL HIGH (ref <1.0)

## 2023-07-01 LAB — GLUCOSE, CAPILLARY: Glucose-Capillary: 196 mg/dL — ABNORMAL HIGH (ref 70–99)

## 2023-07-01 MED ORDER — HALOPERIDOL LACTATE 5 MG/ML IJ SOLN: 0.5000 mg | INTRAMUSCULAR | Status: DC | PRN

## 2023-07-01 MED ORDER — GLYCOPYRROLATE 1 MG PO TABS: 1.0000 mg | ORAL_TABLET | ORAL | Status: DC | PRN

## 2023-07-01 MED ORDER — LORAZEPAM 1 MG PO TABS: 1.0000 mg | ORAL_TABLET | ORAL | Status: DC | PRN

## 2023-07-01 MED ORDER — LORAZEPAM 2 MG/ML IJ SOLN
1.0000 mg | INTRAMUSCULAR | Status: DC | PRN
  Administered 2023-07-01: 1 mg via INTRAVENOUS
  Filled 2023-07-01: qty 1

## 2023-07-01 MED ORDER — MORPHINE 100MG IN NS 100ML (1MG/ML) PREMIX INFUSION
1.0000 mg/h | INTRAVENOUS | Status: DC
  Administered 2023-07-01: 3 mg/h via INTRAVENOUS
  Filled 2023-07-01: qty 100

## 2023-07-01 MED ORDER — POLYVINYL ALCOHOL 1.4 % OP SOLN: 1.0000 [drp] | Freq: Four times a day (QID) | OPHTHALMIC | Status: DC | PRN

## 2023-07-01 MED ORDER — GLYCOPYRROLATE 0.2 MG/ML IJ SOLN: 0.2000 mg | INTRAMUSCULAR | Status: DC | PRN

## 2023-07-01 MED ORDER — BIOTENE DRY MOUTH MT LIQD: 15.0000 mL | OROMUCOSAL | Status: DC | PRN

## 2023-07-01 MED ORDER — HALOPERIDOL LACTATE 2 MG/ML PO CONC: 0.5000 mg | ORAL | Status: DC | PRN

## 2023-07-01 MED ORDER — HALOPERIDOL 0.5 MG PO TABS: 0.5000 mg | ORAL_TABLET | ORAL | Status: DC | PRN

## 2023-07-01 MED ORDER — MORPHINE SULFATE (PF) 2 MG/ML IV SOLN: 2.0000 mg | INTRAVENOUS | Status: DC | PRN

## 2023-07-01 MED ORDER — MORPHINE BOLUS VIA INFUSION: 1.0000 mg | INTRAVENOUS | Status: DC | PRN

## 2023-07-01 MED ORDER — MORPHINE BOLUS VIA INFUSION
2.0000 mg | INTRAVENOUS | Status: DC | PRN
  Administered 2023-07-01 (×3): 2 mg via INTRAVENOUS

## 2023-07-01 MED ORDER — MORPHINE SULFATE (PF) 2 MG/ML IV SOLN
2.0000 mg | Freq: Once | INTRAVENOUS | Status: AC
  Administered 2023-07-01: 2 mg via INTRAVENOUS
  Filled 2023-07-01: qty 1

## 2023-07-01 MED ORDER — LORAZEPAM 2 MG/ML PO CONC: 1.0000 mg | ORAL | Status: DC | PRN

## 2023-07-10 LAB — MTB-RIF NAA WITH AFB CULTURE, SPUTUM

## 2023-07-10 NOTE — Progress Notes (Signed)
Daily Progress Note   Patient Name: Erin Wagner       Date: 06/30/2023 DOB: 1941-02-03  Age: 83 y.o. MRN#: [id number] Attending Physician: Alford Highland, MD Primary Care Physician: Erasmo Downer, MD Admit Date: 06/19/2023  Reason for Consultation/Follow-up: Establishing goals of care  Subjective: Notes and labs reviewed. In to see patient. She is sitting in bed on high flow cannula.  Her granddaughter whom she has been waiting for has arrived.  Currently she is in the room along with patient's son and daughter-in-law.  They discussed that they are ready to transition to comfort care at this time.  Yesterday interpreter [phone] was used and patient advised that she wanted her son to interpret for her including information regarding health care.  Spoke to patient with son interpreting.  He states she is stating she is ready to die.  Granddaughter and son are both stating that she is asking for 3 deities of [employer] to take her quickly, and patient herself looks up at the ceiling and holds her hands up, speaks to them for moment, and then does a flicking motion of her hand that is generally understood as "go away".  They states she has advised is tired of suffering and does not want to suffer any further.  I completed a MOST form today which was signed by patient's son as patient does not speak English and cannot read document, and is weak and frail.  And the signed original was placed in the chart. Each section of options on the form were reviewed in full detail and any questions were answered as needed. The form was scanned and sent to medical records for it to be uploaded under ACP tab in Epic. A photocopy was also placed in the chart to be scanned into EMR. The patient outlined their  wishes for the following treatment decisions:  Cardiopulmonary Resuscitation: Do Not Attempt Resuscitation (DNR/No CPR)  Medical Interventions: Comfort Measures: Keep clean, warm, and dry. Use medication by any route, positioning, wound care, and other measures to relieve pain and suffering. Use oxygen, suction and manual treatment of airway obstruction as needed for comfort. Do not transfer to the hospital unless comfort needs cannot be met in current location.  Antibiotics: No antibiotics (use other measures to relieve symptoms)  IV Fluids: No IV fluids (  provide other measures to ensure comfort)  Feeding Tube: No feeding tube    Length of Stay: 12  Current Medications: Scheduled Meds:   Chlorhexidine Gluconate Cloth  6 each Topical Daily   cloNIDine  0.1 mg Transdermal Weekly   dexamethasone (DECADRON) injection  8 mg Intravenous Q12H   furosemide  40 mg Intravenous Daily   ipratropium-albuterol  3 mL Nebulization BID   pantoprazole (PROTONIX) IV  40 mg Intravenous Q24H    Continuous Infusions:  morphine      PRN Meds: acetaminophen **OR** acetaminophen, antiseptic oral rinse, glycopyrrolate **OR** glycopyrrolate **OR** glycopyrrolate, haloperidol **OR** haloperidol **OR** haloperidol lactate, LORazepam **OR** LORazepam **OR** LORazepam, magnesium hydroxide, morphine, [DISCONTINUED] ondansetron **OR** ondansetron (ZOFRAN) IV, mouth rinse, polyvinyl alcohol, sodium chloride  Physical Exam Pulmonary:     Comments: On high flow cannula Neurological:     Mental Status: She is alert.             Vital Signs: BP (!) 141/67   Pulse (!) 123   Temp 97.6 F (36.4 C) (Axillary)   Resp (!) 29   Ht 4\' 3"  (1.295 m)   Wt 39.7 kg   SpO2 (!) 88%   BMI 23.66 kg/m  SpO2: SpO2: (!) 88 % O2 Device: O2 Device: Heated High Flow Nasal Cannula O2 Flow Rate: O2 Flow Rate (L/min): 45 L/min  Intake/output summary:  Intake/Output Summary (Last 24 hours) at 06/20/2023 1014 Last data filed at  06/13/2023 0800 Gross per 24 hour  Intake 0 ml  Output 1550 ml  Net -1550 ml   LBM: Last BM Date : 06/27/23 Baseline Weight: Weight: 46 kg Most recent weight: Weight: 39.7 kg     Patient Active Problem List   Diagnosis Date Noted   Pulmonary hypertension (HCC) 06/24/2023   Iron deficiency anemia 06/24/2023   Septic shock (HCC) 06/23/2023   Myocardial injury 06/23/2023   Uncontrolled type 2 diabetes mellitus with hyperglycemia, without long-term current use of insulin (HCC) 06/23/2023   Constipation 06/23/2023   Multifocal pneumonia 06/23/2023   Hyponatremia 06/20/2023   Essential hypertension 06/19/2023   Dyslipidemia 06/19/2023   GERD without esophagitis 06/19/2023   Interstitial lung disease (HCC) 06/19/2023   Acute hypoxemic respiratory failure (HCC) 06/19/2023   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   Urge incontinence 04/23/2023   ILD (interstitial lung disease) (HCC) 01/15/2023   Gait difficulty 01/15/2023   Chronic cough 12/06/2022   Bibasilar crackles 08/27/2022   Localized 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 fracture 02/22/2022   Hypothyroidism 05/13/2019   Essential tremor 05/13/2019   Allergic conjunctivitis of both eyes 05/13/2019   Lymphedema 12/13/2017   History of cataract 09/09/2017   Depression 09/09/2017   History of breast cancer 06/27/2017   Diabetes mellitus (HCC) 06/27/2017   Hyperlipidemia associated with type 2 diabetes mellitus (HCC) 06/27/2017   GERD (gastroesophageal reflux disease) 06/27/2017   Hypertension associated with diabetes (HCC) 06/27/2017    Palliative Care Assessment & Plan     Recommendations/Plan: Transition to comfort care.  Anticipate hospital death.  Attending updated via epic chat.  Primary RN updated.  Code Status:    Code Status Orders  (From admission, onward)           Start     Ordered   07/05/2023 1001  Do not attempt resuscitation (DNR) - Comfort care  Continuous       Question Answer  Comment  If patient has no pulse and is not breathing Do Not Attempt Resuscitation  In Pre-Arrest Conditions (Patient Is Breathing and Has a Pulse) Provide comfort measures. Relieve any mechanical airway obstruction. Avoid transfer unless required for comfort.   Consent: Discussion documented in EHR or advanced directives reviewed      06/12/2023 1005           Code Status History     Date Active Date Inactive Code Status Order ID Comments User Context   06/23/2023 1817 07/07/2023 1005 Limited: Do not attempt resuscitation (DNR) -DNR-LIMITED -Do Not Intubate/DNI  [id number]  Alford Highland, MD Inpatient   06/20/2023 0521 06/23/2023 1816 Do not attempt resuscitation (DNR) PRE-ARREST INTERVENTIONS DESIRED [id number]  Hannah Beat, MD ED   06/19/2023 2135 06/20/2023 0521 Full Code [id number]  Mansy, Vernetta Honey, MD ED       Prognosis:  Hours - Days    Thank you for allowing the Palliative Medicine Team to assist in the care of this patient.     Morton Stall, NP  Please contact Palliative Medicine Team phone at [phone] for questions and concerns.

## 2023-07-10 NOTE — Progress Notes (Addendum)
Pt on comfort care, time of death 07/17/2023. 1610. Son and family at bedside. Death pronounced by this RN and Levonne Spiller, RN. Dr. Renae Gloss notified.

## 2023-07-10 NOTE — Progress Notes (Addendum)
Progress Note   Patient: Erin Wagner NWG:[id number] DOB: [birth year] DOA: 06/19/2023     12 DOS: the patient was seen and examined on 06/22/2023   Brief hospital course: 83 year old female past medical history of breast cancer in remission, hypertension, hyperlipidemia, interstitial lung disease, hypothyroidism presented with acute onset cough and shortness of breath over the last 7 days.  She was on a recent steroid taper.  In the ER she was found to have hyponatremia, hyperkalemia, elevated liver function test, leukocytosis.  CT scan shows multifocal pneumonia with chronic interstitial lung disease.  12/15.  This morning was on 13 L high flow nasal cannula and progressed to heated high flow nasal cannula.  Case discussed with critical care specialist. One dose of lasix given. 12/16.  This morning patient was on heated high flow Nasal cannula 96% oxygen and +100% nonrebreather.  CT angiogram of the chest was negative for pulmonary embolism, background chronic interstitial lung disease and fibrosis and widespread groundglass density throughout the lungs 12/17.  Patient short of breath on 100% heated high flow nasal cannula and 100% nonrebreather.  As per Dr. Mayford Knife 12/18-12/22: Unfortunately, pt's respiratory status has not made much improvement. On IV levaquin, IV steroids, bronchodilators and pt still struggles to breath. Pt is waiting on her granddaughter to arrive from out of town tomorrow and will likely switch over to comfort care tomorrow.  12/23.  Patient converted to comfort care measures and started on morphine drip.  Respirations slowed down once morphine drip started.   Assessment and Plan: * End of life care Patient converted to comfort care measures today and started on morphine drip.  Patient already a DO NOT RESUSCITATE and DO NOT INTUBATE.   Acute hypoxemic respiratory failure (HCC) Patient still on heated high flow nasal cannula ranging between 77 and 82% on  oxygen.   Septic shock (HCC) Present on admission.  Lactic acid 4.7.  Secondary to multifocal pneumonia, tachypnea and leukocytosis.  Patient also has acute respiratory failure.  Patient completed antibiotics and steroids discontinued   Pulmonary hypertension (HCC) Echocardiogram shows moderate pulmonary hypertension which could be secondary to lung findings.   Interstitial lung disease (HCC) Patient made comfort care measures   Essential hypertension Off medications at this time  Hyponatremia Last sodium normal range   Hypothyroidism Synthroid discontinued   Myocardial injury Secondary to sepsis  Iron deficiency anemia Last hemoglobin 11.0 received 1 unit of transfusion during hospital course   Constipation   Uncontrolled type 2 diabetes mellitus with hyperglycemia, without long-term current use of insulin (HCC) Sliding scale discontinued   GERD without esophagitis On Protonix   Dyslipidemia Holding atorvastatin   Depression Zoloft discontinued   History of breast cancer       Subjective: Patient and family noticed that her respirations a little bit less labored while on morphine drip.  Admitted 11 days ago with shortness of breath and cough and treated for sepsis pneumonia and acute hypoxic respiratory failure.  Physical Exam: Vitals:   06/20/2023 0930 06/15/2023 0936 06/29/2023 1000 07/08/2023 1100  BP:   136/60 (!) 107/53  Pulse: (!) 124 (!) 123 (!) 121 (!) 124  Resp: (!) 28 (!) 29 (!) 23 (!) 29  Temp:      TempSrc:      SpO2: 90% (!) 88% 93% 91%  Weight:      Height:       Physical Exam HENT:     Head: Normocephalic.     Mouth/Throat:  Pharynx: No oropharyngeal exudate.  Eyes:     General: Lids are normal.     Conjunctiva/sclera: Conjunctivae normal.  Cardiovascular:     Rate and Rhythm: Normal rate and regular rhythm.     Heart sounds: Normal heart sounds, S1 normal and S2 normal.  Pulmonary:     Effort: No accessory muscle usage.     Breath  sounds: Examination of the right-middle field reveals decreased breath sounds. Examination of the left-middle field reveals decreased breath sounds. Examination of the right-lower field reveals decreased breath sounds and rhonchi. Examination of the left-lower field reveals decreased breath sounds and rhonchi. Decreased breath sounds and rhonchi present. No wheezing or rales.  Abdominal:     Palpations: Abdomen is soft.     Tenderness: There is no abdominal tenderness.  Musculoskeletal:     Right lower leg: No swelling.     Left lower leg: No swelling.  Skin:    General: Skin is warm.     Findings: No rash.  Neurological:     Mental Status: She is alert.     Data Reviewed: Creatinine 0.92, white blood cell count 20.4, hemoglobin 11.4, platelet count 447  Family Communication: Spoke with family at the bedside  Disposition: Status is: Inpatient Remains inpatient appropriate because: Converted to comfort care measures and started on morphine drip  Planned Discharge Destination: Patient will remain in the hospital on comfort care measures    Time spent: 27 minutes Case discussed with palliative care and pulmonary  Author: Alford Highland, MD 06/16/2023 11:45 AM  For on call review [email or web address].

## 2023-07-10 NOTE — Progress Notes (Signed)
Pt easily desats on little excertion. Pt is restig in bed on high flow. No c/o SOB durnig night.Grand son at bed side.Family desires plan to shift to comfort care today.Will continue to observe closely.

## 2023-07-10 NOTE — Progress Notes (Signed)
NAME:  KATERIA CHOQUETTE, MRN:  [id number], DOB:  Jun 01, 1941, LOS: 12 ADMISSION DATE:  06/19/2023  History of Present Illness:  83 year old female with history of ILD and non-CF bronchiectasis presenting to the hospital with increased shortness of breath and severe hypoxemia.     Pertinent  Medical History    Patient's past medical history includes breast cancer status post bilateral mastectomy and chemoradiation. She also has a history of depression and anxiety   Interim History / Subjective:    06/25/23- Did develop increased shortness of breath yesterday she is getting worse and is at high risk of death currently on maximal settings on HFNC 95%/60L/min .  Reviewed imaging with family  06/26/23- patient is improved, s/p pRBC transfusion now more stable able to speak and have weaned O2 to 80%.  Provided plant based diet. Lengthy discussion with family.  Palliative care consultation ordered.  06/27/23- patient had first meal after week of being on 100%HFNC and non rebreather.  After interval improvement and nourishemnent had developed electrolyte distrubances consistent with re-feeding.  Pharmacy consultation and repletion in process. She is on 85%HFNC without non-rebreather.  She is conversive.  Family at bedside I met with them few times today. Palliative meeting in process I encouraged patient to continue to work towards improvement as she expressed wishes to hopefully go back to [place] to see remainder of her family once more. We will continue to support her maximally on medical management.  06/28/23- patient with no overnight events.  Has abnormal respiratory cultures with eneterobacter.  Reviewed with pharmacist and will utilize levoquin per sensitivity panel.  FiO2 at 76%/40. Met with family multiple times for update and medical plan.  06/29/23- patient remains hypoxemic on HFNC, she was able to drink some fluids today.  She is not as tachypneic or tachycardic post metoprolol and low  dose clonidine patch initiated yesterday.  Met with family today multiple times. CRP treding down, remains on levofloxacin and decadron BID 06/30/23- patient started feeling better yesterday evening.  She started eating but reported dysphagia.  Her son explained this is not new, she had acute event after this and became acutely short of breath with increased O2 req.  Son stated he brought in from home a milk product for her to drink.  She may have aspirated during this meal.  We discussed this and he knows that she should only have hospital nourishment going forward.  Despite this she was able to speak in full sentences.  Her inflammatory biomarkers are trending down.  Her daughter is flying down from [place] to visit her.    Interim History / Subjective:  With increased work of breathing this am. Family members at bedside and expressed wishes to transition to comfort care only. Patient given morphine and wishes relayed to primary team.    Objective   Blood pressure (!) 73/47, pulse (!) 118, temperature 97.6 F (36.4 C), temperature source Axillary, resp. rate (!) 4, height 4\' 3"  (1.295 m), weight 39.7 kg, SpO2 91%.    FiO2 (%):  [71 %-80 %] 75 %   Intake/Output Summary (Last 24 hours) at 06/27/2023 1431 Last data filed at 06/11/2023 1227 Gross per 24 hour  Intake 164.37 ml  Output 1550 ml  Net -1385.63 ml   Filed Weights   06/29/23 0500 06/30/23 0400 06/13/2023 0500  Weight: 42.8 kg 39.7 kg 39.7 kg    GEN Moderate to severe respiratory distress.  HEENT Supple neck  CVS Tachy  Lungs shallow  breathing, in distress  GI soft non tender, non distended  Extremities Warm and well perfused  Labs and imaging were reviewed.   Assessment & Plan:  Unfortunate case of an 83 year old female patient here with acute hypoxic respiratory failure likely secondary to an undiagnosed ILD flare, refractory to therapy. With ongoing increased WOB, struggling, family wishes to transition to CMO. This was relayed  to primary team.   Janann Colonel, MD [facility] 06/13/2023 2:35 PM

## 2023-07-10 NOTE — Assessment & Plan Note (Addendum)
Patient converted to comfort care measures today and started on morphine drip.  Patient already a DO NOT RESUSCITATE and DO NOT INTUBATE.  Patient passed away on July 08, 2023 at 1629.

## 2023-07-10 NOTE — Death Summary Note (Signed)
DEATH SUMMARY   Patient Details  Name: Erin Wagner MRN: [id number] DOB: Aug 07, 1940 NWG:[id number], Marzella Schlein, MD Admission/Discharge Information   Admit Date:  12-Jul-2023  Date of Death: Date of Death: 24-Jul-2023  Time of Death: Time of Death: 1628/07/30  Length of Stay: 07-13-23   Principle Cause of death: interstitial lung disease  Hospital Diagnoses: Principal Problem:   End of life care Active Problems:   Acute hypoxemic respiratory failure (HCC)   Septic shock (HCC)   Interstitial lung disease (HCC)   Pulmonary hypertension (HCC)   Essential hypertension   Hyponatremia   Hypothyroidism   Myocardial injury   History of breast cancer   Depression   Dyslipidemia   GERD without esophagitis   Uncontrolled type 2 diabetes mellitus with hyperglycemia, without long-term current use of insulin (HCC)   Constipation   Multifocal pneumonia   Iron deficiency anemia   Hospital Course: 83 year old female past medical history of breast cancer in remission, hypertension, hyperlipidemia, interstitial lung disease, hypothyroidism presented with acute onset cough and shortness of breath over the last 7 days.  She was on a recent steroid taper.  In the ER she was found to have hyponatremia, hyperkalemia, elevated liver function test, leukocytosis.  CT scan shows multifocal pneumonia with chronic interstitial lung disease.  12/15.  This morning was on 13 L high flow nasal cannula and progressed to heated high flow nasal cannula.  Case discussed with critical care specialist. One dose of lasix given. 12/16.  This morning patient was on heated high flow Nasal cannula 96% oxygen and +100% nonrebreather.  CT angiogram of the chest was negative for pulmonary embolism, background chronic interstitial lung disease and fibrosis and widespread groundglass density throughout the lungs 12/17.  Patient short of breath on 100% heated high flow nasal cannula and 100% nonrebreather.  As per Dr. Mayford Knife  12/18-12/22: Unfortunately, pt's respiratory status has not made much improvement. On IV levaquin, IV steroids, bronchodilators and pt still struggles to breath. Pt is waiting on her granddaughter to arrive from out of town tomorrow and will likely switch over to comfort care tomorrow.  2023-07-24.  Patient converted to comfort care measures and started on morphine drip.  Respirations slowed down once morphine drip started.  Patient passed away on 07/24/23 at 1629.   Assessment and Plan: * End of life care Patient converted to comfort care measures today and started on morphine drip.  Patient already a DO NOT RESUSCITATE and DO NOT INTUBATE.  Patient passed away on 07-24-2023 at 1629.  Acute hypoxemic respiratory failure (HCC) Patient required heated high flow nasal cannula at 77 to 82% oxygen today.  Septic shock (HCC) Present on admission.  Lactic acid 4.7.  Secondary to multifocal pneumonia, tachypnea and leukocytosis.  Patient also has acute respiratory failure.  Patient completed antibiotics and steroids discontinued  Interstitial lung disease (HCC) Patient with exacerbation and did not improve with her oxygenation status during the 11 days in the hospital.  Pulmonary hypertension (HCC) Echocardiogram shows moderate pulmonary hypertension which could be secondary to lung findings.  Essential hypertension Off medications at this time  Hyponatremia Last sodium normal range  Hypothyroidism Synthroid discontinued  Myocardial injury Secondary to sepsis  Iron deficiency anemia Last hemoglobin 11.4.  Patient given a unit of blood on 12/17.  Constipation .  Uncontrolled type 2 diabetes mellitus with hyperglycemia, without long-term current use of insulin (HCC) Sliding scale discontinued  GERD without esophagitis On Protonix  Dyslipidemia Holding atorvastatin  Depression Zoloft discontinued  History of breast cancer .         Procedures: None  Consultations:  Critical care specialist  The results of significant diagnostics from this hospitalization (including imaging, microbiology, ancillary and laboratory) are listed below for reference.   Significant Diagnostic Studies: DG Chest Port 1 View Result Date: 06/30/2023 CLINICAL DATA:  Aspiration into airway. EXAM: PORTABLE CHEST 1 VIEW COMPARISON:  06/26/2023 FINDINGS: Shallow inspiration. Heart size is normal for technique. Diffuse bilateral airspace and interstitial infiltrates throughout the lungs, similar to prior study. This could represent edema, aspiration, or chronic interstitial lung disease. No pleural effusions. No pneumothorax. Mediastinal contours appear intact. Calcification of the aorta. IMPRESSION: Diffuse bilateral pulmonary infiltrates are unchanged since prior study. Electronically Signed   By: Burman Nieves M.D.   On: 06/30/2023 19:25   DG Chest Port 1 View Result Date: 06/26/2023 CLINICAL DATA:  Follow-up edema EXAM: PORTABLE CHEST 1 VIEW COMPARISON:  06/23/2023, CT 06/24/2023, 06/19/2023, 08/27/2022 FINDINGS: Stable cardiomediastinal silhouette. Underlying chronic interstitial lung disease. Overall no significant interval change in appearance of the chest since 06/23/2023 with widespread mild diffuse interstitial and ground-glass opacity. Aortic atherosclerosis. No pleural effusion or pneumothorax. IMPRESSION: No significant interval change in appearance of the chest since 06/23/2023. Underlying chronic interstitial lung disease and fibrosis with suspected superimposed mild diffuse interstitial and ground-glass opacity. Cardiomegaly. Electronically Signed   By: Jasmine Pang M.D.   On: 06/26/2023 19:30   CT Angio Chest Pulmonary Embolism (PE) W or WO Contrast Result Date: 06/24/2023 CLINICAL DATA:  Shortness of breath and cough EXAM: CT ANGIOGRAPHY CHEST WITH CONTRAST TECHNIQUE: Multidetector CT imaging of the chest was performed using the standard protocol during bolus administration  of intravenous contrast. Multiplanar CT image reconstructions and MIPs were obtained to evaluate the vascular anatomy. RADIATION DOSE REDUCTION: This exam was performed according to the departmental dose-optimization program which includes automated exposure control, adjustment of the mA and/or kV according to patient size and/or use of iterative reconstruction technique. CONTRAST:  75mL OMNIPAQUE IOHEXOL 350 MG/ML SOLN COMPARISON:  Chest x-ray 06/23/2023, CT chest 06/20/2023, 09/25/2022 FINDINGS: Cardiovascular: Satisfactory opacification of the pulmonary arteries to the segmental level. No evidence of pulmonary embolism. Moderate aortic atherosclerosis. No aneurysm or dissection. Mild cardiomegaly. No pericardial effusion. Mediastinum/Nodes: Patent trachea. No suspicious thyroid mass. Subcentimeter mediastinal lymph nodes. Enlarged subcarinal lymph node measuring 2.2 cm. Esophagus within normal limits. Lungs/Pleura: Background chronic interstitial lung disease and pulmonary fibrosis. Superimposed widespread ground-glass density, now more homogeneous throughout the lungs compared to the recent CT and less dense. No consolidation. Resolution of pleural effusions. Upper Abdomen: No acute finding. Musculoskeletal: Circumferential edema within the left upper extremity incompletely visualized. There are degenerative changes of the spine. No acute osseous abnormality. Bilateral mastectomies. Review of the MIP images confirms the above findings. IMPRESSION: 1. Negative for acute pulmonary embolus. 2. Background chronic interstitial lung disease and pulmonary fibrosis. Superimposed widespread ground-glass density, now more homogeneous throughout the lungs compared to the recent CT but less dense and consolidative in appearance. Findings could be due pulmonary edema, or improving pneumonia. There has been resolution of the previously noted bilateral pleural effusions. 3. Enlarged subcarinal lymph node, likely reactive. 4.  Circumferential edema within the left upper extremity incompletely visualized. 5. Aortic atherosclerosis. Aortic Atherosclerosis (ICD10-I70.0). Electronically Signed   By: Jasmine Pang M.D.   On: 06/24/2023 23:11   DG Chest Port 1 View Result Date: 06/23/2023 CLINICAL DATA:  [id number] Dyspnea [id number] EXAM: PORTABLE CHEST 1 VIEW COMPARISON:  06/19/2023 chest radiograph. FINDINGS:  Stable cardiomediastinal silhouette with mild cardiomegaly. No pneumothorax. No pleural effusion. Extensive background patchy reticular opacities throughout both lungs with associated architectural distortion, not appreciably changed. Previously described superimposed patchy indistinct opacities throughout both lungs are not substantially changed in the interval. IMPRESSION: No substantial change since 06/19/2023 radiograph. Patchy indistinct opacities throughout both lungs superimposed on underlying chronic fibrotic interstitial lung disease, which could represent superimposed pulmonary edema or infection. Electronically Signed   By: Delbert Phenix M.D.   On: 06/23/2023 17:00   US Abdomen Limited RUQ (LIVER/GB) Result Date: 06/21/2023 CLINICAL DATA:  Transaminitis EXAM: ULTRASOUND ABDOMEN LIMITED RIGHT UPPER QUADRANT COMPARISON:  CT 06/20/2023 FINDINGS: Gallbladder: No gallstones or wall thickening visualized. No sonographic Murphy sign noted by sonographer. Trace pericholecystic fluid. Common bile duct: Diameter: 3 mm Liver: No focal lesion identified. Within normal limits in parenchymal echogenicity. Portal vein is patent on color Doppler imaging with normal direction of blood flow towards the liver. Other: None. IMPRESSION: Negative for gallstones or biliary dilatation. Trace pericholecystic fluid but no other suspicious sonographic features to suggest cholecystitis. Electronically Signed   By: Jasmine Pang M.D.   On: 06/21/2023 15:47   ECHOCARDIOGRAM COMPLETE Result Date: 06/21/2023    ECHOCARDIOGRAM REPORT   Patient Name:    FRANNIE AVERY Date of Exam: 06/21/2023 Medical Rec #:  [id number]             Height:       51.0 in Accession #:    [id number]            Weight:       101.4 lb Date of Birth:  1940-09-22            BSA:          1.243 m Patient Age:    82 years              BP:           144/72 mmHg Patient Gender: F                     HR:           106 bpm. Exam Location:  [facility] Procedure: 2D Echo, Cardiac Doppler and Color Doppler Indications:     CHF  History:         Patient has no prior history of Echocardiogram examinations.                  CHF; Risk Factors:Hypertension, Diabetes and Dyslipidemia.                  Breast CA.  Sonographer:     Mikki Harbor Referring Phys:  [id number] BRENDA MORRISON Diagnosing Phys: Debbe Odea MD  Sonographer Comments: Image acquisition challenging due to respiratory motion. IMPRESSIONS  1. Left ventricular ejection fraction, by estimation, is 60 to 65%. The left ventricle has normal function. The left ventricle has no regional wall motion abnormalities. Left ventricular diastolic parameters are indeterminate.  2. Right ventricular systolic function is mildly reduced. The right ventricular size is mildly enlarged. There is moderately elevated pulmonary artery systolic pressure. The estimated right ventricular systolic pressure is 50.0 mmHg.  3. The mitral valve is normal in structure. Mild mitral valve regurgitation.  4. Tricuspid valve regurgitation is moderate to severe.  5. The aortic valve is tricuspid. Aortic valve regurgitation is mild. Aortic valve sclerosis is present, with no evidence of aortic valve stenosis.  6. The inferior vena cava is normal  in size with <50% respiratory variability, suggesting right atrial pressure of 8 mmHg. FINDINGS  Left Ventricle: Left ventricular ejection fraction, by estimation, is 60 to 65%. The left ventricle has normal function. The left ventricle has no regional wall motion abnormalities. The left ventricular internal cavity size was  normal in size. There is  no left ventricular hypertrophy. Left ventricular diastolic parameters are indeterminate. Right Ventricle: The right ventricular size is mildly enlarged. No increase in right ventricular wall thickness. Right ventricular systolic function is mildly reduced. There is moderately elevated pulmonary artery systolic pressure. The tricuspid regurgitant velocity is 3.24 m/s, and with an assumed right atrial pressure of 8 mmHg, the estimated right ventricular systolic pressure is 50.0 mmHg. Left Atrium: Left atrial size was normal in size. Right Atrium: Right atrial size was normal in size. Pericardium: There is no evidence of pericardial effusion. Mitral Valve: The mitral valve is normal in structure. Mild mitral valve regurgitation. MV peak gradient, 7.4 mmHg. The mean mitral valve gradient is 3.0 mmHg. Tricuspid Valve: The tricuspid valve is normal in structure. Tricuspid valve regurgitation is moderate to severe. Aortic Valve: The aortic valve is tricuspid. Aortic valve regurgitation is mild. Aortic regurgitation PHT measures 308 msec. Aortic valve sclerosis is present, with no evidence of aortic valve stenosis. Aortic valve mean gradient measures 4.0 mmHg. Aortic valve peak gradient measures 8.2 mmHg. Aortic valve area, by VTI measures 1.59 cm. Pulmonic Valve: The pulmonic valve was normal in structure. Pulmonic valve regurgitation is trivial. Aorta: The aortic root and ascending aorta are structurally normal, with no evidence of dilitation. Venous: The inferior vena cava is normal in size with less than 50% respiratory variability, suggesting right atrial pressure of 8 mmHg. IAS/Shunts: No atrial level shunt detected by color flow Doppler.  LEFT VENTRICLE PLAX 2D LVIDd:         2.90 cm LVIDs:         1.70 cm LV PW:         0.90 cm LV IVS:        1.00 cm LVOT diam:     1.60 cm LV SV:         47 LV SV Index:   38 LVOT Area:     2.01 cm  RIGHT VENTRICLE RV Basal diam:  3.30 cm RV Mid diam:     3.70 cm RV S prime:     8.38 cm/s LEFT ATRIUM             Index        RIGHT ATRIUM           Index LA diam:        3.20 cm 2.57 cm/m   RA Area:     15.70 cm LA Vol (A2C):   42.7 ml 34.36 ml/m  RA Volume:   39.00 ml  31.38 ml/m LA Vol (A4C):   20.2 ml 16.25 ml/m LA Biplane Vol: 30.7 ml 24.70 ml/m  AORTIC VALVE                    PULMONIC VALVE AV Area (Vmax):    1.55 cm     PV Vmax:       0.81 m/s AV Area (Vmean):   1.38 cm     PV Peak grad:  2.6 mmHg AV Area (VTI):     1.59 cm AV Vmax:           143.00 cm/s AV Vmean:  99.500 cm/s AV VTI:            0.298 m AV Peak Grad:      8.2 mmHg AV Mean Grad:      4.0 mmHg LVOT Vmax:         110.00 cm/s LVOT Vmean:        68.300 cm/s LVOT VTI:          0.235 m LVOT/AV VTI ratio: 0.79 AI PHT:            308 msec  AORTA Ao Root diam: 2.90 cm Ao Asc diam:  3.20 cm MITRAL VALVE                TRICUSPID VALVE MV Area (PHT): 5.46 cm     TR Peak grad:   42.0 mmHg MV Area VTI:   2.67 cm     TR Vmax:        324.00 cm/s MV Peak grad:  7.4 mmHg MV Mean grad:  3.0 mmHg     SHUNTS MV Vmax:       1.36 m/s     Systemic VTI:  0.24 m MV Vmean:      75.2 cm/s    Systemic Diam: 1.60 cm MV Decel Time: 139 msec MV E velocity: 124.00 cm/s Debbe Odea MD Electronically signed by Debbe Odea MD Signature Date/Time: 06/21/2023/12:56:20 PM    Final    CT Angio Abd/Pel w/ and/or w/o Result Date: 06/20/2023 CLINICAL DATA:  Mesenteric ischemia. History of breast cancer diabetes and hypertension. EXAM: CTA ABDOMEN AND PELVIS WITHOUT AND WITH CONTRAST TECHNIQUE: Multidetector CT imaging of the abdomen and pelvis was performed using the standard protocol during bolus administration of intravenous contrast. Multiplanar reconstructed images and MIPs were obtained and reviewed to evaluate the vascular anatomy. RADIATION DOSE REDUCTION: This exam was performed according to the departmental dose-optimization program which includes automated exposure control, adjustment of the mA  and/or kV according to patient size and/or use of iterative reconstruction technique. CONTRAST:  OMNIPAQUE IOHEXOL 350 MG/ML SOLN COMPARISON:  Chest CT dated 06/20/2023 and CT abdomen pelvis dated 02/06/2018. FINDINGS: VASCULAR Aorta: Mild atherosclerotic calcification. No aneurysmal dilatation or dissection. No periaortic fluid collection. Celiac: Patent without evidence of aneurysm, dissection, vasculitis or significant stenosis. SMA: Patent without evidence of aneurysm, dissection, vasculitis or significant stenosis. Renals: Atherosclerotic calcification of the origins of the renal arteries. The renal arteries are otherwise patent. IMA: The IMA is patent. Inflow: Mild atherosclerotic calcification of the iliac arteries. The iliac arteries are patent. No aneurysmal dilatation or dissection. Proximal Outflow: The visualized proximal of fluid is patent. Veins: The IVC is unremarkable. The SMV, splenic vein, and main portal vein are patent. No portal venous gas. Review of the MIP images confirms the above findings. NON-VASCULAR Lower chest: See chest CT report. No intra-abdominal free air or free fluid. Hepatobiliary: The liver is unremarkable. There is trace perihepatic fluid. No calcified gallstone. Ultrasound may provide better evaluation if there is clinical concern for for acute gallbladder pathology. Pancreas: Unremarkable. No pancreatic ductal dilatation or surrounding inflammatory changes. Spleen: Normal in size without focal abnormality. Adrenals/Urinary Tract: The adrenal glands are unremarkable. There is a 4.5 cm right renal upper pole cyst. There is no hydronephrosis on either side. The visualized ureters and urinary bladder appear unremarkable. Stomach/Bowel: Small hiatal hernia. There is no bowel obstruction or active inflammation. The appendix is normal. Lymphatic: No adenopathy. Reproductive: The uterus is grossly unremarkable. No adnexal masses. 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 No acute osseous pathology. Degenerative changes. IMPRESSION: 1. No acute intra-abdominal or pelvic pathology. No CT evidence of mesenteric ischemia. 2. Small hiatal hernia. No bowel obstruction. Normal appendix. Electronically Signed   By: Elgie Collard M.D.   On: 06/20/2023 20:49   CT CHEST WO CONTRAST Result Date: 06/20/2023 CLINICAL DATA:  Respiratory failure.  Interstitial lung disease EXAM: CT CHEST WITHOUT CONTRAST TECHNIQUE: Multidetector CT imaging of the chest was performed following the standard protocol without IV contrast. RADIATION DOSE REDUCTION: This exam was performed according to the departmental dose-optimization program which includes automated exposure control, adjustment of the mA and/or kV according to patient size and/or use of iterative reconstruction technique. COMPARISON:  X-ray 06/19/2023, CT 03/12/2023 FINDINGS: Cardiovascular: Mild cardiomegaly. No pericardial effusion. Relative hypoattenuation of the cardiac blood pool indicative of anemia. Thoracic aorta is nonaneurysmal. Scattered atherosclerotic vascular calcifications of the aorta and coronary arteries. Central pulmonary vasculature is within normal limits. Mediastinum/Nodes: No lymphadenopathy is seen. Tiny hiatal hernia. Thyroid gland and trachea within normal limits. Lungs/Pleura: Extensive airspace consolidations throughout both lungs. 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 Trace bilateral pleural effusions. No pneumothorax. Upper Abdomen: No acute abnormality. Musculoskeletal: Postsurgical changes to the anterior chest wall from bilateral mastectomies with a similar degree of postoperative scarring. No acute osseous abnormality or suspicious bone lesions identified. IMPRESSION: 1. Extensive airspace consolidations throughout both lungs, most compatible  with multifocal pneumonia. 2. Background of pulmonary fibrosis. 3. Trace bilateral pleural effusions. 4. Mild cardiomegaly. 5. Aortic and coronary artery atherosclerosis (ICD10-I70.0). Electronically Signed   By: Duanne Guess D.O.   On: 06/20/2023 12:13   DG Chest Port 1 View Result Date: 06/19/2023 CLINICAL DATA:  Shortness of breath, weakness, and cough EXAM: PORTABLE CHEST 1 VIEW COMPARISON:  Chest radiograph dated 08/27/2022 FINDINGS: Normal lung volumes. Bilateral peripheral reticulations with diffusely increased patchy opacities, left-greater-than-right. No pleural effusion or pneumothorax. Mildly enlarged cardiomediastinal silhouette. no acute osseous abnormality. IMPRESSION: 1. Bilateral peripheral reticulations with diffusely increased patchy opacities, left-greater-than-right, which may represent atypical infection superimposed on chronic interstitial lung disease. 2. Mild cardiomegaly. Electronically Signed   By: Agustin Cree M.D.   On: 06/19/2023 19:43    Microbiology: Recent Results (from the past 240 hours)  Aspergillus Ag, BAL/Serum     Status: None   Collection Time: 06/23/23 11:37 AM   Specimen: Vein  Result Value Ref Range Status   Aspergillus Ag, BAL/Serum 0.04 0.00 - 0.49 Index Final    Comment: (NOTE) Performed At: [facility] [address] [place], [place] [id number] Jolene Schimke MD QM:[id number]   Acid Fast Smear (AFB)     Status: None   Collection Time: 06/25/23  9:44 AM   Specimen: Sputum  Result Value Ref Range Status   AFB Specimen Processing Concentration  Final   Acid Fast Smear Negative  Final    Comment: (NOTE) Performed At: [facility] [address] [place], [place] [id number] Jolene Schimke MD NU:[id number]    Source (AFB) EXPECTORATED SPUTUM  Final    Comment: Performed at [facility], [address] [place], [place] [address]  Expectorated Sputum Assessment w Gram Stain, Rflx to Resp Cult     Status: None    Collection Time: 06/25/23  9:44 AM   Specimen: SPU  Result Value Ref Range Status   Specimen Description SPUTUM  Final  Special Requests NONE  Final   Sputum evaluation   Final    THIS SPECIMEN IS ACCEPTABLE FOR SPUTUM CULTURE Performed at [facility], [address] [place] [place], [place] [address]    Report Status 06/25/2023 FINAL  Final  Culture, Respiratory w Gram Stain     Status: None   Collection Time: 06/25/23  9:44 AM   Specimen: SPU  Result Value Ref Range Status   Specimen Description   Final    SPUTUM Performed at [facility], [address] [place], [place] [address]    Special Requests   Final    NONE Performed at [facility], [address] [place], [place] [address]    Gram Stain   Final    NO WBC SEEN RARE GRAM POSITIVE COCCI IN PAIRS Performed at [facility], [address], [place], [place] [address]    Culture   Final    FEW ENTEROBACTER CLOACAE MODERATE STAPHYLOCOCCUS EPIDERMIDIS    Report Status 06/29/2023 FINAL  Final   Organism ID, Bacteria ENTEROBACTER CLOACAE  Final   Organism ID, Bacteria STAPHYLOCOCCUS EPIDERMIDIS  Final      Susceptibility   Enterobacter cloacae - MIC*    CEFEPIME <=0.12 SENSITIVE Sensitive     CEFTAZIDIME <=1 SENSITIVE Sensitive     CIPROFLOXACIN <=0.25 SENSITIVE Sensitive     GENTAMICIN <=1 SENSITIVE Sensitive     IMIPENEM <=0.25 SENSITIVE Sensitive     TRIMETH/SULFA <=20 SENSITIVE Sensitive     PIP/TAZO <=4 SENSITIVE Sensitive ug/mL    * FEW ENTEROBACTER CLOACAE   Staphylococcus epidermidis - MIC*    CIPROFLOXACIN <=0.5 SENSITIVE Sensitive     ERYTHROMYCIN >=8 RESISTANT Resistant     GENTAMICIN <=0.5 SENSITIVE Sensitive     OXACILLIN >=4 RESISTANT Resistant     TETRACYCLINE 2 SENSITIVE Sensitive     VANCOMYCIN 1 SENSITIVE Sensitive     TRIMETH/SULFA 160 RESISTANT Resistant     CLINDAMYCIN <=0.25 SENSITIVE Sensitive     RIFAMPIN <=0.5 SENSITIVE Sensitive     Inducible  Clindamycin NEGATIVE Sensitive     * MODERATE STAPHYLOCOCCUS EPIDERMIDIS  MTB-RIF NAA with AFB Culture, sputum (q8 x 3)     Status: None (Preliminary result)   Collection Time: 06/26/23  9:48 AM   Specimen: Sputum  Result Value Ref Range Status   Myco tuberculosis Complex NOT DETECTED NOT DETECTED Final   Rifampin Not applicable NOT DETECTED Final   AFB Specimen Processing Concentration  Final    Comment: (NOTE) Performed At: [facility] [address] [place], [place] [id number] Jolene Schimke MD RK:[id number]    Acid Fast Culture PENDING  Incomplete   Source (MTB RIF) EXPECTORATED SPUTUM  Final    Comment: Performed at [facility], [address], [place], [place] [id number]    Time spent: 30 minutes  Signed: Alford Highland, MD 06/17/2023

## 2023-07-10 NOTE — TOC CM/SW Note (Signed)
Transition of Care [facility]) - Inpatient Brief Assessment   Patient Details  Name: Erin Wagner MRN: [id number] Date of Birth: 09/19/1940  Transition of Care [facility]) CM/SW Contact:    Margarito Liner, LCSW Phone Number: 07/05/2023, 1:49 PM   Clinical Narrative: CSW reviewed chart. Patient currently on comfort care measures. Anticipated hospital death. No TOC needs identified at this time.  Transition of Care Asessment: Insurance and Status: Insurance coverage has been reviewed Patient has primary care physician: Yes Home environment has been reviewed: Single family home Prior level of function:: Not documented Prior/Current Home Services: No current home services Social Drivers of Health Review: SDOH reviewed no interventions necessary Readmission risk has been reviewed: Yes Transition of care needs: no transition of care needs at this time

## 2023-07-10 NOTE — Plan of Care (Signed)
  Problem: Education: Goal: Ability to describe self-care measures that may prevent or decrease complications (Diabetes Survival Skills Education) will improve Outcome: Progressing   Problem: Skin Integrity: Goal: Risk for impaired skin integrity will decrease Outcome: Progressing   Problem: Tissue Perfusion: Goal: Adequacy of tissue perfusion will improve Outcome: Progressing   Problem: Respiratory: Goal: Ability to maintain adequate ventilation will improve Outcome: Not Progressing

## 2023-07-10 DEATH — deceased

## 2023-08-11 LAB — MTB-RIF NAA WITH AFB CULTURE, SPUTUM
Acid Fast Culture: NEGATIVE
Myco tuberculosis Complex: NOT DETECTED

## 2023-08-21 LAB — SLOW GROWER BROTH SUSCEP.
Ciprofloxacin: >8
Clarithromycin: 1
Doxycycline: 8
Minocycline: >8
Moxifloxacin: >4
Rifampin: 4
Streptomycin: >32

## 2023-08-21 LAB — ACID FAST ID BY PCR AND SUSCEPTIBILITIES
M Tuberculosis Complex: NEGATIVE
M avium complex: NEGATIVE

## 2023-08-21 LAB — ORGANISM ID BY MALDI

## 2023-08-21 LAB — ACID FAST CULTURE WITH REFLEXED SENSITIVITIES (MYCOBACTERIA): Acid Fast Culture: POSITIVE — AB

## 2023-08-21 LAB — MYCOBACTERIA ID BY MALDI

## 2023-09-02 ENCOUNTER — Encounter: Payer: Medicaid Other | Admitting: Family Medicine

## 2023-09-09 ENCOUNTER — Other Ambulatory Visit: Payer: Medicaid Other

## 2024-04-21 ENCOUNTER — Ambulatory Visit: Payer: Medicaid Other | Admitting: Oncology
